# Patient Record
Sex: Male | Born: 1960 | Race: White | Hispanic: No | Marital: Married | Smoking: Never smoker
Health system: Southern US, Community
[De-identification: ages and names within clinical notes are randomized; demographics above are authoritative.]

## PROBLEM LIST (undated history)

## (undated) DIAGNOSIS — K219 Gastro-esophageal reflux disease without esophagitis: Secondary | ICD-10-CM

## (undated) DIAGNOSIS — I10 Essential (primary) hypertension: Secondary | ICD-10-CM

## (undated) DIAGNOSIS — E119 Type 2 diabetes mellitus without complications: Secondary | ICD-10-CM

## (undated) DIAGNOSIS — N189 Chronic kidney disease, unspecified: Secondary | ICD-10-CM

## (undated) HISTORY — PX: CHOLECYSTECTOMY: SHX55

## (undated) HISTORY — PX: BACK SURGERY: SHX140

## (undated) HISTORY — PX: EYE SURGERY: SHX253

## (undated) HISTORY — PX: HERNIA REPAIR: SHX51

---

## 2008-03-17 ENCOUNTER — Ambulatory Visit: Payer: Self-pay | Admitting: Gastroenterology

## 2011-03-03 DIAGNOSIS — F4320 Adjustment disorder, unspecified: Secondary | ICD-10-CM | POA: Insufficient documentation

## 2011-08-21 ENCOUNTER — Ambulatory Visit: Payer: Self-pay | Admitting: Family Medicine

## 2011-08-23 DIAGNOSIS — H269 Unspecified cataract: Secondary | ICD-10-CM | POA: Insufficient documentation

## 2012-04-26 DIAGNOSIS — I2510 Atherosclerotic heart disease of native coronary artery without angina pectoris: Secondary | ICD-10-CM | POA: Insufficient documentation

## 2012-05-07 DIAGNOSIS — R9439 Abnormal result of other cardiovascular function study: Secondary | ICD-10-CM | POA: Insufficient documentation

## 2012-05-30 DIAGNOSIS — N183 Chronic kidney disease, stage 3 unspecified: Secondary | ICD-10-CM | POA: Insufficient documentation

## 2015-10-03 DIAGNOSIS — K859 Acute pancreatitis without necrosis or infection, unspecified: Secondary | ICD-10-CM | POA: Insufficient documentation

## 2015-11-23 DIAGNOSIS — K2940 Chronic atrophic gastritis without bleeding: Secondary | ICD-10-CM | POA: Insufficient documentation

## 2017-07-18 DIAGNOSIS — H2513 Age-related nuclear cataract, bilateral: Secondary | ICD-10-CM | POA: Insufficient documentation

## 2018-01-06 ENCOUNTER — Emergency Department
Admission: EM | Admit: 2018-01-06 | Discharge: 2018-01-06 | Disposition: A | Discharge status: Home / Self Care | Payer: BLUE CROSS/BLUE SHIELD | Attending: Emergency Medicine | Admitting: Emergency Medicine

## 2018-01-06 ENCOUNTER — Other Ambulatory Visit: Payer: Self-pay

## 2018-01-06 ENCOUNTER — Encounter: Payer: Self-pay | Admitting: Emergency Medicine

## 2018-01-06 ENCOUNTER — Emergency Department: Payer: BLUE CROSS/BLUE SHIELD

## 2018-01-06 DIAGNOSIS — R55 Syncope and collapse: Secondary | ICD-10-CM

## 2018-01-06 DIAGNOSIS — E119 Type 2 diabetes mellitus without complications: Secondary | ICD-10-CM | POA: Diagnosis not present

## 2018-01-06 DIAGNOSIS — R0609 Other forms of dyspnea: Secondary | ICD-10-CM | POA: Diagnosis not present

## 2018-01-06 DIAGNOSIS — E869 Volume depletion, unspecified: Secondary | ICD-10-CM | POA: Diagnosis not present

## 2018-01-06 DIAGNOSIS — I10 Essential (primary) hypertension: Secondary | ICD-10-CM | POA: Insufficient documentation

## 2018-01-06 HISTORY — DX: Type 2 diabetes mellitus without complications: E11.9

## 2018-01-06 HISTORY — DX: Essential (primary) hypertension: I10

## 2018-01-06 LAB — CBC WITH DIFFERENTIAL/PLATELET
BASOS ABS: 0 10*3/uL (ref 0–0.1)
BASOS PCT: 0 %
EOS PCT: 1 %
Eosinophils Absolute: 0.1 10*3/uL (ref 0–0.7)
HCT: 38.6 % — ABNORMAL LOW (ref 40.0–52.0)
Hemoglobin: 13.8 g/dL (ref 13.0–18.0)
LYMPHS PCT: 21 %
Lymphs Abs: 1.3 10*3/uL (ref 1.0–3.6)
MCH: 32.9 pg (ref 26.0–34.0)
MCHC: 35.8 g/dL (ref 32.0–36.0)
MCV: 92 fL (ref 80.0–100.0)
MONO ABS: 0.5 10*3/uL (ref 0.2–1.0)
Monocytes Relative: 8 %
Neutro Abs: 4.4 10*3/uL (ref 1.4–6.5)
Neutrophils Relative %: 70 %
Platelets: 151 10*3/uL (ref 150–440)
RBC: 4.19 MIL/uL — ABNORMAL LOW (ref 4.40–5.90)
RDW: 12.4 % (ref 11.5–14.5)
WBC: 6.4 10*3/uL (ref 3.8–10.6)

## 2018-01-06 LAB — LACTIC ACID, PLASMA
Lactic Acid, Venous: 2.3 mmol/L (ref 0.5–1.9)
Lactic Acid, Venous: 1.8 mmol/L (ref 0.5–1.9)

## 2018-01-06 LAB — TROPONIN I
Troponin I: <0.03 ng/mL (ref <0.03)
Troponin I: <0.03 ng/mL (ref <0.03)

## 2018-01-06 LAB — BRAIN NATRIURETIC PEPTIDE: B NATRIURETIC PEPTIDE 5: 23 pg/mL (ref 0.0–100.0)

## 2018-01-06 LAB — CK: Total CK: 140 U/L (ref 49–397)

## 2018-01-06 MED ORDER — SODIUM CHLORIDE 0.9 % IV BOLUS
500.0000 mL | Freq: Once | INTRAVENOUS | Status: AC
  Administered 2018-01-06: 500 mL via INTRAVENOUS

## 2018-01-06 NOTE — Discharge Instructions (Addendum)
You have been seen today in the Emergency Department (ED)  for shortness of breath and near syncope (almost passing out).  Your workup including labs and EKG show reassuring results.  Your symptoms may be due to mild dehydration, so it is important that you drink plenty of non-alcoholic fluids.  Please call your regular doctor as soon as possible to schedule the next available clinic appointment to follow up with him/her regarding your visit to the ED and your symptoms.  Return to the Emergency Department (ED)  if you have any further syncopal episodes (pass out again) or develop ANY chest pain, pressure, tightness, trouble breathing, sudden sweating, or other symptoms that concern you.

## 2018-01-06 NOTE — ED Provider Notes (Signed)
[facility] Emergency Department Provider Note  ____________________________________________   First MD Initiated Contact with Patient 01/06/18 1808     (approximate)  I have reviewed the triage vital signs and the nursing notes.   HISTORY  Chief Complaint Near Syncope    HPI Troy Morse is a 57 y.o. male with medical history as listed below who presents by EMS for evaluation of shortness of breath and near syncope.  He reports that he climbed 2 flights of stairs to do some work on a tenants HVAC system and by the time he got to the top he was very short of breath and felt dizzy and lightheaded like he was going to pass out.  He had associated shortness of breath.  At no point has he had any chest pain.  He reports that the symptoms were severe but got better after EMS arrived and he cooled down and got some IV fluids.  He says that he has been eating and drinking normally but he has been doing some work outside today and yesterday.  He denies any recent illness and specifically denies fever/chills, chest pain, nausea, vomiting, abdominal pain, and dysuria.  Exertion seem to make the symptoms worse and resting and cooling off made it better.  He reports that a couple of years ago he was hospitalized for medication induced pancreatitis.  He reportedly failed a stress test and had a catheterization but did not receive a stent for a reported 35% blockage.  He has not followed up with his [facility] cardiologist since that time.  But he also reports that he does not have regular issues including chest pain  Past Medical History:  Diagnosis Date  . Diabetes mellitus without complication (HCC)   . Hypertension     There are no active problems to display for this patient.   Past Surgical History:  Procedure Laterality Date  . BACK SURGERY    . CHOLECYSTECTOMY    . HERNIA REPAIR      Prior to Admission medications   Not on File    Allergies Patient has no  known allergies.  No family history on file.  Social History Social History   Tobacco Use  . Smoking status: Never Smoker  . Smokeless tobacco: Never Used  Substance Use Topics  . Alcohol use: Not Currently  . Drug use: Never    Review of Systems Constitutional: No fever/chills Eyes: No visual changes. ENT: No sore throat. Cardiovascular: Denies chest pain.  Near syncope after climbing stairs. Respiratory: Shortness of breath with exertion Gastrointestinal: No abdominal pain.  No nausea, no vomiting.  No diarrhea.  No constipation. Genitourinary: Negative for dysuria. Musculoskeletal: Negative for neck pain.  Negative for back pain. Integumentary: Negative for rash. Neurological: Negative for headaches, focal weakness or numbness.   ____________________________________________   PHYSICAL EXAM:  VITAL SIGNS: ED Triage Vitals  Enc Vitals Group     BP 01/06/18 1758 (!) 151/83     Pulse Rate 01/06/18 1758 69     Resp 01/06/18 1758 16     Temp --      Temp src --      SpO2 01/06/18 1758 97 %     Weight 01/06/18 1759 93 kg (205 lb)     Height 01/06/18 1759 1.778 m (5\' 10" )     Head Circumference --      Peak Flow --      Pain Score 01/06/18 1759 0     Pain  Loc --      Pain Edu? --      Excl. in GC? --     Constitutional: Alert and oriented. Well appearing and in no acute distress. Eyes: Conjunctivae are normal.  Head: Atraumatic. Nose: No congestion/rhinnorhea. Mouth/Throat: Mucous membranes are moist. Neck: No stridor.  No meningeal signs.   Cardiovascular: Normal rate, regular rhythm. Good peripheral circulation. Grossly normal heart sounds. Respiratory: Normal respiratory effort.  No retractions. Lungs CTAB. Gastrointestinal: Soft and nontender. No distention.  Musculoskeletal: No lower extremity tenderness nor edema. No gross deformities of extremities. Neurologic:  Normal speech and language. No gross focal neurologic deficits are appreciated.  Skin:   Skin is warm, dry and intact. No rash noted. Psychiatric: Mood and affect are normal. Speech and behavior are normal.  ____________________________________________   LABS (all labs ordered are listed, but only abnormal results are displayed)  Labs Reviewed  CBC WITH DIFFERENTIAL/PLATELET - Abnormal; Notable for the following components:      Result Value   RBC 4.19 (*)    HCT 38.6 (*)    All other components within normal limits  LACTIC ACID, PLASMA - Abnormal; Notable for the following components:   Lactic Acid, Venous 2.3 (*)    All other components within normal limits  TROPONIN I  CK  BRAIN NATRIURETIC PEPTIDE  LACTIC ACID, PLASMA  TROPONIN I   ____________________________________________  EKG  ED ECG REPORT I, Loleta Rose, the attending physician, personally viewed and interpreted this ECG.  Date: 01/06/2018 EKG Time: 17: 52 Rate: 67 Rhythm: normal sinus rhythm QRS Axis: normal Intervals: normal ST/T Wave abnormalities: Patient has some ST segment elevation in leads I, 2, V4, but does not meet criteria for STEMI. Narrative Interpretation: no evidence of acute ischemia   ____________________________________________  RADIOLOGY I, Loleta Rose, personally viewed and evaluated these images (plain radiographs) as part of my medical decision making, as well as reviewing the written report by the radiologist.  ED MD interpretation: No indication of intrathoracic abnormality on chest x-ray  Official radiology report(s): Dg Chest Portable 1 View  Result Date: 01/06/2018 CLINICAL DATA:  Shortness of breath EXAM: PORTABLE CHEST 1 VIEW COMPARISON:  08/21/2011 FINDINGS: Heart is borderline in size. Low lung volumes with minimal bibasilar atelectasis. No effusions or edema. No acute bony abnormality. IMPRESSION: Borderline heart size.  Low lung volumes with bibasilar atelectasis. Electronically Signed   By: Charlett Nose M.D.   On: 01/06/2018 19:04     ____________________________________________   PROCEDURES  Critical Care performed: No   Procedure(s) performed:   Procedures   ____________________________________________   INITIAL IMPRESSION / ASSESSMENT AND PLAN / ED COURSE  As part of my medical decision making, I reviewed the following data within the electronic MEDICAL RECORD NUMBER Nursing notes reviewed and incorporated, Labs reviewed , EKG interpreted , Old chart reviewed and Radiograph reviewed     Differential diagnosis includes, but is not limited to, volume depletion/dehydration, ACS, pneumonia, PE, electrolyte or metabolic abnormality.  The patient appears volume depleted to me although ACS is also certainly possible.  I anticipate his lactic acid may be slightly elevated but he has no signs or symptoms of sepsis and no recent infectious symptoms.  He does not meet sepsis criteria.  He has some nonspecific changes in the EKG which I do not feel represents acute infection or inflammation; even though the computer is reading Global ST segment elevation consistent with pericarditis, he has no signs or symptoms of pericarditis, and I think that  he has some early repolarization but no evidence of acute ischemia.  The patient feels much better after getting some IV fluids.  I will continue the fluids and check extensive lab work but I believe that his symptoms are the result of volume depletion at this time.  He understands and agrees with the plan for at least 2 troponins and continued lab work-up and observation, likely discharge home.  He reports that [facility] with whom he would like to follow-up.  Clinical Course as of Jan 08 27  Wynelle LinkSun Jan 06, 2018  1852 Lactic Acid, Venous(!!): 2.3 [CF]  1852 CK Total: 140 [CF]  1852 Troponin I: <0.03 [CF]  2026 No acute abnormalities  DG Chest Portable 1 View [CF]  2135 Lactic Acid, Venous: 1.8 [CF]  2135 Troponin I: <0.03 [CF]  2142 B Natriuretic Peptide: 23.0  [CF]  2144 The patient has been asymptomatic for 4 hours in the emergency department.  His repeat lactic acid was normal and his second troponin was negative.  Every now and then on the monitor he has had some PVCs that appears almost as a trigeminy pattern.  I informed him and his wife of this but explained that this is generally not dangerous although it could have contributed to his shortness of breath and therapy.  He and his wife promised that they will call his cardiologist tomorrow and I am also providing the name and number of a local cardiologist with whom he can follow-up.  He is comfortable with the plan and I see no acute or emergent reason to keep him in the hospital tonight.  I gave strict return precautions and he and his wife understand and agree with the plan.   [CF]    Clinical Course User Index [CF] Loleta RoseForbach, Jolina Symonds, MD    ____________________________________________  FINAL CLINICAL IMPRESSION(S) / ED DIAGNOSES  Final diagnoses:  Near syncope  Dyspnea on exertion  Volume depletion     MEDICATIONS GIVEN DURING THIS VISIT:  Medications  sodium chloride 0.9 % bolus 500 mL (0 mLs Intravenous Stopped 01/06/18 1926)  sodium chloride 0.9 % bolus 500 mL (0 mLs Intravenous Stopped 01/06/18 2030)     ED Discharge Orders    None       Note:  This document was prepared using Dragon voice recognition software and may include unintentional dictation errors.    Loleta RoseForbach, Shalie Schremp, MD 01/07/18 Jacinta [id number]

## 2018-01-06 NOTE — ED Triage Notes (Signed)
Pt arrives via [facility] after going to check on a tenant's HVAC system. Pt reportedly was climbing two flights of stairs when he suddenly felt as though he could not catch his breath, got lightheaded and felt like he was going to pass out. No chest pain at that time. Denies chest pain at this time.

## 2018-05-06 ENCOUNTER — Other Ambulatory Visit: Payer: Self-pay

## 2018-05-06 ENCOUNTER — Encounter: Payer: Self-pay | Admitting: *Deleted

## 2018-05-06 ENCOUNTER — Emergency Department
Admission: EM | Admit: 2018-05-06 | Discharge: 2018-05-07 | Disposition: A | Discharge status: Other Acute Inpatient Hospital | Payer: BLUE CROSS/BLUE SHIELD | Attending: Emergency Medicine | Admitting: Emergency Medicine

## 2018-05-06 DIAGNOSIS — R456 Violent behavior: Secondary | ICD-10-CM | POA: Diagnosis not present

## 2018-05-06 DIAGNOSIS — R45851 Suicidal ideations: Secondary | ICD-10-CM | POA: Insufficient documentation

## 2018-05-06 DIAGNOSIS — R45850 Homicidal ideations: Secondary | ICD-10-CM | POA: Diagnosis not present

## 2018-05-06 DIAGNOSIS — F419 Anxiety disorder, unspecified: Secondary | ICD-10-CM | POA: Insufficient documentation

## 2018-05-06 DIAGNOSIS — Z046 Encounter for general psychiatric examination, requested by authority: Secondary | ICD-10-CM | POA: Insufficient documentation

## 2018-05-06 DIAGNOSIS — F329 Major depressive disorder, single episode, unspecified: Secondary | ICD-10-CM | POA: Insufficient documentation

## 2018-05-06 DIAGNOSIS — E119 Type 2 diabetes mellitus without complications: Secondary | ICD-10-CM | POA: Diagnosis not present

## 2018-05-06 DIAGNOSIS — I10 Essential (primary) hypertension: Secondary | ICD-10-CM | POA: Diagnosis not present

## 2018-05-06 LAB — COMPREHENSIVE METABOLIC PANEL
ALBUMIN: 4.5 g/dL (ref 3.5–5.0)
ALK PHOS: 62 U/L (ref 38–126)
ALT: 26 U/L (ref 0–44)
ANION GAP: 11 (ref 5–15)
AST: 23 U/L (ref 15–41)
BILIRUBIN TOTAL: 0.7 mg/dL (ref 0.3–1.2)
BUN: 19 mg/dL (ref 6–20)
CO2: 27 mmol/L (ref 22–32)
Calcium: 9.7 mg/dL (ref 8.9–10.3)
Chloride: 100 mmol/L (ref 98–111)
Creatinine, Ser: 1.35 mg/dL — ABNORMAL HIGH (ref 0.61–1.24)
GFR calc Af Amer: >60 mL/min (ref >60)
GFR calc non Af Amer: 56 mL/min — ABNORMAL LOW (ref >60)
GLUCOSE: 275 mg/dL — AB (ref 70–99)
POTASSIUM: 4.4 mmol/L (ref 3.5–5.1)
SODIUM: 138 mmol/L (ref 135–145)
TOTAL PROTEIN: 8 g/dL (ref 6.5–8.1)

## 2018-05-06 LAB — CBC
HEMATOCRIT: 48.4 % (ref 39.0–52.0)
HEMOGLOBIN: 16.7 g/dL (ref 13.0–17.0)
MCH: 31.2 pg (ref 26.0–34.0)
MCHC: 34.5 g/dL (ref 30.0–36.0)
MCV: 90.3 fL (ref 80.0–100.0)
NRBC: 0 % (ref 0.0–0.2)
Platelets: 162 10*3/uL (ref 150–400)
RBC: 5.36 MIL/uL (ref 4.22–5.81)
RDW: 11.6 % (ref 11.5–15.5)
WBC: 7 10*3/uL (ref 4.0–10.5)

## 2018-05-06 LAB — ACETAMINOPHEN LEVEL

## 2018-05-06 LAB — SALICYLATE LEVEL: Salicylate Lvl: <7 mg/dL (ref 2.8–30.0)

## 2018-05-06 LAB — ETHANOL: Alcohol, Ethyl (B): <10 mg/dL (ref <10)

## 2018-05-06 NOTE — ED Triage Notes (Signed)
[place].  Pt is IVC.   Pt denies SI or HI.  Pt denies etoh use or drug use.  Pt calm and cooperative

## 2018-05-07 LAB — URINE DRUG SCREEN, QUALITATIVE (ARMC ONLY)
Amphetamines, Ur Screen: NOT DETECTED
BENZODIAZEPINE, UR SCRN: POSITIVE — AB
Barbiturates, Ur Screen: NOT DETECTED
CANNABINOID 50 NG, UR ~~LOC~~: NOT DETECTED
Cocaine Metabolite,Ur ~~LOC~~: NOT DETECTED
MDMA (Ecstasy)Ur Screen: NOT DETECTED
Methadone Scn, Ur: NOT DETECTED
Opiate, Ur Screen: NOT DETECTED
PHENCYCLIDINE (PCP) UR S: NOT DETECTED
TRICYCLIC, UR SCREEN: NOT DETECTED

## 2018-05-07 LAB — GLUCOSE, CAPILLARY: GLUCOSE-CAPILLARY: 124 mg/dL — AB (ref 70–99)

## 2018-05-07 NOTE — ED Notes (Signed)
EMTALA reviewed by this RN.  

## 2018-05-07 NOTE — ED Notes (Signed)
Hourly rounding reveals patient in room. No complaints, stable, in no acute distress. Q15 minute rounds and monitoring via Rover and Officer to continue.   

## 2018-05-07 NOTE — ED Notes (Signed)
Hourly rounding reveals patient sleeping in room. No complaints, stable, in no acute distress. Q15 minute rounds and monitoring via Security Cameras to continue. 
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
## 2018-05-07 NOTE — ED Provider Notes (Signed)
   I reviewed Dr. Scharlene CornForbach's note which was initially noting reevaluation by Dr. Toni Amendlapacs (in person psychiatrist) - but there is no in person psychiatrist available.   I reviewed the specialist on-call psychiatry evaluation which recommends inpatient psychiatric treatment.  I have discussed with TTS who will present the patient for inpatient admission, [facility].     Governor RooksLord, Roopa Graver, MD 05/07/18 [phone]

## 2018-05-07 NOTE — BH Assessment (Signed)
Referral information for Psychiatric Hospitalization faxed to;   [facility] Kitchen. [facility] [phone]),   . [facility] [phone]),  . [facility] [phone], [phone] or [phone]),   . [place] [phone]  . [facility] [phone]),   . [facility] [phone]),   . [facility] [phone] or [phone])  . [facility] [phone]  . [facility] [phone])
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
## 2018-05-07 NOTE — ED Provider Notes (Signed)
TTS let me know patient was accepted to [facility] for psychiatric transfer.   Governor RooksLord, Roosevelt Eimers, MD 05/07/18 [phone]

## 2018-05-07 NOTE — ED Notes (Signed)
Pt accepted to [facility]/ Waiting on [facility] to transport

## 2018-05-07 NOTE — BH Assessment (Signed)
This Clinical research associatewriter spoke to SPX CorporationKerstin, Charity fundraiserN (intake RN) with [facility] who reports they are willing to accept pt for admission to their facility; however, they are requesting updated vitals be faxed to [phone]. Apparently, pt's b/p was elevated and they want to ensure he's medically cleared.

## 2018-05-07 NOTE — BH Assessment (Signed)
Patient has been accepted to [facility].  [facility]. Accepting physician is Dr. Ronalee RedLeah Fryml.  Call report to [phone].  Representative was SPX CorporationKerstin.   ER Staff is aware of it:  Glenda, ER Secretary  Dr. Shaune PollackLord, ER MD  Isaiah BlakesJerrie, Patient's Nurse     Patient's Family/Support System [place]: [phone]) have been updated as well.

## 2018-05-07 NOTE — ED Notes (Signed)
Patient reports feeling anxious about hospitalization. He is updated on plans for transfer, and at this time is cooperative and understands the necessity for treatment. He is provided a phone to call family.

## 2018-05-07 NOTE — ED Notes (Signed)
Pt making phone calls at this time

## 2018-05-07 NOTE — ED Notes (Signed)
TTS at bedside. 

## 2018-05-07 NOTE — ED Notes (Signed)
Hourly rounding reveals patient in room. No complaints, stable, in no acute distress. Q15 minute rounds and monitoring via Security Cameras to continue. 

## 2018-05-07 NOTE — ED Notes (Signed)
Pt. Transferred to [facility] from ED to room 3 after screening for contraband. Report to include Situation, Background, Assessment and Recommendations from Hewan RN. Pt. Oriented to unit including Q15 minute rounds as well as the security cameras for their protection. Patient is alert and oriented, warm and dry in no acute distress. Patient denies SI, HI, and AVH. Pt. Encouraged to let me know if needs arise. 

## 2018-07-12 ENCOUNTER — Ambulatory Visit (INDEPENDENT_AMBULATORY_CARE_PROVIDER_SITE_OTHER): Payer: BLUE CROSS/BLUE SHIELD | Admitting: Urology

## 2018-07-12 ENCOUNTER — Encounter: Payer: Self-pay | Admitting: Urology

## 2018-07-12 VITALS — BP 159/89 | HR 74 | Ht 70.0 in | Wt 215.0 lb

## 2018-07-12 DIAGNOSIS — N5201 Erectile dysfunction due to arterial insufficiency: Secondary | ICD-10-CM | POA: Diagnosis not present

## 2018-07-13 ENCOUNTER — Encounter: Payer: Self-pay | Admitting: Urology

## 2018-07-13 NOTE — Progress Notes (Signed)
07/12/2018 10:11 PM   Troy Morse 02-16-1961 [id number]  Referring provider: Verdis Prime, MD [address] [place], [place] [address]  Chief Complaint  Patient presents with  . New Patient (Initial Visit)    erectile dysfunction    HPI: 58 year-old male seen for evaluation of erectile dysfunction.  He presents with a 3-4-year history of progressively worsening erectile dysfunction.  He complains of difficulty achieving an erection.  He has partial erections and can typically achieve penetration with lubrication.  He denies pain with erections.  When he was having rigid erections he did note slight upward curvature.  He has tried both Viagra and Cialis and saw no improvement.  He has also used a vacuum erection device however states the compression band was uncomfortable.  Organic risk factors include diabetes, hypertension, antihypertensive medications including lisinopril, metoprolol.  There is no previous history of tobacco use.   PMH: Past Medical History:  Diagnosis Date  . Diabetes mellitus without complication ([facility])   . Hypertension     Surgical History: Past Surgical History:  Procedure Laterality Date  . BACK SURGERY    . CHOLECYSTECTOMY    . HERNIA REPAIR      Home Medications:  Allergies as of 07/12/2018      Reactions   Liraglutide Other (See Comments)   pancreatitis pancreatitis   Empagliflozin Other (See Comments)   Pancreatitis Pancreatitis      Medication List       Accurate as of July 12, 2018 11:59 PM. Always use your most recent med list.        aspirin EC 81 MG tablet Take 81 mg by mouth daily.   gabapentin 300 MG capsule Commonly known as:  NEURONTIN Take 300 mg by mouth every evening.   latanoprost 0.005 % ophthalmic solution Commonly known as:  XALATAN Place 1 drop into both eyes at bedtime.   lisinopril 20 MG tablet Commonly known as:  PRINIVIL,ZESTRIL Take 10 mg by mouth daily.   Melatonin 5 MG  Chew Chew 5 mg by mouth at bedtime as needed for sleep.   metFORMIN 500 MG tablet Commonly known as:  GLUCOPHAGE Take 1,000 mg by mouth daily with breakfast.   metoprolol succinate 25 MG 24 hr tablet Commonly known as:  TOPROL-XL Take 25 mg by mouth daily.   NOVOLOG FLEXPEN 100 UNIT/ML FlexPen Generic drug:  insulin aspart Inject 0-14 Units into the skin See admin instructions. Inject under the skin 3 times daily at mealtimes according to sliding scale:  100-150: 0u 151-200: 5u 201-250: 8u 251-300: 10u 301-350: 12u >351: 14u   pravastatin 80 MG tablet Commonly known as:  PRAVACHOL Take 80 mg by mouth daily.   sertraline 100 MG tablet Commonly known as:  ZOLOFT Take 100 mg by mouth daily.   TRESIBA FLEXTOUCH 200 UNIT/ML Sopn Generic drug:  Insulin Degludec Inject 80 Units into the skin every evening.       Allergies:  Allergies  Allergen Reactions  . Liraglutide Other (See Comments)    pancreatitis pancreatitis   . Empagliflozin Other (See Comments)    Pancreatitis Pancreatitis     Family History: No family history on file.  Social History:  reports that he has never smoked. He has never used smokeless tobacco. He reports previous alcohol use. He reports that he does not use drugs.  ROS: UROLOGY Frequent Urination?: No Hard to postpone urination?: No Burning/pain with urination?: No Get up at night to urinate?: No  Leakage of urine?: No Urine stream starts and stops?: No Trouble starting stream?: No Do you have to strain to urinate?: No Blood in urine?: No Urinary tract infection?: No Sexually transmitted disease?: No Injury to kidneys or bladder?: No Painful intercourse?: No Weak stream?: No Erection problems?: Yes Penile pain?: No  Gastrointestinal Nausea?: No Vomiting?: No Indigestion/heartburn?: No Diarrhea?: No Constipation?: No  Constitutional Fever: No Night sweats?: No Weight loss?: No Fatigue?: No  Skin Skin rash/lesions?:  No Itching?: No  Eyes Blurred vision?: No Double vision?: No  Ears/Nose/Throat Sore throat?: No Sinus problems?: No  Hematologic/Lymphatic Swollen glands?: No Easy bruising?: No  Cardiovascular Leg swelling?: No Chest pain?: No  Respiratory Cough?: No Shortness of breath?: No  Endocrine Excessive thirst?: No  Musculoskeletal Back pain?: No Joint pain?: No  Neurological Headaches?: No Dizziness?: No  Psychologic Depression?: No Anxiety?: No  Physical Exam: BP (!) 159/89 (BP Location: Left Arm, Patient Position: Sitting)   Pulse 74   Ht 5\' 10"  (1.778 m)   Wt 215 lb (97.5 kg)   BMI 30.85 kg/m   Constitutional:  Alert and oriented, No acute distress. HEENT: [place] AT, moist mucus membranes.  Trachea midline, no masses. Cardiovascular: No clubbing, cyanosis, or edema. Respiratory: Normal respiratory effort, no increased work of breathing. GI: Abdomen is soft, nontender, nondistended, no abdominal masses GU: No CVA tenderness.  Penis is without lesions.  Mid shaft dorsal penile plaque palpable.  Testes descended bilaterally without masses or tenderness.  Cords/epididymis palpably normal. Lymph: No cervical or inguinal lymphadenopathy. Skin: No rashes, bruises or suspicious lesions. Neurologic: Grossly intact, no focal deficits, moving all 4 extremities. Psychiatric: Normal mood and affect.   Assessment & Plan:   58 year old male with erectile dysfunction.  He has failed PDE 5 inhibitors and was unable to tolerate a vacuum erection device.  He was primarily interested in intracavernosal injections.  We discussed potential risks including priapism and corporal scarring.  It is not recommended to use more than 2-3 times weekly.  He will follow-up for injection training.  Rx Trimix was faxed to [facility].    Riki Altes, MD  [facility] [address], [address] [place], [place] [address] [phone]

## 2018-07-17 ENCOUNTER — Ambulatory Visit (INDEPENDENT_AMBULATORY_CARE_PROVIDER_SITE_OTHER): Payer: BLUE CROSS/BLUE SHIELD | Admitting: Urology

## 2018-07-17 ENCOUNTER — Ambulatory Visit: Payer: BLUE CROSS/BLUE SHIELD | Admitting: Urology

## 2018-07-17 ENCOUNTER — Encounter: Payer: Self-pay | Admitting: Urology

## 2018-07-17 VITALS — BP 147/89 | HR 84 | Ht 70.0 in | Wt 215.0 lb

## 2018-07-17 DIAGNOSIS — N5201 Erectile dysfunction due to arterial insufficiency: Secondary | ICD-10-CM

## 2018-07-17 NOTE — Progress Notes (Signed)
   07/17/2018 9:23 AM  Troy Morse 1960-09-30 [id number]  Referring provider: Verdis Prime, MD [address] [place], [place] [address]  Chief Complaint  Patient presents with  . Erectile Dysfunction    Trimix training   HPI: Troy Morse is a 58 yo M who returns today for the evaluation and management of erectile dysfunction.   Today, pt wanted details on Trimix procedure prior to obtaining injection. He reported that he has not had spontaneous erections for approximately 10 years. He denies a significant curve with firm erection. He denies sickle cell disease and denies use of seroquel or trazodone.   His last visit with [place] was on 07/12/2018 where he presented with a 3-4 year history of progressively worsening erectile dysfunction. He was able to obtain partial erections and can typically achieve penetration with lubrication. He denied pain with erections. He noted slight upward curvature with rigid erections. He has tried [place] and Cialis which were ineffective. He also used a vacuum erection device however states the compression band was uncomfortable.         Organic risk factors include diabetes, hypertension, antihypertensive medications including lisinopril,  metoprolol.             There is no previous history of tobacco use.   Physical Exam:  BP (!) 147/89 (BP Location: Left Arm, Patient Position: Sitting)   Pulse 84   Ht 5\' 10"  (1.778 m)   Wt 215 lb (97.5 kg)   BMI 30.85 kg/m   Constitutional:  Well nourished. Alert and oriented, No acute distress. GU: No CVA tenderness.  No bladder fullness or masses.  Patient with circumcised Urethral meatus is patent.  No penile discharge. No penile lesions or rashes  Procedure  Patient's left corpus cavernosum is identified.  An area near the base of the penis is cleansed with rubbing alcohol.  Careful to avoid the dorsal vein, 3 mcg of Trimix (papaverine 30 mg, phentolamine 1 mg and prostaglandin E1 10 mcg,  Lot # [id number]  exp # 07/19/2018 is injected at a 90 degree angle into the left corpus cavernosum near the base of the penis.  Patient experienced a very firm erection in 15 minutes.    Assessment & Plan:    1. Erectile dysfunction -PDE 5 inhibitors ineffective and was unable to tolerate a vacuum erection device; interested in intracavernosal injections -Discussed potential risks including priapism and corporal scaring; Not recommended to use more than 2-3x weekly -Trimix injection teaching today; Trimix injection gave a  satisfactory firm erection; pt pleased   Return for patient to call with results .  09/17/2018  [facility] [address] [place], [place] [phone]  I, [phone], am acting as a [place] for Neurosurgeon,  I have reviewed the above documentation for accuracy and completeness, and I agree with the above.    [employer], PA-C   I spent 25 minutes with this patient in a face to face visit  of which greater than 50% was spent in counseling and coordination of care with the patient regarding ED.

## 2018-10-01 ENCOUNTER — Other Ambulatory Visit: Payer: Self-pay

## 2018-10-01 DIAGNOSIS — N5201 Erectile dysfunction due to arterial insufficiency: Secondary | ICD-10-CM

## 2018-10-01 MED ORDER — NONFORMULARY OR COMPOUNDED ITEM: 3 refills | Status: AC

## 2018-10-01 NOTE — Telephone Encounter (Signed)
Incoming request for refill on Trimix.

## 2018-12-11 ENCOUNTER — Other Ambulatory Visit: Payer: Self-pay

## 2018-12-11 ENCOUNTER — Inpatient Hospital Stay
Admission: EM | Admit: 2018-12-11 | Discharge: 2018-12-15 | DRG: 988 | Disposition: A | Discharge status: Home / Self Care | Payer: BC Managed Care – PPO | Attending: Internal Medicine | Admitting: Internal Medicine

## 2018-12-11 ENCOUNTER — Encounter: Payer: Self-pay | Admitting: Emergency Medicine

## 2018-12-11 ENCOUNTER — Inpatient Hospital Stay: Payer: BC Managed Care – PPO

## 2018-12-11 ENCOUNTER — Emergency Department: Payer: BC Managed Care – PPO

## 2018-12-11 DIAGNOSIS — H409 Unspecified glaucoma: Secondary | ICD-10-CM | POA: Diagnosis present

## 2018-12-11 DIAGNOSIS — E11621 Type 2 diabetes mellitus with foot ulcer: Secondary | ICD-10-CM | POA: Diagnosis present

## 2018-12-11 DIAGNOSIS — L97519 Non-pressure chronic ulcer of other part of right foot with unspecified severity: Secondary | ICD-10-CM

## 2018-12-11 DIAGNOSIS — I2510 Atherosclerotic heart disease of native coronary artery without angina pectoris: Secondary | ICD-10-CM | POA: Diagnosis present

## 2018-12-11 DIAGNOSIS — E11319 Type 2 diabetes mellitus with unspecified diabetic retinopathy without macular edema: Secondary | ICD-10-CM | POA: Diagnosis present

## 2018-12-11 DIAGNOSIS — E11628 Type 2 diabetes mellitus with other skin complications: Secondary | ICD-10-CM | POA: Diagnosis present

## 2018-12-11 DIAGNOSIS — E13621 Other specified diabetes mellitus with foot ulcer: Secondary | ICD-10-CM

## 2018-12-11 DIAGNOSIS — E1140 Type 2 diabetes mellitus with diabetic neuropathy, unspecified: Secondary | ICD-10-CM | POA: Diagnosis present

## 2018-12-11 DIAGNOSIS — E785 Hyperlipidemia, unspecified: Secondary | ICD-10-CM | POA: Diagnosis present

## 2018-12-11 DIAGNOSIS — Z79899 Other long term (current) drug therapy: Secondary | ICD-10-CM

## 2018-12-11 DIAGNOSIS — Z8249 Family history of ischemic heart disease and other diseases of the circulatory system: Secondary | ICD-10-CM

## 2018-12-11 DIAGNOSIS — E1169 Type 2 diabetes mellitus with other specified complication: Secondary | ICD-10-CM | POA: Diagnosis present

## 2018-12-11 DIAGNOSIS — F329 Major depressive disorder, single episode, unspecified: Secondary | ICD-10-CM | POA: Diagnosis present

## 2018-12-11 DIAGNOSIS — I129 Hypertensive chronic kidney disease with stage 1 through stage 4 chronic kidney disease, or unspecified chronic kidney disease: Secondary | ICD-10-CM | POA: Diagnosis present

## 2018-12-11 DIAGNOSIS — K5900 Constipation, unspecified: Secondary | ICD-10-CM | POA: Diagnosis present

## 2018-12-11 DIAGNOSIS — Z794 Long term (current) use of insulin: Secondary | ICD-10-CM

## 2018-12-11 DIAGNOSIS — I10 Essential (primary) hypertension: Secondary | ICD-10-CM | POA: Diagnosis not present

## 2018-12-11 DIAGNOSIS — M109 Gout, unspecified: Secondary | ICD-10-CM | POA: Diagnosis present

## 2018-12-11 DIAGNOSIS — Z1159 Encounter for screening for other viral diseases: Secondary | ICD-10-CM

## 2018-12-11 DIAGNOSIS — Z7982 Long term (current) use of aspirin: Secondary | ICD-10-CM

## 2018-12-11 DIAGNOSIS — E11622 Type 2 diabetes mellitus with other skin ulcer: Secondary | ICD-10-CM | POA: Diagnosis present

## 2018-12-11 DIAGNOSIS — L089 Local infection of the skin and subcutaneous tissue, unspecified: Secondary | ICD-10-CM

## 2018-12-11 DIAGNOSIS — N183 Chronic kidney disease, stage 3 (moderate): Secondary | ICD-10-CM | POA: Diagnosis present

## 2018-12-11 DIAGNOSIS — M86171 Other acute osteomyelitis, right ankle and foot: Secondary | ICD-10-CM | POA: Diagnosis not present

## 2018-12-11 DIAGNOSIS — Z888 Allergy status to other drugs, medicaments and biological substances status: Secondary | ICD-10-CM | POA: Diagnosis not present

## 2018-12-11 DIAGNOSIS — E1122 Type 2 diabetes mellitus with diabetic chronic kidney disease: Secondary | ICD-10-CM | POA: Diagnosis present

## 2018-12-11 DIAGNOSIS — Z825 Family history of asthma and other chronic lower respiratory diseases: Secondary | ICD-10-CM

## 2018-12-11 LAB — URINALYSIS, COMPLETE (UACMP) WITH MICROSCOPIC
Bacteria, UA: NONE SEEN
Bilirubin Urine: NEGATIVE
Glucose, UA: NEGATIVE mg/dL
Hgb urine dipstick: NEGATIVE
Ketones, ur: NEGATIVE mg/dL
Leukocytes,Ua: NEGATIVE
Nitrite: NEGATIVE
Protein, ur: NEGATIVE mg/dL
Specific Gravity, Urine: 1.018 (ref 1.005–1.030)
Squamous Epithelial / LPF: NONE SEEN (ref 0–5)
pH: 5 (ref 5.0–8.0)

## 2018-12-11 LAB — CBC WITH DIFFERENTIAL/PLATELET
Abs Immature Granulocytes: 0.04 10*3/uL (ref 0.00–0.07)
Basophils Absolute: 0 10*3/uL (ref 0.0–0.1)
Basophils Relative: 0 %
Eosinophils Absolute: 0.1 10*3/uL (ref 0.0–0.5)
Eosinophils Relative: 1 %
HCT: 39 % (ref 39.0–52.0)
Hemoglobin: 13 g/dL (ref 13.0–17.0)
Immature Granulocytes: 0 %
Lymphocytes Relative: 12 %
Lymphs Abs: 1.2 10*3/uL (ref 0.7–4.0)
MCH: 31.1 pg (ref 26.0–34.0)
MCHC: 33.3 g/dL (ref 30.0–36.0)
MCV: 93.3 fL (ref 80.0–100.0)
Monocytes Absolute: 0.9 10*3/uL (ref 0.1–1.0)
Monocytes Relative: 10 %
Neutro Abs: 7.3 10*3/uL (ref 1.7–7.7)
Neutrophils Relative %: 77 %
Platelets: 193 10*3/uL (ref 150–400)
RBC: 4.18 MIL/uL — ABNORMAL LOW (ref 4.22–5.81)
RDW: 11.8 % (ref 11.5–15.5)
WBC: 9.6 10*3/uL (ref 4.0–10.5)
nRBC: 0 % (ref 0.0–0.2)

## 2018-12-11 LAB — COMPREHENSIVE METABOLIC PANEL
ALT: 18 U/L (ref 0–44)
AST: 20 U/L (ref 15–41)
Albumin: 3.7 g/dL (ref 3.5–5.0)
Alkaline Phosphatase: 69 U/L (ref 38–126)
Anion gap: 11 (ref 5–15)
BUN: 30 mg/dL — ABNORMAL HIGH (ref 6–20)
CO2: 22 mmol/L (ref 22–32)
Calcium: 9.1 mg/dL (ref 8.9–10.3)
Chloride: 104 mmol/L (ref 98–111)
Creatinine, Ser: 1.47 mg/dL — ABNORMAL HIGH (ref 0.61–1.24)
GFR calc Af Amer: >60 mL/min (ref >60)
GFR calc non Af Amer: 52 mL/min — ABNORMAL LOW (ref >60)
Glucose, Bld: 148 mg/dL — ABNORMAL HIGH (ref 70–99)
Potassium: 4.6 mmol/L (ref 3.5–5.1)
Sodium: 137 mmol/L (ref 135–145)
Total Bilirubin: 0.6 mg/dL (ref 0.3–1.2)
Total Protein: 7.8 g/dL (ref 6.5–8.1)

## 2018-12-11 LAB — GLUCOSE, CAPILLARY
Glucose-Capillary: 132 mg/dL — ABNORMAL HIGH (ref 70–99)
Glucose-Capillary: 344 mg/dL — ABNORMAL HIGH (ref 70–99)

## 2018-12-11 LAB — SARS CORONAVIRUS 2 BY RT PCR (HOSPITAL ORDER, PERFORMED IN ~~LOC~~ HOSPITAL LAB): SARS Coronavirus 2: NEGATIVE

## 2018-12-11 LAB — HEMOGLOBIN A1C
Hgb A1c MFr Bld: 11 % — ABNORMAL HIGH (ref 4.8–5.6)
Mean Plasma Glucose: 269 mg/dL

## 2018-12-11 LAB — LACTIC ACID, PLASMA: Lactic Acid, Venous: 1.4 mmol/L (ref 0.5–1.9)

## 2018-12-11 MED ORDER — KETOROLAC TROMETHAMINE 30 MG/ML IJ SOLN
30.0000 mg | Freq: Four times a day (QID) | INTRAMUSCULAR | Status: DC | PRN
  Administered 2018-12-11 – 2018-12-12 (×3): 30 mg via INTRAVENOUS
  Filled 2018-12-11 (×3): qty 1

## 2018-12-11 MED ORDER — ENOXAPARIN SODIUM 40 MG/0.4ML ~~LOC~~ SOLN
40.0000 mg | SUBCUTANEOUS | Status: DC
  Administered 2018-12-11 – 2018-12-14 (×4): 40 mg via SUBCUTANEOUS
  Filled 2018-12-11 (×4): qty 0.4

## 2018-12-11 MED ORDER — ONDANSETRON HCL 4 MG PO TABS: 4.0000 mg | ORAL_TABLET | Freq: Four times a day (QID) | ORAL | Status: DC | PRN

## 2018-12-11 MED ORDER — PRAVASTATIN SODIUM 20 MG PO TABS
80.0000 mg | ORAL_TABLET | Freq: Every day | ORAL | Status: DC
  Administered 2018-12-12 – 2018-12-14 (×2): 80 mg via ORAL
  Filled 2018-12-11 (×2): qty 4

## 2018-12-11 MED ORDER — GABAPENTIN 300 MG PO CAPS
300.0000 mg | ORAL_CAPSULE | Freq: Every evening | ORAL | Status: DC
  Administered 2018-12-11 – 2018-12-14 (×4): 300 mg via ORAL
  Filled 2018-12-11 (×4): qty 1

## 2018-12-11 MED ORDER — LATANOPROST 0.005 % OP SOLN
1.0000 [drp] | Freq: Every day | OPHTHALMIC | Status: DC
  Administered 2018-12-11 – 2018-12-14 (×4): 1 [drp] via OPHTHALMIC
  Filled 2018-12-11: qty 2.5

## 2018-12-11 MED ORDER — MELATONIN 5 MG PO TABS
10.0000 mg | ORAL_TABLET | Freq: Every evening | ORAL | Status: DC | PRN
  Administered 2018-12-11 – 2018-12-13 (×3): 10 mg via ORAL
  Filled 2018-12-11 (×5): qty 2

## 2018-12-11 MED ORDER — PIPERACILLIN-TAZOBACTAM 3.375 G IVPB 30 MIN
3.3750 g | Freq: Once | INTRAVENOUS | Status: AC
  Administered 2018-12-11: 3.375 g via INTRAVENOUS
  Filled 2018-12-11 (×2): qty 50

## 2018-12-11 MED ORDER — VANCOMYCIN HCL 10 G IV SOLR
2000.0000 mg | Freq: Once | INTRAVENOUS | Status: AC
  Administered 2018-12-11: 2000 mg via INTRAVENOUS
  Filled 2018-12-11: qty 2000

## 2018-12-11 MED ORDER — NON FORMULARY: 10.0000 mg | Freq: Every evening | Status: DC | PRN

## 2018-12-11 MED ORDER — ACETAMINOPHEN 325 MG PO TABS
650.0000 mg | ORAL_TABLET | Freq: Four times a day (QID) | ORAL | Status: DC | PRN
  Administered 2018-12-11 – 2018-12-12 (×4): 650 mg via ORAL
  Filled 2018-12-11 (×4): qty 2

## 2018-12-11 MED ORDER — LISINOPRIL 10 MG PO TABS
10.0000 mg | ORAL_TABLET | Freq: Every day | ORAL | Status: DC
  Administered 2018-12-11 – 2018-12-15 (×5): 10 mg via ORAL
  Filled 2018-12-11 (×5): qty 1

## 2018-12-11 MED ORDER — METFORMIN HCL 500 MG PO TABS
1000.0000 mg | ORAL_TABLET | Freq: Every day | ORAL | Status: DC
  Administered 2018-12-11 – 2018-12-14 (×4): 1000 mg via ORAL
  Filled 2018-12-11 (×4): qty 2

## 2018-12-11 MED ORDER — MORPHINE SULFATE (PF) 2 MG/ML IV SOLN
2.0000 mg | Freq: Once | INTRAVENOUS | Status: AC
  Administered 2018-12-11: 2 mg via INTRAVENOUS
  Filled 2018-12-11: qty 1

## 2018-12-11 MED ORDER — VANCOMYCIN HCL 10 G IV SOLR
1750.0000 mg | INTRAVENOUS | Status: DC
  Administered 2018-12-12: 1750 mg via INTRAVENOUS
  Filled 2018-12-11 (×2): qty 1750

## 2018-12-11 MED ORDER — SODIUM CHLORIDE 0.9% FLUSH
3.0000 mL | Freq: Once | INTRAVENOUS | Status: AC
  Administered 2018-12-11: 3 mL via INTRAVENOUS

## 2018-12-11 MED ORDER — INSULIN ASPART 100 UNIT/ML ~~LOC~~ SOLN
0.0000 [IU] | Freq: Every day | SUBCUTANEOUS | Status: DC
  Administered 2018-12-11: 4 [IU] via SUBCUTANEOUS
  Administered 2018-12-13: 5 [IU] via SUBCUTANEOUS
  Administered 2018-12-14: 2 [IU] via SUBCUTANEOUS
  Filled 2018-12-11 (×3): qty 1

## 2018-12-11 MED ORDER — VANCOMYCIN HCL IN DEXTROSE 1-5 GM/200ML-% IV SOLN
1000.0000 mg | Freq: Once | INTRAVENOUS | Status: DC
  Filled 2018-12-11: qty 200

## 2018-12-11 MED ORDER — ONDANSETRON HCL 4 MG/2ML IJ SOLN
4.0000 mg | Freq: Four times a day (QID) | INTRAMUSCULAR | Status: DC | PRN
  Administered 2018-12-12: 4 mg via INTRAVENOUS
  Filled 2018-12-11: qty 2

## 2018-12-11 MED ORDER — INSULIN ASPART 100 UNIT/ML ~~LOC~~ SOLN
0.0000 [IU] | Freq: Three times a day (TID) | SUBCUTANEOUS | Status: DC
  Administered 2018-12-11 – 2018-12-12 (×2): 1 [IU] via SUBCUTANEOUS
  Administered 2018-12-13: 9 [IU] via SUBCUTANEOUS
  Administered 2018-12-13 – 2018-12-14 (×3): 5 [IU] via SUBCUTANEOUS
  Administered 2018-12-14 – 2018-12-15 (×2): 3 [IU] via SUBCUTANEOUS
  Filled 2018-12-11 (×8): qty 1

## 2018-12-11 MED ORDER — SERTRALINE HCL 50 MG PO TABS
100.0000 mg | ORAL_TABLET | Freq: Every day | ORAL | Status: DC
  Administered 2018-12-11 – 2018-12-14 (×4): 100 mg via ORAL
  Filled 2018-12-11 (×4): qty 2

## 2018-12-11 MED ORDER — ASPIRIN EC 81 MG PO TBEC
81.0000 mg | DELAYED_RELEASE_TABLET | Freq: Every day | ORAL | Status: DC
  Administered 2018-12-11 – 2018-12-14 (×4): 81 mg via ORAL
  Filled 2018-12-11 (×4): qty 1

## 2018-12-11 MED ORDER — PIPERACILLIN-TAZOBACTAM 3.375 G IVPB
3.3750 g | Freq: Three times a day (TID) | INTRAVENOUS | Status: DC
  Administered 2018-12-11 – 2018-12-12 (×3): 3.375 g via INTRAVENOUS
  Filled 2018-12-11 (×3): qty 50

## 2018-12-11 MED ORDER — BRIMONIDINE TARTRATE 0.2 % OP SOLN
1.0000 [drp] | Freq: Two times a day (BID) | OPHTHALMIC | Status: DC
  Administered 2018-12-11 – 2018-12-15 (×8): 1 [drp] via OPHTHALMIC
  Filled 2018-12-11: qty 5

## 2018-12-11 MED ORDER — ACETAMINOPHEN 650 MG RE SUPP: 650.0000 mg | Freq: Four times a day (QID) | RECTAL | Status: DC | PRN

## 2018-12-11 NOTE — Progress Notes (Signed)
Patient c/o of 8/10 pain in right foot and great toe. Notified Dr Verdell Carmine. Ordered Toradol 30mg  IV q6PRN

## 2018-12-11 NOTE — ED Triage Notes (Signed)
First RN Note: pt presents to ED via POV with c/o R great toe infection states called and spoke to his PCP and per his PCP pt was "not supposed to sit in ED waiting room", registration unable to find record of direct admission, and pt unable to get in touch with PCP.

## 2018-12-11 NOTE — Progress Notes (Signed)
PHARMACY -  BRIEF ANTIBIOTIC NOTE   Pharmacy has received consult(s) for Vancomycin from an ED provider.  The patient's profile has been reviewed for ht/wt/allergies/indication/available labs.    One time order(s) placed for Vancomycin 2g IV   Further antibiotics/pharmacy consults should be ordered by admitting physician if indicated.                       Thank you, Pearla Dubonnet 12/11/2018  1:51 PM

## 2018-12-11 NOTE — ED Provider Notes (Signed)
Emergency Department Provider Note   ____________________________________________    I have reviewed the triage vital signs and the nursing notes.   HISTORY  Chief Complaint Wound Infection     HPI Troy Morse is a 58 y.o. male who presents with diabetic wound infection, right great toe.  Patient reports he has had right great toe ulceration on the plantar surface for nearly a month, has been managed by Dr. Vickki Muff podiatry, patient has been on antibiotics over the last 3 days but has developed streaking and worsening redness of his foot.  He was told by his podiatrist to come to the emergency department for IV antibiotics and admission.  He denies fevers or chills.  No abdominal pain.  Does report that several days ago he had myalgias and fatigue but no shortness of breath however the symptoms have resolved  Past Medical History:  Diagnosis Date  . Diabetes mellitus without complication ([place])   . Hypertension     Patient Active Problem List   Diagnosis Date Noted  . Age-related nuclear cataract of both eyes 07/18/2017  . Atrophic gastritis without hemorrhage 11/23/2015  . Acute pancreatitis 10/03/2015  . CKD (chronic kidney disease) stage 3, GFR 30-59 ml/min (HCC) 05/30/2012  . Abnormal stress echocardiogram 05/07/2012  . CAD (coronary artery disease) 04/26/2012  . Cataract 08/23/2011  . Adjustment reaction 03/03/2011    Past Surgical History:  Procedure Laterality Date  . BACK SURGERY    . CHOLECYSTECTOMY    . HERNIA REPAIR      Prior to Admission medications   Medication Sig Start Date End Date Taking? Authorizing Provider  aspirin EC 81 MG tablet Take 81 mg by mouth daily.    [provider]  gabapentin (NEURONTIN) 300 MG capsule Take 300 mg by mouth every evening. 04/26/18   [provider]  latanoprost (XALATAN) 0.005 % ophthalmic solution Place 1 drop into both eyes at bedtime. 04/26/18   [provider]  lisinopril (PRINIVIL,ZESTRIL) 20 MG tablet Take 10 mg by mouth daily. 08/27/17   [provider]  Melatonin 5 MG CHEW Chew 5 mg by mouth at bedtime as needed for sleep.    [provider]  metFORMIN (GLUCOPHAGE) 500 MG tablet Take 1,000 mg by mouth daily with breakfast. 06/06/17   [provider]  metoprolol succinate (TOPROL-XL) 25 MG 24 hr tablet Take 25 mg by mouth daily. 08/24/15   [provider]  NONFORMULARY OR COMPOUNDED ITEM Trimix (30/1/10)-(Pap/Phent/PGE)  Dosage: Inject 70ml per injection  Prefilled Syringe # Test Dose 67ml vial Vial 38ml   Qty 90ml Refills [place] [phone] Fax [phone] 10/01/18   Abbie Sons, MD  NOVOLOG FLEXPEN 100 UNIT/ML FlexPen Inject 0-14 Units into the skin See admin instructions. Inject under the skin 3 times daily at mealtimes according to sliding scale:  100-150: 0u 151-200: 5u 201-250: 8u 251-300: 10u 301-350: 12u >351: 14u 04/26/18   [provider]  pravastatin (PRAVACHOL) 80 MG tablet Take 80 mg by mouth daily. 12/04/17   [provider]  sertraline (ZOLOFT) 100 MG tablet Take 100 mg by mouth daily. 11/06/17   [provider]  TRESIBA FLEXTOUCH 200 UNIT/ML SOPN Inject 80 Units into the skin every evening. 05/06/18   [provider]     Allergies Empagliflozin and Liraglutide  No family history on file.  Social History Social History   Tobacco Use  . Smoking status: Never Smoker  .  Smokeless tobacco: Never Used  Substance Use Topics  . Alcohol use: Not Currently  . Drug use: Never    Review of Systems  Constitutional: No fever/chills Eyes: No visual changes.  ENT: No sore throat. Cardiovascular: Denies chest pain. Respiratory: Denies shortness of breath. Gastrointestinal: No abdominal pain.  No nausea, no vomiting.   Genitourinary: Negative for dysuria. Musculoskeletal: As above Skin: As above. Neurological:  Negative for headaches or weakness   ____________________________________________   PHYSICAL EXAM:  VITAL SIGNS: ED Triage Vitals  Enc Vitals Group     BP 12/11/18 1318 124/64     Pulse Rate 12/11/18 1318 90     Resp 12/11/18 1318 16     Temp 12/11/18 1318 98.2 F (36.8 C)     Temp Source 12/11/18 1318 Oral     SpO2 12/11/18 1318 98 %     Weight 12/11/18 1320 95.3 kg (210 lb)     Height 12/11/18 1320 1.778 m (5\' 10" )     Head Circumference --      Peak Flow --      Pain Score 12/11/18 1319 7     Pain Loc --      Pain Edu? --      Excl. in GC? --     Constitutional: Alert and oriented. No acute distress. Pleasant and interactive  Mouth/Throat: Mucous membranes are moist.   Neck:  Painless ROM Cardiovascular: Normal rate, regular rhythm. Peri [place].  Good peripheral circulation. Respiratory: Normal respiratory effort.  No retractions.   Genitourinary: deferred Musculoskeletal: No lower extremity tenderness nor edema.  Warm and well perfused.  1 x 1 cm ulcer plantar surface right great toe, toe is erythematous with streaking up the dorsal surface of the right foot.  Ulcer does not appear to extend to bone Neurologic:  Normal speech and language. No gross focal neurologic deficits are appreciated.  Skin:  Skin is warm, dry and intact. No rash noted. Psychiatric: Mood and affect are normal. Speech and behavior are normal.  ____________________________________________   LABS (all labs ordered are listed, but only abnormal results are displayed)  Labs Reviewed  SARS CORONAVIRUS 2 (HOSPITAL ORDER, PERFORMED IN [facility])  LACTIC ACID, PLASMA  LACTIC ACID, PLASMA  COMPREHENSIVE METABOLIC PANEL  CBC WITH DIFFERENTIAL/PLATELET  URINALYSIS, COMPLETE (UACMP) WITH MICROSCOPIC   ____________________________________________  EKG  None ____________________________________________  RADIOLOGY  Toe xray ____________________________________________   PROCEDURES   Procedure(s) performed: No  Procedures   Critical Care performed: No ____________________________________________   INITIAL IMPRESSION / ASSESSMENT AND PLAN / ED COURSE  Pertinent labs & imaging results that were available during my care of the patient were reviewed by me and considered in my medical decision making (see chart for details).  Patient with evidence of diabetic foot ulcer, now infected which is failed outpatient treatment with antibiotics.  We will start him on vancomycin and Zosyn and admit to the hospitalist service  Rapid covid swab sent despite asymptomatic at this time given his description of myalgias and fatigue several days ago.     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   Jene EveryKinner, Arjun Hard, MD 12/11/18 [phone]

## 2018-12-11 NOTE — ED Notes (Signed)
ED TO INPATIENT HANDOFF REPORT  ED Nurse Name and Phone #: Victorino DikeJennifer [phone]  S Name/Age/Gender Troy Morse 58 y.o. male Room/Bed: ED33A/ED33A  Code Status   Code Status: Not on file  Home/SNF/Other Home Patient oriented to: self, place, time and situation Is this baseline? Yes   Triage Complete: Triage complete  Chief Complaint sent by dr. right leg, big toe infection  Triage Note First RN Note: pt presents to ED via POV with c/o R great toe infection states called and spoke to his PCP and per his PCP pt was "not supposed to sit in ED waiting room", registration unable to find record of direct admission, and pt unable to get in touch with PCP.   Pt in via POV, advised to be admitted due [facility].  Pt reports wound to bottom of great toe, being followed by podiatry x one month.  Currently on PO antibiotic therapy with worsening symptoms, pt reports discoloration to great toe and red streaking up the foot.  Vitals WDL, NAD noted at this time.   Allergies Allergies  Allergen Reactions  . Empagliflozin Other (See Comments)    Pancreatitis   . Liraglutide Other (See Comments)    Pancreatitis     Level of Care/Admitting Diagnosis ED Disposition    ED Disposition Condition Comment   Admit  [facility] [[id number]]  Level of Care: Med-Surg [16]  Covid Evaluation: Confirmed COVID Negative  Diagnosis: Acute osteomyelitis of toe of right foot [facility](HCC) [[id number]][[id number]]  Admitting Physician: Houston SirenSAINANI, VIVEK J [[id number]]  Attending Physician: Houston SirenSAINANI, VIVEK J [[id number]]  Estimated length of stay: past midnight tomorrow  Certification:: I certify this patient will need inpatient services for at least 2 midnights  PT Class (Do Not Modify): Inpatient [101]  PT Acc Code (Do Not Modify): Private [1]       B Medical/Surgery History Past Medical History:  Diagnosis Date  . Diabetes mellitus without complication (HCC)   .  Hypertension    Past Surgical History:  Procedure Laterality Date  . BACK SURGERY    . CHOLECYSTECTOMY    . HERNIA REPAIR       A IV Location/Drains/Wounds Patient Lines/Drains/Airways Status   Active Line/Drains/Airways    Name:   Placement date:   Placement time:   Site:   Days:   Peripheral IV 12/11/18 Right Forearm   12/11/18    1416    Forearm   less than 1          Intake/Output Last 24 hours  Intake/Output Summary (Last 24 hours) at 12/11/2018 1618 Last data filed at 12/11/2018 1416 Gross per 24 hour  Intake 3 ml  Output -  Net 3 ml    Labs/Imaging Results for orders placed or performed during the hospital encounter of 12/11/18 (from the past 48 hour(s))  Lactic acid, plasma     Status: None   Collection Time: 12/11/18  2:02 PM  Result Value Ref Range   Lactic Acid, Venous 1.4 0.5 - 1.9 mmol/L    Comment: Performed at [facility], [address] [place], [place] [address]  Comprehensive metabolic panel     Status: Abnormal   Collection Time: 12/11/18  2:02 PM  Result Value Ref Range   Sodium 137 135 - 145 mmol/L   Potassium 4.6 3.5 - 5.1 mmol/L   Chloride 104 98 - 111 mmol/L   CO2 22 22 - 32 mmol/L   Glucose, Bld 148 (  H) 70 - 99 mg/dL   BUN 30 (H) 6 - 20 mg/dL   Creatinine, Ser 9.601.47 (H) 0.61 - 1.24 mg/dL   Calcium 9.1 8.9 - 45.410.3 mg/dL   Total Protein 7.8 6.5 - 8.1 g/dL   Albumin 3.7 3.5 - 5.0 g/dL   AST 20 15 - 41 U/L   ALT 18 0 - 44 U/L   Alkaline Phosphatase 69 38 - 126 U/L   Total Bilirubin 0.6 0.3 - 1.2 mg/dL   GFR calc non Af Amer 52 (L) >60 mL/min   GFR calc Af Amer >60 >60 mL/min   Anion gap 11 5 - 15    Comment: Performed at [facility], [address] [place], [place] [address]  CBC with Differential     Status: Abnormal   Collection Time: 12/11/18  2:02 PM  Result Value Ref Range   WBC 9.6 4.0 - 10.5 K/uL   RBC 4.18 (L) 4.22 - 5.81 MIL/uL   Hemoglobin 13.0 13.0 - 17.0 g/dL   HCT 91.439.0 78.239.0 - 95.652.0 %   MCV 93.3 80.0 -  100.0 fL   MCH 31.1 26.0 - 34.0 pg   MCHC 33.3 30.0 - 36.0 g/dL   RDW 21.311.8 08.611.5 - 57.815.5 %   Platelets 193 150 - 400 K/uL   nRBC 0.0 0.0 - 0.2 %   Neutrophils Relative % 77 %   Neutro Abs 7.3 1.7 - 7.7 K/uL   Lymphocytes Relative 12 %   Lymphs Abs 1.2 0.7 - 4.0 K/uL   Monocytes Relative 10 %   Monocytes Absolute 0.9 0.1 - 1.0 K/uL   Eosinophils Relative 1 %   Eosinophils Absolute 0.1 0.0 - 0.5 K/uL   Basophils Relative 0 %   Basophils Absolute 0.0 0.0 - 0.1 K/uL   Immature Granulocytes 0 %   Abs Immature Granulocytes 0.04 0.00 - 0.07 K/uL    Comment: Performed at [facility], [address] [place], [place] [address]  SARS Coronavirus 2 (CEPHEID- Performed in [facility] hospital lab), Hosp Order     Status: None   Collection Time: 12/11/18  2:02 PM   Specimen: Nasopharyngeal Swab  Result Value Ref Range   SARS Coronavirus 2 NEGATIVE NEGATIVE    Comment: (NOTE) If result is NEGATIVE SARS-CoV-2 target nucleic acids are NOT DETECTED. The SARS-CoV-2 RNA is generally detectable in upper and lower  respiratory specimens during the acute phase of infection. The lowest  concentration of SARS-CoV-2 viral copies this assay can detect is 250  copies / mL. A negative result does not preclude SARS-CoV-2 infection  and should not be used as the sole basis for treatment or other  patient management decisions.  A negative result may occur with  improper specimen collection / handling, submission of specimen other  than nasopharyngeal swab, presence of viral mutation(s) within the  areas targeted by this assay, and inadequate number of viral copies  (<250 copies / mL). A negative result must be combined with clinical  observations, patient history, and epidemiological information. If result is POSITIVE SARS-CoV-2 target nucleic acids are DETECTED. The SARS-CoV-2 RNA is generally detectable in upper and lower  respiratory specimens dur ing the acute phase of infection.  Positive   results are indicative of active infection with SARS-CoV-2.  Clinical  correlation with patient history and other diagnostic information is  necessary to determine patient infection status.  Positive results do  not rule out bacterial infection or co-infection with other viruses. If result  is PRESUMPTIVE POSTIVE SARS-CoV-2 nucleic acids MAY BE PRESENT.   A presumptive positive result was obtained on the submitted specimen  and confirmed on repeat testing.  While 2019 novel coronavirus  (SARS-CoV-2) nucleic acids may be present in the submitted sample  additional confirmatory testing may be necessary for epidemiological  and / or clinical management purposes  to differentiate between  SARS-CoV-2 and other Sarbecovirus currently known to infect humans.  If clinically indicated additional testing with an alternate test  methodology [phone] is advised. The SARS-CoV-2 RNA is generally  detectable in upper and lower respiratory sp ecimens during the acute  phase of infection. The expected result is Negative. Fact Sheet for Patients:  BoilerBrush.com.cyhttps://www.fda.gov/media/136312/download Fact Sheet for Healthcare Providers: https://pope.com/https://www.fda.gov/media/136313/download This test is not yet approved or cleared by the [place] FDA and has been authorized for detection and/or diagnosis of SARS-CoV-2 by FDA under an Emergency Use Authorization (EUA).  This EUA will remain in effect (meaning this test can be used) for the duration of the COVID-19 declaration under Section 564(b)(1) of the Act, 21 U.S.C. section 360bbb-3(b)(1), unless the authorization is terminated or revoked sooner. Performed at [facility], [address] [place], [place] [address]    Dg Toe Great Right  Result Date: 12/11/2018 CLINICAL DATA:  First toe infection EXAM: RIGHT GREAT TOE COMPARISON:  None. FINDINGS: Soft tissue defect is noted consistent with the patient's given clinical history. No erosive changes are  identified to suggest osteomyelitis. Some calcification adjacent to the first MTP joint is noted which may be related to underlying gout. Vascular calcifications are seen. IMPRESSION: Changes consistent with the given clinical history although no changes to suggest osteomyelitis are seen. Changes suggestive of gout at the first MTP joint. Electronically Signed   By: Alcide CleverMark  Lukens M.D.   On: 12/11/2018 14:36    Pending Labs Unresulted Labs (From admission, onward)    Start     Ordered   12/11/18 1559  Hemoglobin A1c  Once,   STAT    Comments: To assess prior glycemic control    12/11/18 1558   12/11/18 1323  Lactic acid, plasma  Now then every 2 hours,   STAT     12/11/18 1323   12/11/18 1323  Urinalysis, Complete w Microscopic  ONCE - STAT,   STAT     12/11/18 1323   Signed and Held  HIV antibody (Routine Testing)  Once,   R     Signed and Held   Signed and Held  Basic metabolic panel  Tomorrow morning,   R     Signed and Held   Signed and Held  CBC  Tomorrow morning,   R     Signed and Held   Signed and Held  CBC  (enoxaparin (LOVENOX)    CrCl >/= 30 ml/min)  Once,   R    Comments: Baseline for enoxaparin therapy IF NOT ALREADY DRAWN.  Notify MD if PLT < 100 K.    Signed and Held   Signed and Held  Creatinine, serum  (enoxaparin (LOVENOX)    CrCl >/= 30 ml/min)  Once,   R    Comments: Baseline for enoxaparin therapy IF NOT ALREADY DRAWN.    Signed and Held   Signed and Held  Creatinine, serum  (enoxaparin (LOVENOX)    CrCl >/= 30 ml/min)  Weekly,   R    Comments: while on enoxaparin therapy    Signed and Held  Vitals/Pain Today's Vitals   12/11/18 1318 12/11/18 1319 12/11/18 1320 12/11/18 1338  BP: 124/64   139/77  Pulse: 90   77  Resp: 16     Temp: 98.2 F (36.8 C)     TempSrc: Oral     SpO2: 98%   97%  Weight:   95.3 kg   Height:   5\' 10"  (1.778 m)   PainSc:  7   7     Isolation Precautions Airborne and Contact precautions  Medications Medications   vancomycin (VANCOCIN) 2,000 mg in sodium chloride 0.9 % 500 mL IVPB (has no administration in time range)  insulin aspart (novoLOG) injection 0-5 Units (has no administration in time range)  insulin aspart (novoLOG) injection 0-9 Units (has no administration in time range)  sodium chloride flush (NS) 0.9 % injection 3 mL (3 mLs Intravenous Given 12/11/18 1416)  piperacillin-tazobactam (ZOSYN) IVPB 3.375 g (0 g Intravenous Stopped 12/11/18 1603)    Mobility walks Low fall risk   Focused Assessments Pulmonary Assessment Handoff:  Lung sounds:   O2 Device: Room Air        R Recommendations: See Admitting Provider Note  Report given to:   Additional Notes:

## 2018-12-11 NOTE — ED Triage Notes (Signed)
Pt in via POV, advised to be admitted due to wound infection per [facility] office.  Pt reports wound to bottom of great toe, being followed by podiatry x one month.  Currently on PO antibiotic therapy with worsening symptoms, pt reports discoloration to great toe and red streaking up the foot.  Vitals WDL, NAD noted at this time.

## 2018-12-11 NOTE — H&P (Signed)
[employer] [facility] [place] at [facility]    PATIENT NAME: Troy EatonKenneth Morse    MR#:  [id number]  DATE OF BIRTH:  11/01/1960  DATE OF ADMISSION:  12/11/2018  PRIMARY CARE PHYSICIAN: Verdis PrimeWarcup, Thomas K, MD   REQUESTING/REFERRING PHYSICIAN: Dr. Jene Everyobert Kinner  CHIEF COMPLAINT:   Chief Complaint  Patient presents with  . Wound Infection    HISTORY OF PRESENT ILLNESS:  Troy Morse  is a 58 y.o. male with a known history of diabetes, hypertension who presents to the hospital due to a right great toe redness swelling and pain.  Patient says he noted a corn on the right great toe which is being followed by podiatry Dr. Ether GriffinsFowler and he had been shaving it off over the past week or so and last week he was placed on oral antibiotics as it was somewhat red and swollen, but despite being on oral antibiotics patient noted over the past 2 days that the redness that started streaking up his foot and he was having more pain and swelling in the right great toe.  He therefore called his podiatrist who then told him to come to the ER for further evaluation.  Patient underwent x-ray of his right foot/toe which showed possible osteomyelitis.  Patient is afebrile and hemodynamically stable otherwise and has no other associated symptoms.  Hospitalist services were contacted for admission.  Patient's COVID-19 test is negative.  PAST MEDICAL HISTORY:   Past Medical History:  Diagnosis Date  . Diabetes mellitus without complication (HCC)   . Hypertension     PAST SURGICAL HISTORY:   Past Surgical History:  Procedure Laterality Date  . BACK SURGERY    . CHOLECYSTECTOMY    . HERNIA REPAIR      SOCIAL HISTORY:   Social History   Tobacco Use  . Smoking status: Never Smoker  . Smokeless tobacco: Never Used  Substance Use Topics  . Alcohol use: Not Currently    FAMILY HISTORY:   Family History  Problem Relation Age of Onset  . COPD Mother   . Heart disease Father   . COPD Father      DRUG ALLERGIES:   Allergies  Allergen Reactions  . Empagliflozin Other (See Comments)    Pancreatitis   . Liraglutide Other (See Comments)    Pancreatitis     REVIEW OF SYSTEMS:   Review of Systems  Constitutional: Negative for fever and weight loss.  HENT: Negative for congestion, nosebleeds and tinnitus.   Eyes: Negative for blurred vision, double vision and redness.  Respiratory: Negative for cough, hemoptysis and shortness of breath.   Cardiovascular: Negative for chest pain, orthopnea, leg swelling and PND.  Gastrointestinal: Negative for abdominal pain, diarrhea, melena, nausea and vomiting.  Genitourinary: Negative for dysuria, hematuria and urgency.  Musculoskeletal: Positive for joint pain (Right foot/Great Toe. ). Negative for falls.  Neurological: Negative for dizziness, tingling, sensory change, focal weakness, seizures, weakness and headaches.  Endo/Heme/Allergies: Negative for polydipsia. Does not bruise/bleed easily.  Psychiatric/Behavioral: Negative for depression and memory loss. The patient is not nervous/anxious.     MEDICATIONS AT HOME:   Prior to Admission medications   Medication Sig Start Date End Date Taking? Authorizing Provider  aspirin EC 81 MG tablet Take 81 mg by mouth at bedtime.    Yes [provider]  brimonidine (ALPHAGAN) 0.2 % ophthalmic solution Place 1 drop into both eyes 2 (two) times a day.   Yes [provider]  gabapentin (NEURONTIN) 300 MG capsule  Take 300 mg by mouth every evening. 04/26/18  Yes [provider]  latanoprost (XALATAN) 0.005 % ophthalmic solution Place 1 drop into both eyes at bedtime. 04/26/18  Yes [provider]  lisinopril (PRINIVIL,ZESTRIL) 20 MG tablet Take 10 mg by mouth daily. 08/27/17  Yes [provider]  metFORMIN (GLUCOPHAGE) 500 MG tablet Take 1,000 mg by mouth at bedtime.    Yes [provider]  NONFORMULARY OR COMPOUNDED ITEM Trimix  (30/1/10)-(Pap/Phent/PGE)  Dosage: Inject 1ml per injection  Prefilled Syringe # Test Dose 3ml vial Vial 5ml   Qty 5ml Refills 3  [facility] [phone] Fax [phone] 10/01/18  Yes Stoioff, Verna CzechScott C, MD  NOVOLOG FLEXPEN 100 UNIT/ML FlexPen Inject 0-14 Units into the skin See admin instructions. Inject under the skin 3 times daily at mealtimes according to sliding scale:  100-150: 0u 151-200: 5u 201-250: 8u 251-300: 10u 301-350: 12u >351: 14u 04/26/18  Yes [provider]  pravastatin (PRAVACHOL) 80 MG tablet Take 80 mg by mouth at bedtime.  12/04/17  Yes [provider]  sertraline (ZOLOFT) 100 MG tablet Take 100 mg by mouth at bedtime.  11/06/17  Yes [provider]  TRESIBA FLEXTOUCH 200 UNIT/ML SOPN Inject 88-98 Units into the skin every evening.    Yes [provider]      VITAL SIGNS:  Blood pressure 139/77, pulse 77, temperature 98.2 F (36.8 C), temperature source Oral, resp. rate 16, height 5\' 10"  (1.778 m), weight 95.3 kg, SpO2 97 %.  PHYSICAL EXAMINATION:  Physical Exam  GENERAL:  58 y.o.-year-old patient lying in the bed inno acute distress.  EYES: Pupils equal, round, reactive to light and accommodation. No scleral icterus. Extraocular muscles intact.  HEENT: Head atraumatic, normocephalic. Oropharynx and nasopharynx clear. No oropharyngeal erythema, moist oral mucosa  NECK:  Supple, no jugular venous distention. No thyroid enlargement, no tenderness.  LUNGS: Normal breath sounds bilaterally, no wheezing, rales, rhonchi. No use of accessory muscles of respiration.  CARDIOVASCULAR: S1, S2 RRR. No murmurs, rubs, gallops, clicks.  ABDOMEN: Soft, nontender, nondistended. Bowel sounds present. No organomegaly or mass.  EXTREMITIES: No pedal edema, cyanosis, or clubbing. + 2 pedal & radial pulses b/l.   NEUROLOGIC: Cranial nerves II through XII are intact. No focal Motor or sensory deficits appreciated b/l PSYCHIATRIC: The  patient is alert and oriented x 3. Good affect.  SKIN: No obvious rash, lesion, or ulcer.  Right great toe diabetic foot ulcer as shown below      LABORATORY PANEL:   CBC Recent Labs  Lab 12/11/18 1402  WBC 9.6  HGB 13.0  HCT 39.0  PLT 193   ------------------------------------------------------------------------------------------------------------------  Chemistries  Recent Labs  Lab 12/11/18 1402  NA 137  K 4.6  CL 104  CO2 22  GLUCOSE 148*  BUN 30*  CREATININE 1.47*  CALCIUM 9.1  AST 20  ALT 18  ALKPHOS 69  BILITOT 0.6   ------------------------------------------------------------------------------------------------------------------  Cardiac Enzymes No results for input(s): TROPONINI in the last 168 hours. ------------------------------------------------------------------------------------------------------------------  RADIOLOGY:  Dg Toe Great Right  Result Date: 12/11/2018 CLINICAL DATA:  First toe infection EXAM: RIGHT GREAT TOE COMPARISON:  None. FINDINGS: Soft tissue defect is noted consistent with the patient's given clinical history. No erosive changes are identified to suggest osteomyelitis. Some calcification adjacent to the first MTP joint is noted which may be related to underlying gout. Vascular calcifications are seen. IMPRESSION: Changes consistent with the given clinical history although no changes to suggest osteomyelitis are seen. Changes  suggestive of gout at the first MTP joint. Electronically Signed   By: Inez Catalina M.D.   On: 12/11/2018 14:36     IMPRESSION AND PLAN:   58 year old male with past medical history of diabetes, hypertension who presented to the hospital due to right great toe ulcer which has not improved with outpatient oral antibiotics.  1.  Right great toe ulcer/osteomyelitis- patient presents to the hospital due to a right great toe ulcer which has not improved despite being on oral antibiotics for the past few days.   Patient's x-ray shows possible evidence of osteomyelitis. - Empirically I will place the patient on vancomycin, Zosyn.  I will get a MRI of the foot. -Follow cultures.  Will consult podiatry and sent message to Dr. Cleda Mccreedy.  Is followed by Dr. Vickki Muff as an outpatient  2.  Essential hypertension-continue lisinopril.  3.  Diabetic neuropathy-continue gabapentin.  4.  Diabetes type 2 with neuropathy-continue metformin, sliding scale insulin.  5.  Hyperlipidemia-continue Pravachol.  6.  Depression-continue Zoloft.  7.  Glaucoma-continue latanoprost eyedrops along with Alphagan eyedrops.    All the records are reviewed and case discussed with ED provider. Management plans discussed with the patient, family and they are in agreement.  CODE STATUS: Full code  TOTAL TIME TAKING CARE OF THIS PATIENT: 40 minutes.    Henreitta Leber M.D on 12/11/2018 at 3:59 PM  Between 7am to 6pm - Pager - [phone]  After 6pm go to [email or web address] - password [email or web address] [facility]  [place] [facility]  Office  [phone]  CC: Primary care physician; Germaine Pomfret, MD

## 2018-12-11 NOTE — Consult Note (Signed)
Pharmacy Antibiotic Note  Troy Morse is a 58 y.o. male admitted on 12/11/2018 with osteomyelitis.  Pharmacy has been consulted for Vancomycin and Zosyn dosing.  Plan: 1) Vancomycin 1750 mg IV Q 24 hrs. Goal AUC 400-550. Expected AUC: 496.7 Expected Css: 11.6 mcg/ml SCr used: 1.47 T1/2: 13.5 hours  2) Zosyn 3.375g Q8 hours(infused over 4 hours)   Height: 5\' 10"  (177.8 cm) Weight: 210 lb (95.3 kg) IBW/kg (Calculated) : 73  Temp (24hrs), Avg:98.2 F (36.8 C), Min:98.2 F (36.8 C), Max:98.2 F (36.8 C)  Recent Labs  Lab 12/11/18 1402  WBC 9.6  CREATININE 1.47*  LATICACIDVEN 1.4    Estimated Creatinine Clearance: 63.5 mL/min (A) (by C-G formula based on SCr of 1.47 mg/dL (H)).    Allergies  Allergen Reactions  . Empagliflozin Other (See Comments)    Pancreatitis   . Liraglutide Other (See Comments)    Pancreatitis     Antimicrobials this admission: VAncomycin 7/1 >>  Zosyn 7/1 >>    Thank you for allowing pharmacy to be a part of this patient's care.  Lance Coon A Ausencio Vaden 12/11/2018 4:26 PM

## 2018-12-12 DIAGNOSIS — Z888 Allergy status to other drugs, medicaments and biological substances status: Secondary | ICD-10-CM

## 2018-12-12 DIAGNOSIS — L97519 Non-pressure chronic ulcer of other part of right foot with unspecified severity: Secondary | ICD-10-CM

## 2018-12-12 DIAGNOSIS — Z79899 Other long term (current) drug therapy: Secondary | ICD-10-CM

## 2018-12-12 DIAGNOSIS — I10 Essential (primary) hypertension: Secondary | ICD-10-CM

## 2018-12-12 DIAGNOSIS — E11319 Type 2 diabetes mellitus with unspecified diabetic retinopathy without macular edema: Secondary | ICD-10-CM

## 2018-12-12 DIAGNOSIS — Z794 Long term (current) use of insulin: Secondary | ICD-10-CM

## 2018-12-12 DIAGNOSIS — L089 Local infection of the skin and subcutaneous tissue, unspecified: Secondary | ICD-10-CM

## 2018-12-12 DIAGNOSIS — E11621 Type 2 diabetes mellitus with foot ulcer: Secondary | ICD-10-CM

## 2018-12-12 DIAGNOSIS — E11628 Type 2 diabetes mellitus with other skin complications: Principal | ICD-10-CM

## 2018-12-12 DIAGNOSIS — E785 Hyperlipidemia, unspecified: Secondary | ICD-10-CM

## 2018-12-12 DIAGNOSIS — E1140 Type 2 diabetes mellitus with diabetic neuropathy, unspecified: Secondary | ICD-10-CM

## 2018-12-12 LAB — GLUCOSE, CAPILLARY
Glucose-Capillary: 106 mg/dL — ABNORMAL HIGH (ref 70–99)
Glucose-Capillary: 138 mg/dL — ABNORMAL HIGH (ref 70–99)
Glucose-Capillary: 184 mg/dL — ABNORMAL HIGH (ref 70–99)
Glucose-Capillary: 191 mg/dL — ABNORMAL HIGH (ref 70–99)

## 2018-12-12 LAB — CBC
HCT: 36.7 % — ABNORMAL LOW (ref 39.0–52.0)
Hemoglobin: 12.3 g/dL — ABNORMAL LOW (ref 13.0–17.0)
MCH: 31.6 pg (ref 26.0–34.0)
MCHC: 33.5 g/dL (ref 30.0–36.0)
MCV: 94.3 fL (ref 80.0–100.0)
Platelets: 175 10*3/uL (ref 150–400)
RBC: 3.89 MIL/uL — ABNORMAL LOW (ref 4.22–5.81)
RDW: 11.7 % (ref 11.5–15.5)
WBC: 7.7 10*3/uL (ref 4.0–10.5)
nRBC: 0 % (ref 0.0–0.2)

## 2018-12-12 LAB — BASIC METABOLIC PANEL
Anion gap: 8 (ref 5–15)
BUN: 35 mg/dL — ABNORMAL HIGH (ref 6–20)
CO2: 25 mmol/L (ref 22–32)
Calcium: 9.2 mg/dL (ref 8.9–10.3)
Chloride: 107 mmol/L (ref 98–111)
Creatinine, Ser: 1.62 mg/dL — ABNORMAL HIGH (ref 0.61–1.24)
GFR calc Af Amer: 53 mL/min — ABNORMAL LOW (ref >60)
GFR calc non Af Amer: 46 mL/min — ABNORMAL LOW (ref >60)
Glucose, Bld: 165 mg/dL — ABNORMAL HIGH (ref 70–99)
Potassium: 4.3 mmol/L (ref 3.5–5.1)
Sodium: 140 mmol/L (ref 135–145)

## 2018-12-12 MED ORDER — VANCOMYCIN HCL IN DEXTROSE 1-5 GM/200ML-% IV SOLN
1000.0000 mg | INTRAVENOUS | Status: DC
  Administered 2018-12-13: 1000 mg via INTRAVENOUS
  Filled 2018-12-12: qty 200

## 2018-12-12 MED ORDER — SODIUM CHLORIDE 0.9 % IV SOLN
1.0000 g | Freq: Two times a day (BID) | INTRAVENOUS | Status: DC
  Administered 2018-12-12 – 2018-12-13 (×2): 1 g via INTRAVENOUS
  Filled 2018-12-12 (×3): qty 1

## 2018-12-12 MED ORDER — SODIUM CHLORIDE 0.9 % IV SOLN: 2.0000 g | Freq: Two times a day (BID) | INTRAVENOUS | Status: DC

## 2018-12-12 MED ORDER — MORPHINE SULFATE (PF) 2 MG/ML IV SOLN
2.0000 mg | INTRAVENOUS | Status: DC | PRN
  Administered 2018-12-12 – 2018-12-14 (×6): 2 mg via INTRAVENOUS
  Filled 2018-12-12 (×6): qty 1

## 2018-12-12 MED ORDER — LIVING WELL WITH DIABETES BOOK
Freq: Once | Status: AC
  Administered 2018-12-12: 14:00:00
  Filled 2018-12-12: qty 1

## 2018-12-12 MED ORDER — OXYCODONE HCL 5 MG PO TABS
5.0000 mg | ORAL_TABLET | ORAL | Status: DC | PRN
  Administered 2018-12-12 – 2018-12-13 (×5): 10 mg via ORAL
  Administered 2018-12-14: 5 mg via ORAL
  Administered 2018-12-14 (×3): 10 mg via ORAL
  Filled 2018-12-12 (×9): qty 2

## 2018-12-12 MED ORDER — ENSURE PRE-SURGERY PO LIQD
296.0000 mL | Freq: Once | ORAL | Status: DC
  Filled 2018-12-12: qty 296

## 2018-12-12 MED ORDER — CHLORHEXIDINE GLUCONATE 4 % EX LIQD
60.0000 mL | Freq: Once | CUTANEOUS | Status: AC
  Administered 2018-12-13: 4 via TOPICAL

## 2018-12-12 MED ORDER — POVIDONE-IODINE 10 % EX SWAB: 2.0000 "application " | Freq: Once | CUTANEOUS | Status: DC

## 2018-12-12 NOTE — TOC Initial Note (Signed)
Transition of Care [facility]) - Initial/Assessment Note    Patient Details  Name: Troy Morse MRN: [id number] Date of Birth: 03-12-1961  [facility] CM/SW Contact:    Beverly Sessions, RN Phone Number: 12/12/2018, 2:47 PM  Clinical Narrative:                 Patient admitted with right great toe osteomyelitis.   RNCM met with patient at bedside, and spoke with wife on speaker phone.    Patient lives at home with wife. Denies any transportation needs  Pharmacy [facility] in [place].  No issues obtaining medications  Patient is established at [facility].  He PCP Warcup is no longer at the practice, however his wife has scheduled him an appointment at the office  7/8 at 3pm with "tabitha"  Patient is independent at baseline. No DME  Patient states the plan is to go to the OR tomorrow to "open up my toe".  Per patient the plan is to discharge home with IV antibiotics.  Wife and patient state they do not have any preference of home health agency.  Wife states that she is comfortable administering IV and any dressing changes that are indicated.   Carolynn Sayers with [facility] notified of potential referral.  She has confirmed they will be able to assist arranging nursing services at discharge.     Expected Discharge Plan: [place] Barriers to Discharge: Continued Medical Work up   Patient Goals and CMS Choice Patient states their goals for this hospitalization and ongoing recovery are:: "I want to keep my toe"   Choice offered to / list presented to : Patient, Spouse  Expected Discharge Plan and Services Expected Discharge Plan: [place]       Living arrangements for the past 2 months: Single Family Home                                      Prior Living Arrangements/Services Living arrangements for the past 2 months: Single Family Home Lives with:: Spouse   Do you feel safe going back to the  place where you live?: Yes      Need for Family Participation in Patient Care: Yes (Comment) Care giver support system in place?: Yes (comment)   Criminal Activity/Legal Involvement Pertinent to Current Situation/Hospitalization: No - Comment as needed  Activities of Daily Living Home Assistive Devices/Equipment: Other (Comment)(boot) ADL Screening (condition at time of admission) Patient's cognitive ability adequate to safely complete daily activities?: Yes Is the patient deaf or have difficulty hearing?: No Does the patient have difficulty seeing, even when wearing glasses/contacts?: No Does the patient have difficulty concentrating, remembering, or making decisions?: No Patient able to express need for assistance with ADLs?: Yes Does the patient have difficulty dressing or bathing?: No Independently performs ADLs?: Yes (appropriate for developmental age) Does the patient have difficulty walking or climbing stairs?: No Weakness of Legs: Right Weakness of Arms/Hands: None  Permission Sought/Granted                  Emotional Assessment Appearance:: Appears stated age Attitude/Demeanor/Rapport: Gracious Affect (typically observed): Accepting Orientation: : Oriented to Self, Oriented to Place, Oriented to  Time, Oriented to Situation   Psych Involvement: No (comment)  Admission diagnosis:  Wound infection [T14.8XXA, L08.9] Diabetic ulcer of toe of right foot associated with diabetes mellitus  of other type, unspecified ulcer stage ([place]) [id number], L97.519] Patient Active Problem List   Diagnosis Date Noted  . Acute osteomyelitis of toe of right foot ([place]) 12/11/2018  . Age-related nuclear cataract of both eyes 07/18/2017  . Atrophic gastritis without hemorrhage 11/23/2015  . Acute pancreatitis 10/03/2015  . CKD (chronic kidney disease) stage 3, GFR 30-59 ml/min (HCC) 05/30/2012  . Abnormal stress echocardiogram 05/07/2012  . CAD (coronary artery disease) 04/26/2012  .  Cataract 08/23/2011  . Adjustment reaction 03/03/2011   PCP:  Patient, No Pcp Per Pharmacy:   [facility] [place], [place] - [address] AT [address] [facility] [place] [address] Phone: [phone] Fax: [place], [place] - [address] [place] [address] Phone: [phone] Fax: [phone]     Social Determinants of Health (SDOH) Interventions    Readmission Risk Interventions No flowsheet data found.

## 2018-12-12 NOTE — Progress Notes (Signed)
[employer] - [place] at [facility]   PATIENT NAME: Rebecca EatonKenneth Worthley    MR#:  [id number]  DATE OF BIRTH:  12/02/1960  SUBJECTIVE:  CHIEF COMPLAINT:   Chief Complaint  Patient presents with   Wound Infection   -Right foot toe - pain.  Planning to go to the OR tomorrow for possible debridement  REVIEW OF SYSTEMS:  Review of Systems  Constitutional: Negative for chills, fever and malaise/fatigue.  HENT: Negative for congestion, ear discharge, hearing loss and nosebleeds.   Eyes: Negative for blurred vision and double vision.  Respiratory: Negative for cough, shortness of breath and wheezing.   Cardiovascular: Negative for chest pain, palpitations and leg swelling.  Gastrointestinal: Negative for abdominal pain, constipation, diarrhea, nausea and vomiting.  Genitourinary: Negative for dysuria.  Musculoskeletal: Positive for joint pain and myalgias.  Neurological: Negative for dizziness, focal weakness, seizures, weakness and headaches.  Psychiatric/Behavioral: Negative for depression.    DRUG ALLERGIES:   Allergies  Allergen Reactions   Empagliflozin Other (See Comments)    Pancreatitis    Liraglutide Other (See Comments)    Pancreatitis     VITALS:  Blood pressure 132/66, pulse 61, temperature 98.5 F (36.9 C), temperature source Oral, resp. rate 18, height 5\' 10"  (1.778 m), weight 95.3 kg, SpO2 97 %.  PHYSICAL EXAMINATION:  Physical Exam  GENERAL:  58 y.o.-year-old patient lying in the bed with no acute distress.  EYES: Pupils equal, round, reactive to light and accommodation. No scleral icterus. Extraocular muscles intact.  HEENT: Head atraumatic, normocephalic. Oropharynx and nasopharynx clear.  NECK:  Supple, no jugular venous distention. No thyroid enlargement, no tenderness.  LUNGS: Normal breath sounds bilaterally, no wheezing, rales,rhonchi or crepitation. No use of accessory muscles of respiration.  CARDIOVASCULAR: S1, S2 normal. No  murmurs, rubs, or gallops.  ABDOMEN: Soft, nontender, nondistended. Bowel sounds present. No organomegaly or mass.  EXTREMITIES: right foot first toe plantar wound. No pedal edema, cyanosis, or clubbing.  NEUROLOGIC: Cranial nerves II through XII are intact. Muscle strength 5/5 in all extremities. Sensation intact. Gait not checked.  PSYCHIATRIC: The patient is alert and oriented x 3.  SKIN: No obvious rash, lesion, or ulcer.       LABORATORY PANEL:   CBC Recent Labs  Lab 12/12/18 0325  WBC 7.7  HGB 12.3*  HCT 36.7*  PLT 175   ------------------------------------------------------------------------------------------------------------------  Chemistries  Recent Labs  Lab 12/11/18 1402 12/12/18 0325  NA 137 140  K 4.6 4.3  CL 104 107  CO2 22 25  GLUCOSE 148* 165*  BUN 30* 35*  CREATININE 1.47* 1.62*  CALCIUM 9.1 9.2  AST 20  --   ALT 18  --   ALKPHOS 69  --   BILITOT 0.6  --    ------------------------------------------------------------------------------------------------------------------  Cardiac Enzymes No results for input(s): TROPONINI in the last 168 hours. ------------------------------------------------------------------------------------------------------------------  RADIOLOGY:  Mr Foot Right Wo Contrast  Result Date: 12/11/2018 CLINICAL DATA:  Swelling of the foot, diabetes EXAM: MRI OF THE RIGHT FOREFOOT WITHOUT CONTRAST TECHNIQUE: Multiplanar, multisequence MR imaging of the right forefoot was performed. No intravenous contrast was administered. COMPARISON:  Radiographs from 12/11/2018 FINDINGS: Bones/Joint/Cartilage Abnormal edema signal in the proximal and distal phalanges of the great toe, especially the distal phalanx, suspicious for osteomyelitis. Erosion along the medial head of the first metatarsal favoring gout. Erosions along the lateral intermetatarsal articulations of the bases of the second and third metatarsals, with adjacent marrow edema  tracking in the base of the  third metatarsal. Subtle marrow edema in the proximal metaphysis of the fourth metatarsal noted. Osteochondral lesion or erosion along the distal margin of the middle cuneiform medially. Ligaments The Lisfranc ligament appears intact on image 9/9. Muscles and Tendons Mild flexor hallucis longus tenosynovitis. Patchy edema signal in the abductor hallucis, flexor hallucis brevis, and in the interosseous muscles especially between the first and second metatarsals. Soft tissues Mild subcutaneous edema tracks along the dorsum of the foot and into the great toe. IMPRESSION: 1. Abnormal marrow edema in the distal phalanx and to a lesser extent in the proximal phalanx of the great toe, suspicious for osteomyelitis. There is surrounding soft tissue edema in the great toe likely from cellulitis, possibly a small plantar ulcer along the great toe. 2. Intermetatarsal erosions along the bases of the second and third metatarsals. Medial erosion along the first metatarsal head. Appearance favors gout. 3. Non-fragmented osteochondral lesion or erosion along the distal margin of the middle cuneiform. 4. Mild flexor hallucis longus tenosynovitis distally. 5. Patchy edema in some muscles in the forefoot which could be from myositis or neurogenic edema. Electronically Signed   By: Van Clines M.D.   On: 12/11/2018 19:49   Dg Toe Great Right  Result Date: 12/11/2018 CLINICAL DATA:  First toe infection EXAM: RIGHT GREAT TOE COMPARISON:  None. FINDINGS: Soft tissue defect is noted consistent with the patient's given clinical history. No erosive changes are identified to suggest osteomyelitis. Some calcification adjacent to the first MTP joint is noted which may be related to underlying gout. Vascular calcifications are seen. IMPRESSION: Changes consistent with the given clinical history although no changes to suggest osteomyelitis are seen. Changes suggestive of gout at the first MTP joint.  Electronically Signed   By: Inez Catalina M.D.   On: 12/11/2018 14:36    EKG:  No orders found for this or any previous visit.  ASSESSMENT AND PLAN:   58 year old male with past medical history significant for hypertension, diabetes, neuropathy presents to hospital secondary to right foot great toe ulceration without improvement on oral antibiotics.  1.  Right foot great toe osteomyelitis-x-rays have been reviewed, no osteomyelitis noted initially -MRI of the foot showing possible osteomyelitis with soft tissue edema.  Appreciate podiatry consult. -Possibility of going to OR tomorrow with debridement.  No plans for amputation at this time.  Recommended ID consult as patient might need IV antibiotics at discharge. -Currently on vancomycin and Zosyn.  Follow-up Intra-Op cultures  2.  Hypertension-on lisinopril  3.  Neuropathy-on gabapentin  4.  Depression-Zoloft  5.  DVT prophylaxis-on Lovenox     All the records are reviewed and case discussed with Care Management/Social Workerr. Management plans discussed with the patient, family and they are in agreement.  CODE STATUS: Full code  TOTAL TIME TAKING CARE OF THIS PATIENT: 38 minutes.   POSSIBLE D/C IN 2-3 DAYS, DEPENDING ON CLINICAL CONDITION.   Gladstone Lighter M.D on 12/12/2018 at 1:25 PM  Between 7am to 6pm - Pager - [phone]  After 6pm go to [email or web address] - password [email or web address] [place] [facility]  Office  [phone]  CC: Primary care physician; Patient, No Pcp Per

## 2018-12-12 NOTE — Progress Notes (Signed)
Inpatient Diabetes Program Recommendations  AACE/ADA: New Consensus Statement on Inpatient Glycemic Control (2015)  Target Ranges:  Prepandial:   less than 140 mg/dL      Peak postprandial:   less than 180 mg/dL (1-2 hours)      Critically ill patients:  140 - 180 mg/dL   Lab Results  Component Value Date   GLUCAP 138 (H) 12/12/2018   HGBA1C 11.0 (H) 12/11/2018    Review of Glycemic Control Results for Troy Morse, Troy Morse (MRN [id number]) as of 12/12/2018 14:57  Ref. Range 12/11/2018 16:47 12/11/2018 21:24 12/12/2018 07:57 12/12/2018 12:12  Glucose-Capillary Latest Ref Range: 70 - 99 mg/dL 132 (H) 344 (H) 106 (H) 138 (H)   Diabetes history: DM2 Outpatient Diabetes medications: Tresiba 88-98 units daily + Metformin 1 gm bid + Novolog 0-14 units tid ac meals Current orders for Inpatient glycemic control: Metformin 1 gm q hs + Novolog sensitive tid + hs 0-5  Inpatient Diabetes Program Recommendations:   Spoke with patient @ bedside regarding diabetes management. Patient states his A1c in January 2020 was 15 and now here it is 11.0. Patient has changed a lot of his diet and has lost approximately 40 pounds during this time. May need basal added. Tyler Aas may still be providing some coverage. Will follow during hospitalization.  Thank you, Nani Gasser. Aairah Negrette, RN, MSN, CDE  Diabetes Coordinator Inpatient Glycemic Control Team Team Pager [phone] (8am-5pm) 12/12/2018 3:00 PM

## 2018-12-12 NOTE — Consult Note (Signed)
ORTHOPAEDIC CONSULTATION  REQUESTING PHYSICIAN: Gladstone Lighter, MD  Chief Complaint: Right great toe infection  HPI: Troy Morse is a 58 y.o. male who complains of worsening pain from an infection in his right great toe.  Well-known to me in the outpatient clinic.  He developed an ulceration.  Was seen earlier in the week with noted redness and drainage to his right great toe at the interphalangeal joint plantarly.  Over the next 2 days he complained of worsening pain redness and a red streak up his foot with fever.  Called our office and we recommended ER follow-up for likely admission.  IV antibiotics currently.  X-rays and MRI have been performed.  He states his pain is in much better control at this time.  He has noticed purulent drainage from the wound on his right great toe.  Past Medical History:  Diagnosis Date  . Diabetes mellitus without complication ([place])   . Hypertension    Past Surgical History:  Procedure Laterality Date  . BACK SURGERY    . CHOLECYSTECTOMY    . HERNIA REPAIR     Social History   Socioeconomic History  . Marital status: Married    Spouse name: Not on file  . Number of children: Not on file  . Years of education: Not on file  . Highest education level: Not on file  Occupational History  . Not on file  Social Needs  . Financial resource strain: Not on file  . Food insecurity    Worry: Not on file    Inability: Not on file  . Transportation needs    Medical: Not on file    Non-medical: Not on file  Tobacco Use  . Smoking status: Never Smoker  . Smokeless tobacco: Never Used  Substance and Sexual Activity  . Alcohol use: Not Currently  . Drug use: Never  . Sexual activity: Not on file  Lifestyle  . Physical activity    Days per week: Not on file    Minutes per session: Not on file  . Stress: Not on file  Relationships  . Social Herbalist on phone: Not on file    Gets together: Not on file    Attends religious  service: Not on file    Active member of club or organization: Not on file    Attends meetings of clubs or organizations: Not on file    Relationship status: Not on file  Other Topics Concern  . Not on file  Social History Narrative  . Not on file   Family History  Problem Relation Age of Onset  . COPD Mother   . Heart disease Father   . COPD Father    Allergies  Allergen Reactions  . Empagliflozin Other (See Comments)    Pancreatitis   . Liraglutide Other (See Comments)    Pancreatitis    Prior to Admission medications   Medication Sig Start Date End Date Taking? Authorizing Provider  aspirin EC 81 MG tablet Take 81 mg by mouth at bedtime.    Yes [provider]  brimonidine (ALPHAGAN) 0.2 % ophthalmic solution Place 1 drop into both eyes 2 (two) times a day.   Yes [provider]  gabapentin (NEURONTIN) 300 MG capsule Take 300 mg by mouth every evening. 04/26/18  Yes [provider]  latanoprost (XALATAN) 0.005 % ophthalmic solution Place 1 drop into both eyes at bedtime. 04/26/18  Yes [provider]  lisinopril (PRINIVIL,ZESTRIL) 20 MG  tablet Take 10 mg by mouth daily. 08/27/17  Yes [provider]  metFORMIN (GLUCOPHAGE) 500 MG tablet Take 1,000 mg by mouth at bedtime.    Yes [provider]  NONFORMULARY OR COMPOUNDED ITEM Trimix (30/1/10)-(Pap/Phent/PGE)  Dosage: Inject 1ml per injection  Prefilled Syringe # Test Dose 3ml vial Vial 5ml   Qty 5ml Refills 3  [facility] [phone] Fax [phone] 10/01/18  Yes Stoioff, Verna CzechScott C, MD  NOVOLOG FLEXPEN 100 UNIT/ML FlexPen Inject 0-14 Units into the skin See admin instructions. Inject under the skin 3 times daily at mealtimes according to sliding scale:  100-150: 0u 151-200: 5u 201-250: 8u 251-300: 10u 301-350: 12u >351: 14u 04/26/18  Yes [provider]  pravastatin (PRAVACHOL) 80 MG tablet Take 80 mg by mouth at bedtime.  12/04/17  Yes  [provider]  sertraline (ZOLOFT) 100 MG tablet Take 100 mg by mouth at bedtime.  11/06/17  Yes [provider]  TRESIBA FLEXTOUCH 200 UNIT/ML SOPN Inject 88-98 Units into the skin every evening.    Yes [provider]   Mr Foot Right Wo Contrast  Result Date: 12/11/2018 CLINICAL DATA:  Swelling of the foot, diabetes EXAM: MRI OF THE RIGHT FOREFOOT WITHOUT CONTRAST TECHNIQUE: Multiplanar, multisequence MR imaging of the right forefoot was performed. No intravenous contrast was administered. COMPARISON:  Radiographs from 12/11/2018 FINDINGS: Bones/Joint/Cartilage Abnormal edema signal in the proximal and distal phalanges of the great toe, especially the distal phalanx, suspicious for osteomyelitis. Erosion along the medial head of the first metatarsal favoring gout. Erosions along the lateral intermetatarsal articulations of the bases of the second and third metatarsals, with adjacent marrow edema tracking in the base of the third metatarsal. Subtle marrow edema in the proximal metaphysis of the fourth metatarsal noted. Osteochondral lesion or erosion along the distal margin of the middle cuneiform medially. Ligaments The Lisfranc ligament appears intact on image 9/9. Muscles and Tendons Mild flexor hallucis longus tenosynovitis. Patchy edema signal in the abductor hallucis, flexor hallucis brevis, and in the interosseous muscles especially between the first and second metatarsals. Soft tissues Mild subcutaneous edema tracks along the dorsum of the foot and into the great toe. IMPRESSION: 1. Abnormal marrow edema in the distal phalanx and to a lesser extent in the proximal phalanx of the great toe, suspicious for osteomyelitis. There is surrounding soft tissue edema in the great toe likely from cellulitis, possibly a small plantar ulcer along the great toe. 2. Intermetatarsal erosions along the bases of the second and third metatarsals. Medial erosion along the first metatarsal head.  Appearance favors gout. 3. Non-fragmented osteochondral lesion or erosion along the distal margin of the middle cuneiform. 4. Mild flexor hallucis longus tenosynovitis distally. 5. Patchy edema in some muscles in the forefoot which could be from myositis or neurogenic edema. Electronically Signed   By: Gaylyn RongWalter  Liebkemann M.D.   On: 12/11/2018 19:49   Dg Toe Great Right  Result Date: 12/11/2018 CLINICAL DATA:  First toe infection EXAM: RIGHT GREAT TOE COMPARISON:  None. FINDINGS: Soft tissue defect is noted consistent with the patient's given clinical history. No erosive changes are identified to suggest osteomyelitis. Some calcification adjacent to the first MTP joint is noted which may be related to underlying gout. Vascular calcifications are seen. IMPRESSION: Changes consistent with the given clinical history although no changes to suggest osteomyelitis are seen. Changes suggestive of gout at the first MTP joint. Electronically Signed   By: Alcide CleverMark  Lukens M.D.   On: 12/11/2018 14:36  Positive ROS: All other systems have been reviewed and were otherwise negative with the exception of those mentioned in the HPI and as above.  12 point ROS was performed.  Physical Exam: General: Alert and oriented.  No apparent distress.  Vascular:  Left foot:Dorsalis Pedis:  present Posterior Tibial:  present  Right foot: Dorsalis Pedis:  present Posterior Tibial:  present  Neuro:absent protective sensation  Derm: Left foot without ulceration.  Plantar right great toe at the interphalangeal joint has an open ulceration with surrounding erythema to the great toe.  There is noted purulent drainage from the wound at this time.  The erythema into the medial arch has improved from his appointment with me on Monday.  The purulence is new at this time.  He describes a red streak on the top of his right foot that seems to have subsided since yesterday.  Ortho/MS: He has noted focal edema to the right great toe.   Right foot is mildly to moderately edematous as well.  No edema to the left foot.  Assessment: Diabetic foot ulceration with infection Possible osteomyelitis on MRI  Plan: X-rays are negative and there is no obvious erosive changes to the great toe proximal distal phalanx on MRI.  MRI is concerning with edema into the distal phalanx and proximal phalanx of the right great toe with noted cellulitis around the area.  He does have purulent drainage and the wound does probe fairly deep to the bone.  I suspect he has early osteomyelitis at this time.  I offered him 2 options in regards to surgical intervention in order to try to eradicate the infection.  We discussed performing an operative I&D with drainage of the infection and placing the patient on long-term IV antibiotics with the assistance of infectious disease.  I discussed the possible long-term sequela of continued open wound worsening infection and need to undergo amputation of the great toe.  We also discussed that amputation of the great toe could be a curative result with a quicker recovery.  He weighed his options and has elected undergo an I&D of the great toe with IV antibiotics and local wound care.  We will plan surgery tomorrow morning.  Orders will be placed.  Gust this with his wife as well.  He understands her risk benefits alternatives and complications associated with surgery.    Irean HongFowler, Nitesh Pitstick A, DPM Cell [phone]   12/12/2018 1:06 PM

## 2018-12-12 NOTE — Consult Note (Addendum)
Pharmacy Antibiotic Note  Troy Morse is a 58 y.o. male admitted on 12/11/2018 with osteomyelitis.  Pharmacy has been consulted for Vancomycin and Zosyn dosing.  Slight increase in Scr.   Plan: 1) Change Vancomycin 1000 mg IV Q 24 hrs. Will check VR with AM labs.  Goal AUC 400-550. Expected AUC: 446.6 Expected Css: 10.5 mcg/ml SCr used: 1.62   2) Continue Zosyn 3.375g Q8 hours(infused over 4 hours)   Height: 5\' 10"  (177.8 cm) Weight: 210 lb (95.3 kg) IBW/kg (Calculated) : 73  Temp (24hrs), Avg:98.4 F (36.9 C), Min:98.2 F (36.8 C), Max:98.6 F (37 C)  Recent Labs  Lab 12/11/18 1402 12/12/18 0325  WBC 9.6 7.7  CREATININE 1.47* 1.62*  LATICACIDVEN 1.4  --     Estimated Creatinine Clearance: 57.6 mL/min (A) (by C-G formula based on SCr of 1.62 mg/dL (H)).    Allergies  Allergen Reactions  . Empagliflozin Other (See Comments)    Pancreatitis   . Liraglutide Other (See Comments)    Pancreatitis     Antimicrobials this admission: VAncomycin 7/1 >>  Zosyn 7/1 >>    Thank you for allowing pharmacy to be a part of this patient's care.  Rowland Lathe 12/12/2018 8:43 AM

## 2018-12-12 NOTE — Anesthesia Preprocedure Evaluation (Addendum)
Anesthesia Evaluation  Patient identified by MRN, date of birth, ID band Patient awake    Reviewed: Allergy & Precautions, H&P , NPO status , Patient's Chart, lab work & pertinent test results  Airway Mallampati: II  TM Distance: >3 FB Neck ROM: full    Dental  (+) Teeth Intact   Pulmonary neg pulmonary ROS, neg shortness of breath, neg COPD, neg recent URI,           Cardiovascular hypertension, (-) angina+ CAD  (-) Past MI and (-) Cardiac Stents (-) dysrhythmias      Neuro/Psych PSYCHIATRIC DISORDERS negative neurological ROS     GI/Hepatic negative GI ROS, Neg liver ROS,   Endo/Other  diabetes  Renal/GU CRFRenal disease     Musculoskeletal   Abdominal   Peds  Hematology negative hematology ROS (+)   Anesthesia Other Findings Past Medical History: No date: Diabetes mellitus without complication ([facility]) No date: Hypertension  Past Surgical History: No date: BACK SURGERY No date: CHOLECYSTECTOMY No date: HERNIA REPAIR  BMI    Body Mass Index: 30.13 kg/m      Reproductive/Obstetrics negative OB ROS                            Anesthesia Physical Anesthesia Plan  ASA: III  Anesthesia Plan: General LMA   Post-op Pain Management:    Induction:   PONV Risk Score and Plan: Dexamethasone, Ondansetron, Midazolam and Treatment may vary due to age or medical condition  Airway Management Planned:   Additional Equipment:   Intra-op Plan:   Post-operative Plan:   Informed Consent: I have reviewed the patients History and Physical, chart, labs and discussed the procedure including the risks, benefits and alternatives for the proposed anesthesia with the patient or authorized representative who has indicated his/her understanding and acceptance.     Dental Advisory Given  Plan Discussed with: Anesthesiologist and CRNA  Anesthesia Plan Comments:         Anesthesia Quick  Evaluation

## 2018-12-12 NOTE — Consult Note (Signed)
NAME: Troy Morse  DOB: [birth year]  MRN: [id number]  Date/Time: 12/12/2018 3:58 PM  REQUESTING PROVIDER:kalisetti Subjective:  REASON FOR CONSULT: Right great toe infection ? BRANSYN ADAMI is a 58 y.o. male with a history of diabetes mellitus, hyperlipidemia, hypertension, presents with worsening right great toe wound.  He has been followed by Dr. Vickki Muff podiatrist as outpatient. Pt says he has had a callus on the right great toe plantar aspect for many months now.Marland KitchenHe first went to see Dr. Vickki Muff in April 2020 because he had noticed some discoloration and open wound.  Dr. Vickki Muff diagnosed a diabetic ulcer and debrided the surrounding nonviable hyperkeratotic fibrotic tissue.  And applied antibiotic ointment to it.  He had been following him since then.  Patient says last weekend he got into the pool without any dressing and got the wound  infected. On 12/09/2018 he presented to his office with worsening wound.  He had also noted redness and swelling.  To follow noted a small 5 to 6 mm ulceration on the plantar aspect of the interphalangeal joint of the right great toe with overlying hyperkeratosis.  Upon excisional debridement there was noted to be a fair amount of necrotic tissue and a necrotic plug which he debrided all the way down to the tendon.  Foul odor was also noted from that area.  No purulent drainage was noted.  There was minimal erythema.      No lymphangitic streaking was present.  He took cultures and he prescribed him ciprofloxacin and doxycycline.  X-ray of the foot did not show any erosive change or gas.   Yesterday as the foot was worse and he had a temperature of 101 he called Dr. Alvera Singh office and was asked to go to the ED.  Patient had an x-ray done in the ED and that showed possible osteomyelitis.  He did not have any fever or chills.  His vitals in the ED where temperature of 98.2, blood pressure of 139/77 and pulse of 77. WBC was 9.6, hemoglobin of 13 and platelet of  193. Creatinine was 1.47.  Patient was placed on vancomycin and Zosyn and MRI of the foot  showed marrow edema in the distal phalanx and to a lesser extent in the proximal phalanx of the great toe suspicious for osteomyelitis.  There was also intermetatarsal erosions along the base of the second and third metatarsal with medial erosion along the first metatarsal head all of these favored gout.  I am asked to see the patient for antibiotic management of the foot infection. He is going for surgery tomorrow. PMH Cataract CAD Depression DM DM associated Retinopathy Hyperlipidemia HTN   Past Surgical History:  Procedure Laterality Date   BACK SURGERY     CHOLECYSTECTOMY-laparoscopic     HERNIA REPAIR-Abdominal     Retinal surgery left   SH Never smoker Lives with his wife    Family History  Problem Relation Age of Onset   COPD Mother    Heart disease Father    COPD Father    Allergies  Allergen Reactions   Empagliflozin Other (See Comments)    Pancreatitis    Liraglutide Other (See Comments)    Pancreatitis     ? Current Facility-Administered Medications  Medication Dose Route Frequency Provider Last Rate Last Dose   acetaminophen (TYLENOL) tablet 650 mg  650 mg Oral Q6H PRN Henreitta Leber, MD   650 mg at 12/12/18 1047   Or   acetaminophen (TYLENOL) suppository 650 mg  650 mg Rectal Q6H PRN [place] SirenSainani, Vivek J, MD       aspirin EC tablet 81 mg  81 mg Oral QHS [place] SirenSainani, Vivek J, MD   81 mg at 12/11/18 2219   brimonidine (ALPHAGAN) 0.2 % ophthalmic solution 1 drop  1 drop Both Eyes BID [place] SirenSainani, Vivek J, MD   1 drop at 12/12/18 0823   enoxaparin (LOVENOX) injection 40 mg  40 mg Subcutaneous Q24H [place] SirenSainani, Vivek J, MD   40 mg at 12/11/18 2219   gabapentin (NEURONTIN) capsule 300 mg  300 mg Oral QPM [place] SirenSainani, Vivek J, MD   300 mg at 12/11/18 1732   insulin aspart (novoLOG) injection 0-5 Units  0-5 Units Subcutaneous QHS [place] SirenSainani, Vivek J, MD   4 Units at 12/11/18  2219   insulin aspart (novoLOG) injection 0-9 Units  0-9 Units Subcutaneous TID WC [place] SirenSainani, Vivek J, MD   1 Units at 12/11/18 1732   latanoprost (XALATAN) 0.005 % ophthalmic solution 1 drop  1 drop Both Eyes QHS [place] SirenSainani, Vivek J, MD   1 drop at 12/11/18 2220   lisinopril (ZESTRIL) tablet 10 mg  10 mg Oral Daily [place] SirenSainani, Vivek J, MD   10 mg at 12/12/18 1047   Melatonin TABS 10 mg  10 mg Oral QHS PRN [place] SirenSainani, Vivek J, MD   10 mg at 12/11/18 2218   metFORMIN (GLUCOPHAGE) tablet 1,000 mg  1,000 mg Oral QHS [place] SirenSainani, Vivek J, MD   1,000 mg at 12/11/18 2219   morphine 2 MG/ML injection 2 mg  2 mg Intravenous Q4H PRN Enid BaasKalisetti, Radhika, MD       ondansetron (ZOFRAN) tablet 4 mg  4 mg Oral Q6H PRN [place] SirenSainani, Vivek J, MD       Or   ondansetron (ZOFRAN) injection 4 mg  4 mg Intravenous Q6H PRN [place] SirenSainani, Vivek J, MD       oxyCODONE (Oxy IR/ROXICODONE) immediate release tablet 5-10 mg  5-10 mg Oral Q4H PRN Enid BaasKalisetti, Radhika, MD   10 mg at 12/12/18 1415   piperacillin-tazobactam (ZOSYN) IVPB 3.375 g  3.375 g Intravenous Q8H Nazari, Walid A, RPH 12.5 mL/hr at 12/12/18 1418 3.375 g at 12/12/18 1418   pravastatin (PRAVACHOL) tablet 80 mg  80 mg Oral QHS [place] SirenSainani, Vivek J, MD       sertraline (ZOLOFT) tablet 100 mg  100 mg Oral QHS [place] SirenSainani, Vivek J, MD   100 mg at 12/11/18 2218   [START ON 12/13/2018] vancomycin (VANCOCIN) IVPB 1000 mg/200 mL premix  1,000 mg Intravenous Q24H Duncan, Asajah R, RPH         Abtx:  Anti-infectives (From admission, onward)   Start     Dose/Rate Route Frequency Ordered Stop   12/13/18 0800  vancomycin (VANCOCIN) IVPB 1000 mg/200 mL premix     1,000 mg 200 mL/hr over 60 Minutes Intravenous Every 24 hours 12/12/18 0846     12/12/18 0600  vancomycin (VANCOCIN) 1,750 mg in sodium chloride 0.9 % 500 mL IVPB  Status:  Discontinued     1,750 mg 250 mL/hr over 120 Minutes Intravenous Every 24 hours 12/11/18 1815 12/12/18 0846   12/11/18 2200  piperacillin-tazobactam (ZOSYN) IVPB  3.375 g     3.375 g 12.5 mL/hr over 240 Minutes Intravenous Every 8 hours 12/11/18 1815     12/11/18 1400  vancomycin (VANCOCIN) 2,000 mg in sodium chloride 0.9 % 500 mL IVPB     2,000 mg 250 mL/hr over 120 Minutes Intravenous  Once 12/11/18 1351 12/11/18 1650  12/11/18 1345  piperacillin-tazobactam (ZOSYN) IVPB 3.375 g     3.375 g 100 mL/hr over 30 Minutes Intravenous  Once 12/11/18 1337 12/11/18 1603   12/11/18 1345  vancomycin (VANCOCIN) IVPB 1000 mg/200 mL premix  Status:  Discontinued     1,000 mg 200 mL/hr over 60 Minutes Intravenous  Once 12/11/18 1337 12/11/18 1351      REVIEW OF SYSTEMS:  Const: fever,  chills, negative weight loss Eyes: negative diplopia or visual changes, negative eye pain ENT: negative coryza, negative sore throat Resp: negative cough, hemoptysis, dyspnea Cards: negative for chest pain, palpitations, lower extremity edema GU: negative for frequency, dysuria and hematuria GI: Negative for abdominal pain, diarrhea, bleeding, constipation Skin: negative for rash and pruritus Heme: negative for easy bruising and gum/nose bleeding MS: negative for myalgias, arthralgias, back pain and muscle weakness Neurolo:negative for headaches, dizziness, vertigo, memory problems  Psych: negative for feelings of anxiety, depression  Endocrine: No polyuria or polydipsia allergy/Immunology-diabetic medications cause pancreatitis: Objective:  VITALS:  BP (!) 159/78 (BP Location: Left Arm)    Pulse 61    Temp 98.7 F (37.1 C) (Oral)    Resp 20    Ht  (1.778 m)    Wt 95.3 kg    SpO2 99%    BMI 30.13 kg/m  PHYSICAL EXAM:  General: Alert, cooperative, no distress, appears stated age.  Head: Normocephalic, without obvious abnormality, atraumatic. Eyes: Conjunctivae clear, anicteric sclerae. Pupils are equal ENT Nares normal. No drainage or sinus tenderness. Lips, mucosa, and tongue normal. No Thrush Neck: Supple, symmetrical, no adenopathy, thyroid: non tender no  carotid bruit and no JVD. Back: No CVA tenderness. Lungs: Clear to auscultation bilaterally. No Wheezing or Rhonchi. No rales. Heart: Regular rate and rhythm, no murmur, rub or gallop. Abdomen: Soft, non-tender,not distended. Bowel sounds normal. No masses Extremities: Right great toe plantar aspect there is a small ulcer of 1 cm The whole toe is mildly swollen and erythematous compared to the left side      Skin: No rashes or lesions. Or bruising Lymph: Cervical, supraclavicular normal. Neurologic: Grossly non-focal Pertinent Labs Lab Results CBC    Component Value Date/Time   WBC 7.7 12/12/2018 0325   RBC 3.89 (L) 12/12/2018 0325   HGB 12.3 (L) 12/12/2018 0325   HCT 36.7 (L) 12/12/2018 0325   PLT 175 12/12/2018 0325   MCV 94.3 12/12/2018 0325   MCH 31.6 12/12/2018 0325   MCHC 33.5 12/12/2018 0325   RDW 11.7 12/12/2018 0325   LYMPHSABS 1.2 12/11/2018 1402   MONOABS 0.9 12/11/2018 1402   EOSABS 0.1 12/11/2018 1402   BASOSABS 0.0 12/11/2018 1402    CMP Latest Ref Rng & Units 12/12/2018 12/11/2018 05/06/2018  Glucose 70 - 99 mg/dL 161(W) 960(A) 540(J)  BUN 6 - 20 mg/dL 81(X) 91(Y) 19  Creatinine 0.61 - 1.24 mg/dL 7.82(N) 5.62(Z) 3.08(M)  Sodium 135 - 145 mmol/L 140 137 138  Potassium 3.5 - 5.1 mmol/L 4.3 4.6 4.4  Chloride 98 - 111 mmol/L 107 104 100  CO2 22 - 32 mmol/L Calcium 8.9 - 10.3 mg/dL 9.2 9.1 9.7  Total Protein 6.5 - 8.1 g/dL - 7.8 8.0  Total Bilirubin 0.3 - 1.2 mg/dL - 0.6 0.7  Alkaline Phos 38 - 126 U/L - 69 62  AST 15 - 41 U/L - 20 23  ALT 0 - 44 U/L - 18 26      Microbiology: Recent Results (from the past 240 hour(s))  SARS  Coronavirus 2 (CEPHEID- Performed in [facility] lab), Hosp Order     Status: None   Collection Time: 12/11/18  2:02 PM   Specimen: Nasopharyngeal Swab  Result Value Ref Range Status   SARS Coronavirus 2 NEGATIVE NEGATIVE Final    Comment: (NOTE) If result is NEGATIVE SARS-CoV-2 target nucleic acids are NOT  DETECTED. The SARS-CoV-2 RNA is generally detectable in upper and lower  respiratory specimens during the acute phase of infection. The lowest  concentration of SARS-CoV-2 viral copies this assay can detect is 250  copies / mL. A negative result does not preclude SARS-CoV-2 infection  and should not be used as the sole basis for treatment or other  patient management decisions.  A negative result may occur with  improper specimen collection / handling, submission of specimen other  than nasopharyngeal swab, presence of viral mutation(s) within the  areas targeted by this assay, and inadequate number of viral copies  (<250 copies / mL). A negative result must be combined with clinical  observations, patient history, and epidemiological information. If result is POSITIVE SARS-CoV-2 target nucleic acids are DETECTED. The SARS-CoV-2 RNA is generally detectable in upper and lower  respiratory specimens dur ing the acute phase of infection.  Positive  results are indicative of active infection with SARS-CoV-2.  Clinical  correlation with patient history and other diagnostic information is  necessary to determine patient infection status.  Positive results do  not rule out bacterial infection or co-infection with other viruses. If result is PRESUMPTIVE POSTIVE SARS-CoV-2 nucleic acids MAY BE PRESENT.   A presumptive positive result was obtained on the submitted specimen  and confirmed on repeat testing.  While 2019 novel coronavirus  (SARS-CoV-2) nucleic acids may be present in the submitted sample  additional confirmatory testing may be necessary for epidemiological  and / or clinical management purposes  to differentiate between  SARS-CoV-2 and other Sarbecovirus currently known to infect humans.  If clinically indicated additional testing with an alternate test  methodology [id number]) is advised. The SARS-CoV-2 RNA is generally  detectable in upper and lower respiratory sp ecimens during  the acute  phase of infection. The expected result is Negative. Fact Sheet for Patients:  BoilerBrush.com.cyhttps://www.fda.gov/media/136312/download Fact Sheet for Healthcare Providers: https://pope.com/https://www.fda.gov/media/136313/download This test is not yet approved or cleared by the [place] FDA and has been authorized for detection and/or diagnosis of SARS-CoV-2 by FDA under an Emergency Use Authorization (EUA).  This EUA will remain in effect (meaning this test can be used) for the duration of the COVID-19 declaration under Section 564(b)(1) of the Act, 21 U.S.C. section 360bbb-3(b)(1), unless the authorization is terminated or revoked sooner. Performed at [facility], [address] [place], [place] [address]     IMAGING RESULTS: I have personally reviewed the films ? Impression/Recommendation Diabetic foot infection with right great toe ulcer on the plantar aspect.  MRI is suggestive for osteomyelitis with marrow edema.  Patient was given option by Dr. Ether GriffinsFowler to either go for amputation or debridement and if patient chose debridement because he did not want amputation now. Depending on the culture which was taken in Dr. Irene LimboFowler's office we will decide on antibiotics for discharge.  Also biopsy of the wound will be sent during surgery so as to confirm osteomyelitis.  Currently he is on vancomycin and Zosyn.  Because of minimal increase in creatinine will avoid this combination.  We will DC Zosyn and give cefepime.  Final antibiotic recommendations can only be made after  the cultures have been resulted ? ? Diabetes mellitus with neuropathy and retinopathy ?He is on insulin.  Hyperlipidemia on pravastatin.  Hypertension on lisinopril  ___________________________________________________ Discussed with patient, ID will follow along. Note:  This document was prepared using Dragon voice recognition software and may include unintentional dictation errors.

## 2018-12-13 ENCOUNTER — Inpatient Hospital Stay: Payer: BC Managed Care – PPO | Admitting: Certified Registered Nurse Anesthetist

## 2018-12-13 ENCOUNTER — Encounter: Payer: Self-pay | Admitting: *Deleted

## 2018-12-13 ENCOUNTER — Encounter
Admission: EM | Disposition: A | Discharge status: Home / Self Care | Payer: Self-pay | Source: Home / Self Care | Attending: Internal Medicine

## 2018-12-13 HISTORY — PX: INCISION AND DRAINAGE: SHX5863

## 2018-12-13 LAB — GLUCOSE, CAPILLARY
Glucose-Capillary: 147 mg/dL — ABNORMAL HIGH (ref 70–99)
Glucose-Capillary: 163 mg/dL — ABNORMAL HIGH (ref 70–99)
Glucose-Capillary: 272 mg/dL — ABNORMAL HIGH (ref 70–99)
Glucose-Capillary: 372 mg/dL — ABNORMAL HIGH (ref 70–99)
Glucose-Capillary: 365 mg/dL — ABNORMAL HIGH (ref 70–99)

## 2018-12-13 LAB — BASIC METABOLIC PANEL
Anion gap: 6 (ref 5–15)
BUN: 30 mg/dL — ABNORMAL HIGH (ref 6–20)
CO2: 25 mmol/L (ref 22–32)
Calcium: 8.9 mg/dL (ref 8.9–10.3)
Chloride: 106 mmol/L (ref 98–111)
Creatinine, Ser: 1.65 mg/dL — ABNORMAL HIGH (ref 0.61–1.24)
GFR calc Af Amer: 52 mL/min — ABNORMAL LOW (ref >60)
GFR calc non Af Amer: 45 mL/min — ABNORMAL LOW (ref >60)
Glucose, Bld: 159 mg/dL — ABNORMAL HIGH (ref 70–99)
Potassium: 4.1 mmol/L (ref 3.5–5.1)
Sodium: 137 mmol/L (ref 135–145)

## 2018-12-13 SURGERY — INCISION AND DRAINAGE
Anesthesia: General | Laterality: Right

## 2018-12-13 MED ORDER — ACETAMINOPHEN 10 MG/ML IV SOLN
INTRAVENOUS | Status: DC | PRN
  Administered 2018-12-13: 1000 mg via INTRAVENOUS

## 2018-12-13 MED ORDER — LIDOCAINE HCL (CARDIAC) PF 100 MG/5ML IV SOSY
PREFILLED_SYRINGE | INTRAVENOUS | Status: DC | PRN
  Administered 2018-12-13: 100 mg via INTRAVENOUS

## 2018-12-13 MED ORDER — FENTANYL CITRATE (PF) 100 MCG/2ML IJ SOLN: 25.0000 ug | INTRAMUSCULAR | Status: DC | PRN

## 2018-12-13 MED ORDER — OXYCODONE HCL 5 MG PO TABS: 5.0000 mg | ORAL_TABLET | Freq: Once | ORAL | Status: DC | PRN

## 2018-12-13 MED ORDER — METHYLENE BLUE 0.5 % INJ SOLN
INTRAVENOUS | Status: AC
  Filled 2018-12-13: qty 10

## 2018-12-13 MED ORDER — SODIUM CHLORIDE 0.9 % IV SOLN
INTRAVENOUS | Status: DC | PRN
  Administered 2018-12-13 (×2): 15 mL via INTRAVENOUS
  Administered 2018-12-14: 250 mL via INTRAVENOUS

## 2018-12-13 MED ORDER — OXYCODONE HCL 5 MG/5ML PO SOLN: 5.0000 mg | Freq: Once | ORAL | Status: DC | PRN

## 2018-12-13 MED ORDER — VANCOMYCIN HCL 10 G IV SOLR
1500.0000 mg | INTRAVENOUS | Status: DC
  Administered 2018-12-13 – 2018-12-14 (×2): 1500 mg via INTRAVENOUS
  Filled 2018-12-13 (×5): qty 1500

## 2018-12-13 MED ORDER — SODIUM CHLORIDE 0.9 % IV SOLN
2.0000 g | INTRAVENOUS | Status: DC
  Administered 2018-12-13 – 2018-12-14 (×2): 2 g via INTRAVENOUS
  Filled 2018-12-13: qty 20
  Filled 2018-12-13 (×2): qty 2

## 2018-12-13 MED ORDER — INSULIN GLARGINE 100 UNIT/ML ~~LOC~~ SOLN
10.0000 [IU] | Freq: Every day | SUBCUTANEOUS | Status: DC
  Administered 2018-12-13: 10 [IU] via SUBCUTANEOUS
  Filled 2018-12-13 (×2): qty 0.1

## 2018-12-13 MED ORDER — SODIUM CHLORIDE 0.9 % IV SOLN
INTRAVENOUS | Status: DC | PRN
  Administered 2018-12-13: 08:00:00 via INTRAVENOUS

## 2018-12-13 MED ORDER — SODIUM CHLORIDE 0.9 % IV SOLN
2.0000 g | Freq: Two times a day (BID) | INTRAVENOUS | Status: DC
  Filled 2018-12-13 (×2): qty 2

## 2018-12-13 MED ORDER — ONDANSETRON HCL 4 MG/2ML IJ SOLN
INTRAMUSCULAR | Status: DC | PRN
  Administered 2018-12-13: 4 mg via INTRAVENOUS

## 2018-12-13 MED ORDER — LIDOCAINE-EPINEPHRINE 1 %-1:100000 IJ SOLN
INTRAMUSCULAR | Status: AC
  Filled 2018-12-13: qty 1

## 2018-12-13 MED ORDER — FENTANYL CITRATE (PF) 100 MCG/2ML IJ SOLN
INTRAMUSCULAR | Status: DC | PRN
  Administered 2018-12-13: 25 ug via INTRAVENOUS
  Administered 2018-12-13: 50 ug via INTRAVENOUS
  Administered 2018-12-13: 25 ug via INTRAVENOUS

## 2018-12-13 MED ORDER — BUPIVACAINE HCL (PF) 0.5 % IJ SOLN
INTRAMUSCULAR | Status: AC
  Filled 2018-12-13: qty 30

## 2018-12-13 MED ORDER — ACETAMINOPHEN 10 MG/ML IV SOLN
INTRAVENOUS | Status: AC
  Filled 2018-12-13: qty 100

## 2018-12-13 MED ORDER — PROPOFOL 10 MG/ML IV BOLUS
INTRAVENOUS | Status: AC
  Filled 2018-12-13: qty 80

## 2018-12-13 MED ORDER — BUPIVACAINE HCL (PF) 0.25 % IJ SOLN
INTRAMUSCULAR | Status: AC
  Filled 2018-12-13: qty 30

## 2018-12-13 MED ORDER — LIDOCAINE-EPINEPHRINE 1 %-1:100000 IJ SOLN
INTRAMUSCULAR | Status: DC | PRN
  Administered 2018-12-13: 3 mL

## 2018-12-13 MED ORDER — BUPIVACAINE HCL 0.5 % IJ SOLN
INTRAMUSCULAR | Status: DC | PRN
  Administered 2018-12-13: 7 mL
  Administered 2018-12-13: 10 mL

## 2018-12-13 MED ORDER — PROPOFOL 10 MG/ML IV BOLUS
INTRAVENOUS | Status: DC | PRN
  Administered 2018-12-13: 150 mg via INTRAVENOUS
  Administered 2018-12-13: 50 mg via INTRAVENOUS

## 2018-12-13 MED ORDER — MIDAZOLAM HCL 2 MG/2ML IJ SOLN
INTRAMUSCULAR | Status: AC
  Filled 2018-12-13: qty 2

## 2018-12-13 MED ORDER — DEXAMETHASONE SODIUM PHOSPHATE 10 MG/ML IJ SOLN
INTRAMUSCULAR | Status: DC | PRN
  Administered 2018-12-13: 5 mg via INTRAVENOUS

## 2018-12-13 MED ORDER — VANCOMYCIN HCL 1000 MG IV SOLR
INTRAVENOUS | Status: AC
  Filled 2018-12-13: qty 1000

## 2018-12-13 MED ORDER — MIDAZOLAM HCL 2 MG/2ML IJ SOLN
INTRAMUSCULAR | Status: DC | PRN
  Administered 2018-12-13: 2 mg via INTRAVENOUS

## 2018-12-13 MED ORDER — EPHEDRINE SULFATE 50 MG/ML IJ SOLN
INTRAMUSCULAR | Status: DC | PRN
  Administered 2018-12-13: 7.5 mg via INTRAVENOUS
  Administered 2018-12-13: 5 mg via INTRAVENOUS

## 2018-12-13 MED ORDER — FENTANYL CITRATE (PF) 100 MCG/2ML IJ SOLN
INTRAMUSCULAR | Status: AC
  Filled 2018-12-13: qty 2

## 2018-12-13 SURGICAL SUPPLY — 64 items
BANDAGE ACE 4X5 VEL STRL LF (GAUZE/BANDAGES/DRESSINGS) ×2 IMPLANT
BLADE OSC/SAGITTAL MD 5.5X18 (BLADE) IMPLANT
BLADE OSCILLATING/SAGITTAL (BLADE)
BLADE SURG 15 STRL LF DISP TIS (BLADE) ×1 IMPLANT
BLADE SURG 15 STRL SS (BLADE) ×1
BLADE SW THK.38XMED LNG THN (BLADE) IMPLANT
BNDG COHESIVE 4X5 TAN STRL (GAUZE/BANDAGES/DRESSINGS) ×2 IMPLANT
BNDG COHESIVE 6X5 TAN STRL LF (GAUZE/BANDAGES/DRESSINGS) ×2 IMPLANT
BNDG CONFORM 2 STRL LF (GAUZE/BANDAGES/DRESSINGS) ×2 IMPLANT
BNDG CONFORM 3 STRL LF (GAUZE/BANDAGES/DRESSINGS) IMPLANT
BNDG ESMARK 4X12 TAN STRL LF (GAUZE/BANDAGES/DRESSINGS) ×2 IMPLANT
BNDG GAUZE 4.5X4.1 6PLY STRL (MISCELLANEOUS) ×2 IMPLANT
CANISTER SUCT 1200ML W/VALVE (MISCELLANEOUS) ×2 IMPLANT
CANISTER SUCT 3000ML PPV (MISCELLANEOUS) ×2 IMPLANT
CANISTER WOUND CARE 500ML ATS (WOUND CARE) ×2 IMPLANT
COVER WAND RF STERILE (DRAPES) ×2 IMPLANT
CUFF TOURN SGL QUICK 12 (TOURNIQUET CUFF) IMPLANT
CUFF TOURN SGL QUICK 18X4 (TOURNIQUET CUFF) ×2 IMPLANT
DRAPE FLUOR MINI C-ARM 54X84 (DRAPES) IMPLANT
DRAPE XRAY CASSETTE 23X24 (DRAPES) IMPLANT
DRESSING ALLEVYN 4X4 (MISCELLANEOUS) IMPLANT
DURAPREP 26ML APPLICATOR (WOUND CARE) ×2 IMPLANT
ELECT REM PT RETURN 9FT ADLT (ELECTROSURGICAL) ×2
ELECTRODE REM PT RTRN 9FT ADLT (ELECTROSURGICAL) ×1 IMPLANT
GAUZE PACKING 1/4 X5 YD (GAUZE/BANDAGES/DRESSINGS) ×2 IMPLANT
GAUZE PACKING IODOFORM 1X5 (MISCELLANEOUS) ×2 IMPLANT
GAUZE SPONGE 4X4 12PLY STRL (GAUZE/BANDAGES/DRESSINGS) ×2 IMPLANT
GAUZE XEROFORM 1X8 LF (GAUZE/BANDAGES/DRESSINGS) ×2 IMPLANT
GLOVE BIO SURGEON STRL SZ7.5 (GLOVE) ×2 IMPLANT
GLOVE INDICATOR 8.0 STRL GRN (GLOVE) ×2 IMPLANT
GOWN STRL REUS W/ TWL LRG LVL3 (GOWN DISPOSABLE) ×2 IMPLANT
GOWN STRL REUS W/TWL LRG LVL3 (GOWN DISPOSABLE) ×2
GOWN STRL REUS W/TWL MED LVL3 (GOWN DISPOSABLE) ×2 IMPLANT
HANDPIECE VERSAJET DEBRIDEMENT (MISCELLANEOUS) IMPLANT
IV NS 1000ML (IV SOLUTION) ×1
IV NS 1000ML BAXH (IV SOLUTION) ×1 IMPLANT
KIT DRSG VAC SLVR GRANUFM (MISCELLANEOUS) ×2 IMPLANT
KIT TURNOVER KIT A (KITS) ×2 IMPLANT
LABEL OR SOLS (LABEL) ×2 IMPLANT
NEEDLE FILTER BLUNT 18X 1/2SAF (NEEDLE) ×1
NEEDLE FILTER BLUNT 18X1 1/2 (NEEDLE) ×1 IMPLANT
NEEDLE HYPO 25X1 1.5 SAFETY (NEEDLE) ×2 IMPLANT
NS IRRIG 500ML POUR BTL (IV SOLUTION) ×2 IMPLANT
PACK EXTREMITY ARMC (MISCELLANEOUS) ×2 IMPLANT
PAD ABD DERMACEA PRESS 5X9 (GAUZE/BANDAGES/DRESSINGS) ×2 IMPLANT
PENCIL ELECTRO HAND CTR (MISCELLANEOUS) ×2 IMPLANT
PULSAVAC PLUS IRRIG FAN TIP (DISPOSABLE)
RASP SM TEAR CROSS CUT (RASP) IMPLANT
SHIELD FULL FACE ANTIFOG 7M (MISCELLANEOUS) ×2 IMPLANT
SOL .9 NS 3000ML IRR  AL (IV SOLUTION) ×1
SOL .9 NS 3000ML IRR UROMATIC (IV SOLUTION) ×1 IMPLANT
STOCKINETTE IMPERVIOUS 9X36 MD (GAUZE/BANDAGES/DRESSINGS) ×2 IMPLANT
SUT ETHILON 2 0 FS 18 (SUTURE) ×4 IMPLANT
SUT ETHILON 4-0 (SUTURE) ×1
SUT ETHILON 4-0 FS2 18XMFL BLK (SUTURE) ×1
SUT VIC AB 3-0 SH 27 (SUTURE) ×1
SUT VIC AB 3-0 SH 27X BRD (SUTURE) ×1 IMPLANT
SUT VIC AB 4-0 FS2 27 (SUTURE) ×2 IMPLANT
SUTURE ETHLN 4-0 FS2 18XMF BLK (SUTURE) ×1 IMPLANT
SWAB CULTURE AMIES ANAERIB BLU (MISCELLANEOUS) IMPLANT
SYR 10ML LL (SYRINGE) ×2 IMPLANT
SYR 3ML LL SCALE MARK (SYRINGE) ×2 IMPLANT
TIP FAN IRRIG PULSAVAC PLUS (DISPOSABLE) IMPLANT
TRAY PREP VAG/GEN (MISCELLANEOUS) ×2 IMPLANT

## 2018-12-13 NOTE — Progress Notes (Signed)
Inpatient Diabetes Program Recommendations  AACE/ADA: New Consensus Statement on Inpatient Glycemic Control (2015)  Target Ranges:  Prepandial:   less than 140 mg/dL      Peak postprandial:   less than 180 mg/dL (1-2 hours)      Critically ill patients:  140 - 180 mg/dL   Lab Results  Component Value Date   GLUCAP 272 (H) 12/13/2018   HGBA1C 11.0 (H) 12/11/2018    Review of Glycemic Control Results for LARELL, BANEY (MRN [id number]) as of 12/13/2018 12:42  Ref. Range 12/12/2018 16:48 12/12/2018 21:15 12/13/2018 07:28 12/13/2018 08:58 12/13/2018 11:49  Glucose-Capillary Latest Ref Range: 70 - 99 mg/dL 184 (H) 191 (H) 147 (H) 163 (H) 272 (H)   Diabetes history: DM2 Outpatient Diabetes medications: Tresiba 88-98 units daily + Metformin 1 gm bid + Novolog 0-14 units tid ac meals Current orders for Inpatient glycemic control: Metformin 1 gm q hs + Novolog sensitive tid + hs 0-5  Inpatient Diabetes Program Recommendations:   Noted patient in OR for procedure this am. -Lantus 10 units daily and adjust as needed  Thank you, Bethena Roys E. Hopelynn Gartland, RN, MSN, CDE  Diabetes Coordinator Inpatient Glycemic Control Team Team Pager [phone] (8am-5pm) 12/13/2018 12:43 PM

## 2018-12-13 NOTE — Progress Notes (Signed)
Requested missing dose of vancomycin (2000 dose) to be sent and  time due adjusted.

## 2018-12-13 NOTE — Progress Notes (Signed)
ID Discussed with Dr.Fowler Bone appeared intact during surgical exploration He may be able to get oral antibiotic like augmentin on discharged as the wound culture had GBS ( before antibiotics were started)  Dr.Fowler will decide tomorrow Discussed with patient as well Change cefepime to ceftriaxone- will DC vanco tomorrow if culture has no MRSA

## 2018-12-13 NOTE — Progress Notes (Signed)
She underwent I&D of the right great toe today.  The bone and capsule were evaluated and not found to be erosive or open.  I am still awaiting the culture that is in process taken in the office on Monday.  Continue to monitor for the culture results.  A deep wound culture was performed intraoperatively.

## 2018-12-13 NOTE — Op Note (Signed)
Operative note   Surgeon:Amirah Goerke Lawyer: None    Preop diagnosis: Abscess right great toe with questionable osteomyelitis    Postop diagnosis: Same    Procedure: Incision and drainage abscess right great toe    EBL: Minimal    Anesthesia:local and general.  Local consisted of a total of 17 mL's of 0.5% bupivacaine around the first ray and great toe incision was infiltrated with 3 cc of 1% lidocaine epinephrine.    Hemostasis: Lidocaine with epinephrine along the incision site    Specimen: Deep wound culture abscess right time    Complications: None    Operative indications:Troy Morse is an 58 y.o. that presents today for surgical intervention.  The risks/benefits/alternatives/complications have been discussed and consent has been given.    Procedure:  Patient was brought into the OR and placed on the operating table in thesupine position. After anesthesia was obtained theright lower extremity was prepped and draped in usual sterile fashion.  Attention was directed to the plantar aspect of the right great toe where the noted ulceration was found with draining abscess to the area.  The ulceration was at the interphalangeal joint.  Proximal and distal incisions were created.  Sharp and blunt dissection carried down to the long flexor tendon.  The ulcerative site was excisionally excised down to the tendon.  The tendon was evaluated and not found to be torn or erosive.  Incision was carried back proximal exposing the tendon and tendon sheath to the level of the MTPJ approximately.  Purulent drainage was noted throughout the area.  This was cultured initially and then flushed with copious amounts of irrigation.  The tendon was retracted medial and lateral and the interphalangeal joint proximal phalanx and distal phalanx were evaluated.  There were no areas of erosive bone or breakthrough the capsule.  I did not elect to perform a bone biopsy at this time as the capsule was  still intact without obvious osteomyelitis or soft areas of bone.  Final flush was performed.  Closure was performed of the skin with 3-0 nylon.  The central aspect of the wound was packed with iodoform packing.     Patient tolerated the procedure and anesthesia well.  Was transported from the OR to the PACU with all vital signs stable and vascular status intact. To be discharged per routine protocol.  Will follow up in approximately 1 week in the outpatient clinic.

## 2018-12-13 NOTE — Transfer of Care (Signed)
Immediate Anesthesia Transfer of Care Note  Patient: Troy Morse  Procedure(s) Performed: INCISION AND DRAINAGE GREAT RIGHT TOE (Right )  Patient Location: PACU  Anesthesia Type:General  Level of Consciousness: awake, drowsy and patient cooperative  Airway & Oxygen Therapy: Patient Spontanous Breathing and Patient connected to face mask oxygen  Post-op Assessment: Report given to RN, Post -op Vital signs reviewed and stable and Patient moving all extremities  Post vital signs: Reviewed and stable  Last Vitals:  Vitals Value Taken Time  BP    Temp    Pulse    Resp    SpO2      Last Pain:  Vitals:   12/13/18 0758  TempSrc: Tympanic  PainSc:          Complications: No apparent anesthesia complications

## 2018-12-13 NOTE — Progress Notes (Signed)
[employer] [facility] [place] [facility]   PATIENT NAME: Troy EatonKenneth Morse    MR#:  [id number]  DATE OF BIRTH:  01/30/1961  SUBJECTIVE:  CHIEF COMPLAINT:   Chief Complaint  Patient presents with  . Wound Infection   -Status post wound debridement of the right great toe in the OR today.  No bone infection identified -Continue IV antibiotics  REVIEW OF SYSTEMS:  Review of Systems  Constitutional: Negative for chills, fever and malaise/fatigue.  HENT: Negative for congestion, ear discharge, hearing loss and nosebleeds.   Eyes: Negative for blurred vision and double vision.  Respiratory: Negative for cough, shortness of breath and wheezing.   Cardiovascular: Negative for chest pain, palpitations and leg swelling.  Gastrointestinal: Negative for abdominal pain, constipation, diarrhea, nausea and vomiting.  Genitourinary: Negative for dysuria.  Musculoskeletal: Positive for joint pain and myalgias.  Neurological: Negative for dizziness, focal weakness, seizures, weakness and headaches.  Psychiatric/Behavioral: Negative for depression.    DRUG ALLERGIES:   Allergies  Allergen Reactions  . Empagliflozin Other (See Comments)    Pancreatitis   . Liraglutide Other (See Comments)    Pancreatitis     VITALS:  Blood pressure (!) 152/65, pulse 70, temperature 98 F (36.7 C), temperature source Oral, resp. rate 18, height 5\' 10"  (1.778 m), weight 95.3 kg, SpO2 96 %.  PHYSICAL EXAMINATION:  Physical Exam  GENERAL:  58 y.o.-year-old patient lying in the bed with no acute distress.  EYES: Pupils equal, round, reactive to light and accommodation. No scleral icterus. Extraocular muscles intact.  HEENT: Head atraumatic, normocephalic. Oropharynx and nasopharynx clear.  NECK:  Supple, no jugular venous distention. No thyroid enlargement, no tenderness.  LUNGS: Normal breath sounds bilaterally, no wheezing, rales,rhonchi or crepitation. No use of accessory muscles of  respiration.  CARDIOVASCULAR: S1, S2 normal. No murmurs, rubs, or gallops.  ABDOMEN: Soft, nontender, nondistended. Bowel sounds present. No organomegaly or mass.  EXTREMITIES: right foot first toe plantar wound. No pedal edema, cyanosis, or clubbing.  NEUROLOGIC: Cranial nerves II through XII are intact. Muscle strength 5/5 in all extremities. Sensation intact. Gait not checked.  PSYCHIATRIC: The patient is alert and oriented x 3.  SKIN: No obvious rash, lesion, or ulcer.       LABORATORY PANEL:   CBC Recent Labs  Lab 12/12/18 0325  WBC 7.7  HGB 12.3*  HCT 36.7*  PLT 175   ------------------------------------------------------------------------------------------------------------------  Chemistries  Recent Labs  Lab 12/11/18 1402  12/13/18 0520  NA 137   < > 137  K 4.6   < > 4.1  CL 104   < > 106  CO2 22   < > 25  GLUCOSE 148*   < > 159*  BUN 30*   < > 30*  CREATININE 1.47*   < > 1.65*  CALCIUM 9.1   < > 8.9  AST 20  --   --   ALT 18  --   --   ALKPHOS 69  --   --   BILITOT 0.6  --   --    < > = values in this interval not displayed.   ------------------------------------------------------------------------------------------------------------------  Cardiac Enzymes No results for input(s): TROPONINI in the last 168 hours. ------------------------------------------------------------------------------------------------------------------  RADIOLOGY:  Mr Foot Right Wo Contrast  Result Date: 12/11/2018 CLINICAL DATA:  Swelling of the foot, diabetes EXAM: MRI OF THE RIGHT FOREFOOT WITHOUT CONTRAST TECHNIQUE: Multiplanar, multisequence MR imaging of the right forefoot was performed. No intravenous contrast was administered. COMPARISON:  Radiographs from 12/11/2018 FINDINGS: Bones/Joint/Cartilage Abnormal edema signal in the proximal and distal phalanges of the great toe, especially the distal phalanx, suspicious for osteomyelitis. Erosion along the medial head of the  first metatarsal favoring gout. Erosions along the lateral intermetatarsal articulations of the bases of the second and third metatarsals, with adjacent marrow edema tracking in the base of the third metatarsal. Subtle marrow edema in the proximal metaphysis of the fourth metatarsal noted. Osteochondral lesion or erosion along the distal margin of the middle cuneiform medially. Ligaments The Lisfranc ligament appears intact on image 9/9. Muscles and Tendons Mild flexor hallucis longus tenosynovitis. Patchy edema signal in the abductor hallucis, flexor hallucis brevis, and in the interosseous muscles especially between the first and second metatarsals. Soft tissues Mild subcutaneous edema tracks along the dorsum of the foot and into the great toe. IMPRESSION: 1. Abnormal marrow edema in the distal phalanx and to a lesser extent in the proximal phalanx of the great toe, suspicious for osteomyelitis. There is surrounding soft tissue edema in the great toe likely from cellulitis, possibly a small plantar ulcer along the great toe. 2. Intermetatarsal erosions along the bases of the second and third metatarsals. Medial erosion along the first metatarsal head. Appearance favors gout. 3. Non-fragmented osteochondral lesion or erosion along the distal margin of the middle cuneiform. 4. Mild flexor hallucis longus tenosynovitis distally. 5. Patchy edema in some muscles in the forefoot which could be from myositis or neurogenic edema. Electronically Signed   By: Van Clines M.D.   On: 12/11/2018 19:49    EKG:  No orders found for this or any previous visit.  ASSESSMENT AND PLAN:   58 year old male with past medical history significant for hypertension, diabetes, neuropathy presents to hospital secondary to right foot great toe ulceration without improvement on oral antibiotics.  1.  Right foot great toe osteomyelitis-x-rays have been reviewed, no osteomyelitis noted initially -MRI of the foot showing possible  osteomyelitis with soft tissue edema.  Appreciate podiatry consult. -Status post wound debridement in the OR and deep wound cultures have been sent for. -No erosion noted on the bone.  Appreciate ID input. -Follow-up cultures and patient might need IV antibiotics at discharge. -Currently on vancomycin and cefepime   2.  Hypertension-on lisinopril  3.  Neuropathy-on gabapentin  4.  Diabetes mellitus-started Lantus and continue sliding scale insulin.  Also on metformin at home  5.  DVT prophylaxis-on Lovenox  Independent at baseline     All the records are reviewed and case discussed with Care Management/Social Workerr. Management plans discussed with the patient, family and they are in agreement.  CODE STATUS: Full code  TOTAL TIME TAKING CARE OF THIS PATIENT: 38 minutes.   POSSIBLE D/C IN 2-3 DAYS, DEPENDING ON CLINICAL CONDITION.   Gladstone Lighter M.D on 12/13/2018 at 2:52 PM  Between 7am to 6pm - Pager - [phone]  After 6pm go to [email or web address] - password [email or web address] [place] [facility]  Office  [phone]  CC: Primary care physician; Patient, No Pcp Per

## 2018-12-13 NOTE — Anesthesia Post-op Follow-up Note (Signed)
Anesthesia QCDR form completed.        

## 2018-12-13 NOTE — TOC Progression Note (Signed)
Transition of Care [facility]) - Progression Note    Patient Details  Name: Troy Morse MRN: [id number] Date of Birth: 11-20-1960  [facility] of [facility] CM/SW Contact  Katrina Stack, RN Phone Number: 12/13/2018, 2:18 PM  Clinical Narrative:   Patient  had I/D of wound today.  There is a possibility of need for home IV antibiotics.  Carolynn Sayers from [facility] is following. Disposition pending cultures.  Patient verbalizes that he has willing and able caregivers that can assume administration responsibilities.    Expected Discharge Plan: [place] Barriers to Discharge: Continued Medical Work up  Expected Discharge Plan and Services Expected Discharge Plan: [place] arrangements for the past 2 months: Single Family Home                                       Social Determinants of Health (SDOH) Interventions    Readmission Risk Interventions No flowsheet data found.

## 2018-12-13 NOTE — Anesthesia Procedure Notes (Signed)
Procedure Name: LMA Insertion Date/Time: 12/13/2018 8:11 AM Performed by: Lowry Bowl, CRNA Pre-anesthesia Checklist: Patient identified, Emergency Drugs available, Suction available and Patient being monitored Patient Re-evaluated:Patient Re-evaluated prior to induction Oxygen Delivery Method: Circle system utilized Preoxygenation: Pre-oxygenation with 100% oxygen Induction Type: IV induction Ventilation: Mask ventilation without difficulty LMA: LMA inserted LMA Size: 4.5 Number of attempts: 1 Placement Confirmation: positive ETCO2 and breath sounds checked- equal and bilateral Tube secured with: Tape Dental Injury: Teeth and Oropharynx as per pre-operative assessment

## 2018-12-13 NOTE — Anesthesia Postprocedure Evaluation (Signed)
Anesthesia Post Note  Patient: Troy Morse  Procedure(s) Performed: INCISION AND DRAINAGE GREAT RIGHT TOE (Right )  Patient location during evaluation: PACU Anesthesia Type: General Level of consciousness: awake and alert Pain management: pain level controlled Vital Signs Assessment: post-procedure vital signs reviewed and stable Respiratory status: spontaneous breathing, nonlabored ventilation and respiratory function stable Cardiovascular status: blood pressure returned to baseline and stable Postop Assessment: no apparent nausea or vomiting Anesthetic complications: no     Last Vitals:  Vitals:   12/13/18 0947 12/13/18 1151  BP: (!) 155/71 (!) 152/65  Pulse: 72 70  Resp: 16 18  Temp: 36.5 C 36.7 C  SpO2: 90% 96%    Last Pain:  Vitals:   12/13/18 1550  TempSrc:   PainSc: [employer]

## 2018-12-13 NOTE — Consult Note (Signed)
Pharmacy Antibiotic Note  Troy Morse is a 58 y.o. male admitted on 12/11/2018 with osteomyelitis.  Pharmacy has been consulted for Vancomycin dosing. Patient was given option by Dr. Vickki Muff to either go for amputation or debridement and if patient chose debridement because he did not want amputation now. A culture which was taken in Dr. Alvera Singh office will direct antibiotics for discharge. His renal function is slightly higher than when admitted but appears to have stabilized  Plan:  1) Change Vancomycin to 1500 mg IV Q 24 hrs Expected AUC: 475 T1/2: 15.0 h SCr in am Expected Css: 10.8 mcg/ml SCr used: 1.65 (baseline 1.4-1.5 mg/dL)   2) Change cefepime dose to 2 grams IV every 12 hours   Height: 5\' 10"  (177.8 cm) Weight: 210 lb (95.3 kg) IBW/kg (Calculated) : 73  Temp (24hrs), Avg:98.9 F (37.2 C), Min:98.5 F (36.9 C), Max:99.4 F (37.4 C)  Recent Labs  Lab 12/11/18 1402 12/12/18 0325 12/13/18 0520  WBC 9.6 7.7  --   CREATININE 1.47* 1.62* 1.65*  LATICACIDVEN 1.4  --   --     Estimated Creatinine Clearance: 56.5 mL/min (A) (by C-G formula based on SCr of 1.65 mg/dL (H)).     Antimicrobials this admission: vancomycin 7/1 >>  cefepime 7/2 >> Zosyn 7/1 >> 7/2   Thank you for allowing pharmacy to be a part of this patient's care.  Dallie Piles, PharmD 12/13/2018 7:56 AM

## 2018-12-14 ENCOUNTER — Encounter: Payer: Self-pay | Admitting: Podiatry

## 2018-12-14 LAB — CBC
HCT: 35.4 % — ABNORMAL LOW (ref 39.0–52.0)
Hemoglobin: 12.1 g/dL — ABNORMAL LOW (ref 13.0–17.0)
MCH: 31.3 pg (ref 26.0–34.0)
MCHC: 34.2 g/dL (ref 30.0–36.0)
MCV: 91.5 fL (ref 80.0–100.0)
Platelets: 197 10*3/uL (ref 150–400)
RBC: 3.87 MIL/uL — ABNORMAL LOW (ref 4.22–5.81)
RDW: 11.3 % — ABNORMAL LOW (ref 11.5–15.5)
WBC: 9 10*3/uL (ref 4.0–10.5)
nRBC: 0 % (ref 0.0–0.2)

## 2018-12-14 LAB — BASIC METABOLIC PANEL
Anion gap: 10 (ref 5–15)
BUN: 25 mg/dL — ABNORMAL HIGH (ref 6–20)
CO2: 23 mmol/L (ref 22–32)
Calcium: 8.7 mg/dL — ABNORMAL LOW (ref 8.9–10.3)
Chloride: 100 mmol/L (ref 98–111)
Creatinine, Ser: 1.39 mg/dL — ABNORMAL HIGH (ref 0.61–1.24)
GFR calc Af Amer: >60 mL/min (ref >60)
GFR calc non Af Amer: 55 mL/min — ABNORMAL LOW (ref >60)
Glucose, Bld: 322 mg/dL — ABNORMAL HIGH (ref 70–99)
Potassium: 4.5 mmol/L (ref 3.5–5.1)
Sodium: 133 mmol/L — ABNORMAL LOW (ref 135–145)

## 2018-12-14 LAB — GLUCOSE, CAPILLARY
Glucose-Capillary: 284 mg/dL — ABNORMAL HIGH (ref 70–99)
Glucose-Capillary: 255 mg/dL — ABNORMAL HIGH (ref 70–99)
Glucose-Capillary: 210 mg/dL — ABNORMAL HIGH (ref 70–99)
Glucose-Capillary: 248 mg/dL — ABNORMAL HIGH (ref 70–99)

## 2018-12-14 LAB — HIV ANTIBODY (ROUTINE TESTING W REFLEX): HIV Screen 4th Generation wRfx: NONREACTIVE

## 2018-12-14 MED ORDER — POLYETHYLENE GLYCOL 3350 17 G PO PACK
17.0000 g | PACK | Freq: Every day | ORAL | Status: DC
  Administered 2018-12-14: 17 g via ORAL
  Filled 2018-12-14: qty 1

## 2018-12-14 MED ORDER — INSULIN GLARGINE 100 UNIT/ML ~~LOC~~ SOLN
20.0000 [IU] | Freq: Every day | SUBCUTANEOUS | Status: DC
  Administered 2018-12-14: 20 [IU] via SUBCUTANEOUS
  Filled 2018-12-14 (×2): qty 0.2

## 2018-12-14 MED ORDER — BISACODYL 10 MG RE SUPP
10.0000 mg | Freq: Every day | RECTAL | Status: DC | PRN
  Administered 2018-12-14: 10 mg via RECTAL
  Filled 2018-12-14: qty 1

## 2018-12-14 NOTE — Progress Notes (Signed)
[place] at [place] NAME: Troy Morse    MR#:  [id number]  DATE OF BIRTH:  [birth year]  SUBJECTIVE:  CHIEF COMPLAINT:   Chief Complaint  Patient presents with  . Wound Infection   -Status post wound debridement of the right great toe, pain is well controlled. -  No bone infection identified - complains of constipation  REVIEW OF SYSTEMS:  Review of Systems  Constitutional: Negative for chills, fever and malaise/fatigue.  HENT: Negative for congestion, ear discharge, hearing loss and nosebleeds.   Eyes: Negative for blurred vision and double vision.  Respiratory: Negative for cough, shortness of breath and wheezing.   Cardiovascular: Negative for chest pain, palpitations and leg swelling.  Gastrointestinal: Positive for constipation. Negative for abdominal pain, diarrhea, nausea and vomiting.  Genitourinary: Negative for dysuria.  Musculoskeletal: Positive for joint pain and myalgias.  Neurological: Negative for dizziness, focal weakness, seizures, weakness and headaches.  Psychiatric/Behavioral: Negative for depression.    DRUG ALLERGIES:   Allergies  Allergen Reactions  . Empagliflozin Other (See Comments)    Pancreatitis   . Liraglutide Other (See Comments)    Pancreatitis     VITALS:  Blood pressure 129/71, pulse 68, temperature 98 F (36.7 C), temperature source Oral, resp. rate 20, height 5\' 10"  (1.778 m), weight 95.3 kg, SpO2 95 %.  PHYSICAL EXAMINATION:  Physical Exam  GENERAL:  58 y.o.-year-old patient lying in the bed with no acute distress.  EYES: Pupils equal, round, reactive to light and accommodation. No scleral icterus. Extraocular muscles intact.  HEENT: Head atraumatic, normocephalic. Oropharynx and nasopharynx clear.  NECK:  Supple, no jugular venous distention. No thyroid enlargement, no tenderness.  LUNGS: Normal breath sounds bilaterally, no wheezing, rales,rhonchi or crepitation. No use of  accessory muscles of respiration.  CARDIOVASCULAR: S1, S2 normal. No murmurs, rubs, or gallops.  ABDOMEN: Soft, nontender, nondistended. Bowel sounds present. No organomegaly or mass.  EXTREMITIES: right foot first toe plantar wound. Dressing in place now No pedal edema, cyanosis, or clubbing.  NEUROLOGIC: Cranial nerves II through XII are intact. Muscle strength 5/5 in all extremities. Sensation intact. Gait not checked.  PSYCHIATRIC: The patient is alert and oriented x 3.  SKIN: No obvious rash, lesion, or ulcer.       LABORATORY PANEL:   CBC Recent Labs  Lab 12/14/18 0256  WBC 9.0  HGB 12.1*  HCT 35.4*  PLT 197   ------------------------------------------------------------------------------------------------------------------  Chemistries  Recent Labs  Lab 12/11/18 1402  12/14/18 0256  NA 137   < > 133*  K 4.6   < > 4.5  CL 104   < > 100  CO2 22   < > 23  GLUCOSE 148*   < > 322*  BUN 30*   < > 25*  CREATININE 1.47*   < > 1.39*  CALCIUM 9.1   < > 8.7*  AST 20  --   --   ALT 18  --   --   ALKPHOS 69  --   --   BILITOT 0.6  --   --    < > = values in this interval not displayed.   ------------------------------------------------------------------------------------------------------------------  Cardiac Enzymes No results for input(s): TROPONINI in the last 168 hours. ------------------------------------------------------------------------------------------------------------------  RADIOLOGY:  No results found.  EKG:  No orders found for this or any previous visit.  ASSESSMENT AND PLAN:   58 year old male with past medical history significant for hypertension, diabetes, neuropathy presents to hospital secondary  to right foot great toe ulceration without improvement on oral antibiotics.  1.  Right foot great toe osteomyelitis-x-rays have been reviewed, no osteomyelitis noted initially -MRI of the foot showing possible osteomyelitis with soft tissue edema.   Appreciate podiatry consult. -Status post wound debridement in the OR and deep wound cultures have been sent for. -No erosion noted on the bone.  Appreciate ID input. -Outpatient cultures growing group B strep, Intra-Op cultures pending and growing gram-positive cocci for now. -Currently on Rocephin and vancomycin.  If MRSA is negative on the cultures, will discontinue vancomycin -Possible need for oral antibiotics only at discharge.  Follow-up with podiatry regarding weightbearing status and physical therapy consult.  2.  Hypertension-on lisinopril  3.  Neuropathy-on gabapentin  4.  Diabetes mellitus-started Lantus-increase the dose as sugars are elevated and continue sliding scale insulin.  Also on metformin at home  5.  DVT prophylaxis-on Lovenox  6.  CKD-stage III, baseline creatinine around 1.4.  Continue to monitor  Independent at baseline Anticipate discharge in the next 1 to 2 days    All the records are reviewed and case discussed with Care Management/Social Workerr. Management plans discussed with the patient, family and they are in agreement.  CODE STATUS: Full code  TOTAL TIME TAKING CARE OF THIS PATIENT: 37 minutes.   POSSIBLE D/C IN 1-2 DAYS, DEPENDING ON CLINICAL CONDITION.   Enid Baasadhika Arayla Kruschke M.D on 12/14/2018 at 9:23 AM  Between 7am to 6pm - Pager - [phone]  After 6pm go to [email or web address] - password [email or web address]  [facility] [place] [facility]  Office  [phone]  CC: Primary care physician; Patient, No Pcp Per

## 2018-12-14 NOTE — Progress Notes (Signed)
Daily Progress Note   Subjective  - 1 Day Post-Op  Postop day #1 I&D right hallux.  No complaints  Objective Vitals:   12/13/18 0947 12/13/18 1151 12/13/18 2035 12/14/18 0432  BP: (!) 155/71 (!) 152/65 (!) 167/81 129/71  Pulse: 72 70 65 68  Resp: 16 18 16 20   Temp: 97.7 F (36.5 C) 98 F (36.7 C) 97.6 F (36.4 C) 98 F (36.7 C)  TempSrc: Oral Oral Oral Oral  SpO2: 90% 96% 94% 95%  Weight:      Height:        Physical Exam: Proximal distal incisions coapted.  Central wound with bloody drainage.  No purulence today.  Still erythema to the plantar great toe.  Dorsal right foot erythema has subsided.      Laboratory CBC    Component Value Date/Time   WBC 9.0 12/14/2018 0256   HGB 12.1 (L) 12/14/2018 0256   HCT 35.4 (L) 12/14/2018 0256   PLT 197 12/14/2018 0256    BMET    Component Value Date/Time   NA 133 (L) 12/14/2018 0256   K 4.5 12/14/2018 0256   CL 100 12/14/2018 0256   CO2 23 12/14/2018 0256   GLUCOSE 322 (H) 12/14/2018 0256   BUN 25 (H) 12/14/2018 0256   CREATININE 1.39 (H) 12/14/2018 0256   CALCIUM 8.7 (L) 12/14/2018 0256   GFRNONAA 55 (L) 12/14/2018 0256   GFRAA >60 12/14/2018 0256   Intraoperative wound culture: ABUNDANT WBC PRESENT,BOTH PMN AND MONONUCLEAR  FEW GRAM POSITIVE COCCI IN PAIRS  RARE GRAM VARIABLE ROD   Assessment/Planning: Abscess plantar right great toe questionable for osteomyelitis on MRI   Intraoperatively no obvious erosive changes of breakthrough the capsule of the interphalangeal joint of the great toe.  Makes osteomyelitis less likely but cannot completely rule out.  Will have to continue to monitor.  Purulence has diminished.  Awaiting intraoperative wound culture.  Outpatient wound culture was group B strep with mixed skin flora.  Currently on IV antibiotics.  Can plan to DC on p.o. Augmentin.  We will continue to closely monitor.  PT ordered for heel weightbearing.  Dressings: Flush wound daily with saline.  Cover with  dry bandage.  We will follow-up tomorrow for likely DC    Troy Morse  12/14/2018, 11:13 AM

## 2018-12-14 NOTE — Progress Notes (Signed)
PT Screen Note  Patient Details Name: Troy Morse MRN: [id number] DOB: Jun 24, 1960   Cancelled Treatment:    Reason Eval/Treat Not Completed: PT screened, no needs identified, will sign off Pt with good strength, mobility, safety and confidence with bed mobility and transfers.  He was able to keep weight fully off forefoot with confident sustained ankle DF and was able to do in-room ambulation w/o AD and maintain heel WBing only.  Pt had CAM walker in room and sandal at home, he called wife to send picture of the post-op shoe he current has at home.  Picture message reveals standard post-op shoe, not orthowedge/toe-off shoe.  Though pt was able to actively DF enough to keep weight off forefoot he agreed that having to actively do that too much may be difficult and this PT did recommend getting actual appropriate foot wear for home.  Nursing notified and will get ortho-wedge brought to room.  Pt in agreement about this plan and not needing HHPT to be able to return home safely.  Further formal PT intervention or equipment not needed at this time, will sign off.  Kreg Shropshire, DPT 12/14/2018, 1:21 PM

## 2018-12-15 LAB — GLUCOSE, CAPILLARY
Glucose-Capillary: 216 mg/dL — ABNORMAL HIGH (ref 70–99)
Glucose-Capillary: 221 mg/dL — ABNORMAL HIGH (ref 70–99)

## 2018-12-15 LAB — BASIC METABOLIC PANEL
Anion gap: 7 (ref 5–15)
BUN: 23 mg/dL — ABNORMAL HIGH (ref 6–20)
CO2: 28 mmol/L (ref 22–32)
Calcium: 8.9 mg/dL (ref 8.9–10.3)
Chloride: 103 mmol/L (ref 98–111)
Creatinine, Ser: 1.3 mg/dL — ABNORMAL HIGH (ref 0.61–1.24)
GFR calc Af Amer: >60 mL/min (ref >60)
GFR calc non Af Amer: >60 mL/min (ref >60)
Glucose, Bld: 192 mg/dL — ABNORMAL HIGH (ref 70–99)
Potassium: 4.7 mmol/L (ref 3.5–5.1)
Sodium: 138 mmol/L (ref 135–145)

## 2018-12-15 MED ORDER — AMOXICILLIN-POT CLAVULANATE 875-125 MG PO TABS: 1.0000 | ORAL_TABLET | Freq: Two times a day (BID) | ORAL | 0 refills | Status: AC

## 2018-12-15 MED ORDER — OXYCODONE HCL 5 MG PO TABS: 5.0000 mg | ORAL_TABLET | Freq: Four times a day (QID) | ORAL | 0 refills | Status: DC | PRN

## 2018-12-15 NOTE — Discharge Summary (Signed)
[employer] [facility] [place] [facility]   PATIENT NAME: Troy EatonKenneth Morse    MR#:  [id number]  DATE OF BIRTH:  11/15/1960  DATE OF ADMISSION:  12/11/2018   ADMITTING PHYSICIAN: Houston SirenVivek J Sainani, MD  DATE OF DISCHARGE: 12/15/18  PRIMARY CARE PHYSICIAN: Patient, No Pcp Per   ADMISSION DIAGNOSIS:   Wound infection [T14.8XXA, L08.9] Diabetic ulcer of toe of right foot associated with diabetes mellitus of other type, unspecified ulcer stage (HCC) [E95.284[E13.621, L97.519]  DISCHARGE DIAGNOSIS:   Active Problems:   Acute osteomyelitis of toe of right foot (HCC)   SECONDARY DIAGNOSIS:   Past Medical History:  Diagnosis Date  . Diabetes mellitus without complication (HCC)   . Hypertension     HOSPITAL COURSE:   58 year old male with past medical history significant for hypertension, diabetes, neuropathy presents to hospital secondary to right foot great toe ulceration without improvement on oral antibiotics.  1.  Right foot great toe osteomyelitis-x-rays have been reviewed, no osteomyelitis noted initially -MRI of the foot showing possible osteomyelitis with soft tissue edema.  Appreciate podiatry consult. -Status post wound debridement in the OR and deep wound cultures have been sent for. -No erosion noted on the bone during surgery.Marland Kitchen.  Appreciate ID input. -Outpatient cultures growing group B strep, Intra-Op cultures pending and growing gram-positive cocci for now. -Received Rocephin and vancomycin.    Discharged on Augmentin.  Continue to follow up the cultures for now. -Follow-up with podiatry within 1 week.  Daily dressing changes needed, will need washing with sterile saline and dry gauze and padding to the wound. -Minimal weightbearing on the heel of the right leg and nonweightbearing on the forefoot.  Has a postop wedge boot.  No further PT needs at home  2.  Hypertension-on lisinopril  3.  Neuropathy-on gabapentin  4.  Diabetes mellitus-continue home insulin and  metformin.  5.  CKD-stage III, baseline creatinine around 1.4.  Continue to monitor  Independent at baseline Stable for discharge today  DISCHARGE CONDITIONS:   Guarded  CONSULTS OBTAINED:   Treatment Team:  Linus Galasline, Todd, DPM Lynn Itoavishankar, Jayashree, MD  DRUG ALLERGIES:   Allergies  Allergen Reactions  . Empagliflozin Other (See Comments)    Pancreatitis   . Liraglutide Other (See Comments)    Pancreatitis    DISCHARGE MEDICATIONS:   Allergies as of 12/15/2018      Reactions   Empagliflozin Other (See Comments)   Pancreatitis   Liraglutide Other (See Comments)   Pancreatitis      Medication List    TAKE these medications   amoxicillin-clavulanate 875-125 MG tablet Commonly known as: Augmentin Take 1 tablet by mouth 2 (two) times daily for 14 days.   aspirin EC 81 MG tablet Take 81 mg by mouth at bedtime.   brimonidine 0.2 % ophthalmic solution Commonly known as: ALPHAGAN Place 1 drop into both eyes 2 (two) times a day.   gabapentin 300 MG capsule Commonly known as: NEURONTIN Take 300 mg by mouth every evening.   latanoprost 0.005 % ophthalmic solution Commonly known as: XALATAN Place 1 drop into both eyes at bedtime.   lisinopril 20 MG tablet Commonly known as: ZESTRIL Take 10 mg by mouth daily.   metFORMIN 500 MG tablet Commonly known as: GLUCOPHAGE Take 1,000 mg by mouth at bedtime.   NONFORMULARY OR COMPOUNDED ITEM Trimix (30/1/10)-(Pap/Phent/PGE)  Dosage: Inject 1ml per injection  Prefilled Syringe # Test Dose 3ml vial Vial 5ml   Qty 5ml Refills 3  [facility] [phone] Fax [phone]   NovoLOG FlexPen 100 UNIT/ML FlexPen Generic drug: insulin aspart Inject 0-14 Units into the skin See admin instructions. Inject under the skin 3 times daily at mealtimes according to sliding scale:  100-150: 0u 151-200: 5u 201-250: 8u 251-300: 10u 301-350: 12u >351: 14u   oxyCODONE 5 MG immediate release tablet Commonly  known as: Oxy IR/ROXICODONE Take 1-2 tablets (5-10 mg total) by mouth every 6 (six) hours as needed for moderate pain or severe pain.   pravastatin 80 MG tablet Commonly known as: PRAVACHOL Take 80 mg by mouth at bedtime.   sertraline 100 MG tablet Commonly known as: ZOLOFT Take 100 mg by mouth at bedtime.   Evaristo Buryresiba FlexTouch 200 UNIT/ML Sopn Generic drug: Insulin Degludec Inject 88-98 Units into the skin every evening.        DISCHARGE INSTRUCTIONS:   1.  Podiatry follow-up in 1 week 2.  PCP follow-up in 1 to 2 weeks  DIET:   Diabetic diet  ACTIVITY:   Activity as tolerated  OXYGEN:   Home Oxygen: No.  Oxygen Delivery: room air  DISCHARGE LOCATION:   home   If you experience worsening of your admission symptoms, develop shortness of breath, life threatening emergency, suicidal or homicidal thoughts you must seek medical attention immediately by calling 911 or calling your MD immediately  if symptoms less severe.  You Must read complete instructions/literature along with all the possible adverse reactions/side effects for all the Medicines you take and that have been prescribed to you. Take any new Medicines after you have completely understood and accpet all the possible adverse reactions/side effects.   Please note  You were cared for by a hospitalist during your hospital stay. If you have any questions about your discharge medications or the care you received while you were in the hospital after you are discharged, you can call the unit and asked to speak with the hospitalist on call if the hospitalist that took care of you is not available. Once you are discharged, your primary care physician will handle any further medical issues. Please note that NO REFILLS for any discharge medications will be authorized once you are discharged, as it is imperative that you return to your primary care physician (or establish a relationship with a primary care physician if you do  not have one) for your aftercare needs so that they can reassess your need for medications and monitor your lab values.    On the day of Discharge:  VITAL SIGNS:   Blood pressure (!) 179/73, pulse (!) 57, temperature 98.6 F (37 C), temperature source Oral, resp. rate 18, height 5\' 10"  (1.778 m), weight 95.3 kg, SpO2 97 %.  PHYSICAL EXAMINATION:    GENERAL:  58 y.o.-year-old patient lying in the bed with no acute distress.  EYES: Pupils equal, round, reactive to light and accommodation. No scleral icterus. Extraocular muscles intact.  HEENT: Head atraumatic, normocephalic. Oropharynx and nasopharynx clear.  NECK:  Supple, no jugular venous distention. No thyroid enlargement, no tenderness.  LUNGS: Normal breath sounds bilaterally, no wheezing, rales,rhonchi or crepitation. No use of accessory muscles of respiration.  CARDIOVASCULAR: S1, S2 normal. No murmurs, rubs, or gallops.  ABDOMEN: Soft, nontender, nondistended. Bowel sounds present. No organomegaly or mass.  EXTREMITIES: right foot first toe plantar wound. Dressing in place now No pedal edema, cyanosis, or clubbing.  NEUROLOGIC: Cranial nerves II through XII are intact. Muscle strength 5/5 in all extremities. Sensation intact. Gait not checked.  PSYCHIATRIC: The  patient is alert and oriented x 3.  SKIN: No obvious rash, lesion, or ulcer.          DATA REVIEW:   CBC Recent Labs  Lab 12/14/18 0256  WBC 9.0  HGB 12.1*  HCT 35.4*  PLT 197    Chemistries  Recent Labs  Lab 12/11/18 1402  12/15/18 0439  NA 137   < > 138  K 4.6   < > 4.7  CL 104   < > 103  CO2 22   < > 28  GLUCOSE 148*   < > 192*  BUN 30*   < > 23*  CREATININE 1.47*   < > 1.30*  CALCIUM 9.1   < > 8.9  AST 20  --   --   ALT 18  --   --   ALKPHOS 69  --   --   BILITOT 0.6  --   --    < > = values in this interval not displayed.     Microbiology Results  Results for orders placed or performed during the hospital encounter of 12/11/18   SARS Coronavirus 2 (CEPHEID- Performed in [place] hospital lab), Hosp Order     Status: None   Collection Time: 12/11/18  2:02 PM   Specimen: Nasopharyngeal Swab  Result Value Ref Range Status   SARS Coronavirus 2 NEGATIVE NEGATIVE Final    Comment: (NOTE) If result is NEGATIVE SARS-CoV-2 target nucleic acids are NOT DETECTED. The SARS-CoV-2 RNA is generally detectable in upper and lower  respiratory specimens during the acute phase of infection. The lowest  concentration of SARS-CoV-2 viral copies this assay can detect is 250  copies / mL. A negative result does not preclude SARS-CoV-2 infection  and should not be used as the sole basis for treatment or other  patient management decisions.  A negative result may occur with  improper specimen collection / handling, submission of specimen other  than nasopharyngeal swab, presence of viral mutation(s) within the  areas targeted by this assay, and inadequate number of viral copies  (<250 copies / mL). A negative result must be combined with clinical  observations, patient history, and epidemiological information. If result is POSITIVE SARS-CoV-2 target nucleic acids are DETECTED. The SARS-CoV-2 RNA is generally detectable in upper and lower  respiratory specimens dur ing the acute phase of infection.  Positive  results are indicative of active infection with SARS-CoV-2.  Clinical  correlation with patient history and other diagnostic information is  necessary to determine patient infection status.  Positive results do  not rule out bacterial infection or co-infection with other viruses. If result is PRESUMPTIVE POSTIVE SARS-CoV-2 nucleic acids MAY BE PRESENT.   A presumptive positive result was obtained on the submitted specimen  and confirmed on repeat testing.  While 2019 novel coronavirus  (SARS-CoV-2) nucleic acids may be present in the submitted sample  additional confirmatory testing may be necessary for epidemiological  and  / or clinical management purposes  to differentiate between  SARS-CoV-2 and other Sarbecovirus currently known to infect humans.  If clinically indicated additional testing with an alternate test  methodology [id number]) is advised. The SARS-CoV-2 RNA is generally  detectable in upper and lower respiratory sp ecimens during the acute  phase of infection. The expected result is Negative. Fact Sheet for Patients:  StrictlyIdeas.no Fact Sheet for Healthcare Providers: BankingDealers.co.za This test is not yet approved or cleared by the [place] FDA and has been authorized for detection and/or  diagnosis of SARS-CoV-2 by FDA under an Emergency Use Authorization (EUA).  This EUA will remain in effect (meaning this test can be used) for the duration of the COVID-19 declaration under Section 564(b)(1) of the Act, 21 U.S.C. section 360bbb-3(b)(1), unless the authorization is terminated or revoked sooner. Performed at [facility], [address] [place], [place] [address]   Aerobic/Anaerobic Culture (surgical/deep wound)     Status: None (Preliminary result)   Collection Time: 12/13/18  8:26 AM   Specimen: [facility] Other; Wound  Result Value Ref Range Status   Specimen Description   Final    ABSCESS Performed at [facility], [address] [place], [place] [address]    Special Requests   Final    NONE Performed at [facility], [address] [place], [place] [address]    Gram Stain   Final    ABUNDANT WBC PRESENT,BOTH PMN AND MONONUCLEAR FEW GRAM POSITIVE COCCI IN PAIRS RARE GRAM VARIABLE ROD    Culture   Final    CULTURE REINCUBATED FOR BETTER GROWTH Performed at [facility], [address] [place], [place] [address]    Report Status PENDING  Incomplete    RADIOLOGY:  No results found.   Management plans discussed with the patient, family and they are in agreement.  CODE STATUS:      Code Status Orders  (From admission, onward)         Start     Ordered   12/11/18 1709  Full code  Continuous     12/11/18 1709        Code Status History    This patient has a current code status but no historical code status.   Advance Care Planning Activity      TOTAL TIME TAKING CARE OF THIS PATIENT: 38  minutes.    Enid Baasadhika Soma Lizak M.D on 12/15/2018 at 10:05 AM  Between 7am to 6pm - Pager - [phone]  After 6pm go to [email or web address] - Social research officer, governmentpassword EPAS [facility]  [employer] [place] [facility]  Office  [phone]  CC: Primary care physician; Patient, No Pcp Per   Note: This dictation was prepared with Dragon dictation along with smaller phrase technology. Any transcriptional errors that result from this process are unintentional.

## 2018-12-15 NOTE — Progress Notes (Signed)
Patient cleared for discharge.      Education complete. AVS printed. Discharge instructions given. All questions answered for patient clarification.  Prescriptions given, pharmacy verified.  IV removed. Ortho wedge (foot) given.  Discharged to home via POV

## 2018-12-15 NOTE — Progress Notes (Signed)
Daily Progress Note   Subjective  - 2 Days Post-Op  Status post I&D right great toe.  Objective Vitals:   12/13/18 2035 12/14/18 0432 12/14/18 1207 12/14/18 2112  BP: (!) 167/81 129/71 136/73 (!) 179/73  Pulse: 65 68 60 (!) 57  Resp: 16 20 16 18   Temp: 97.6 F (36.4 C) 98 F (36.7 C) 97.8 F (36.6 C) 98.6 F (37 C)  TempSrc: Oral Oral Oral Oral  SpO2: 94% 95% 100% 97%  Weight:      Height:        Physical Exam: Erythema is stable.  No worsening erythema.  Diminished to the plantar first MTPJ.  Mild residual erythema surrounding the great toe.  Scant purulent drainage that upon flushing of the wound diminished extensively.  No lymphangitic streaking.  Wound culture is still pending.  Laboratory CBC    Component Value Date/Time   WBC 9.0 12/14/2018 0256   HGB 12.1 (L) 12/14/2018 0256   HCT 35.4 (L) 12/14/2018 0256   PLT 197 12/14/2018 0256    BMET    Component Value Date/Time   NA 138 12/15/2018 0439   K 4.7 12/15/2018 0439   CL 103 12/15/2018 0439   CO2 28 12/15/2018 0439   GLUCOSE 192 (H) 12/15/2018 0439   BUN 23 (H) 12/15/2018 0439   CREATININE 1.30 (H) 12/15/2018 0439   CALCIUM 8.9 12/15/2018 0439   GFRNONAA >60 12/15/2018 0439   GFRAA >60 12/15/2018 0439    Assessment/Planning: Abscess great toe questionable osteomyelitis right foot   At this point I believe he stable for discharge.  He can go home on oral antibiotics and we will closely monitor.  For wound care I discussed with the patient in detail.  This can be performed daily and even twice daily if needed.  Flush wound with saline wound wash.  Apply dry gauze and padded bandage.  Discussed with patient and he feels comfortable that he and his wife can perform this.  Saline wound wash can be purchased over-the-counter.  Dry gauze and bandaging can be purchased over-the-counter as well.  Patient should remain nonweightbearing with only minimal weightbearing to his heel.  OrthoWedge shoe has been  ordered.  Patient has flat postop shoe at home as well as equalizer walker boot.  Will see patient back in 1 week in outpatient clinic.  Patient can follow-up with me a week from Monday.  Patient is typically seen in [place].    Samara Deist A  12/15/2018, 9:47 AM

## 2018-12-18 LAB — AEROBIC/ANAEROBIC CULTURE W GRAM STAIN (SURGICAL/DEEP WOUND): Culture: NORMAL

## 2018-12-31 ENCOUNTER — Ambulatory Visit: Payer: BC Managed Care – PPO | Attending: Infectious Diseases | Admitting: Infectious Diseases

## 2018-12-31 ENCOUNTER — Other Ambulatory Visit
Admission: RE | Admit: 2018-12-31 | Discharge: 2018-12-31 | Disposition: A | Discharge status: Home / Self Care | Payer: BC Managed Care – PPO | Source: Ambulatory Visit | Attending: Infectious Diseases | Admitting: Infectious Diseases

## 2018-12-31 ENCOUNTER — Other Ambulatory Visit: Payer: Self-pay

## 2018-12-31 ENCOUNTER — Ambulatory Visit: Payer: BC Managed Care – PPO | Admitting: Infectious Diseases

## 2018-12-31 ENCOUNTER — Encounter: Payer: Self-pay | Admitting: Infectious Diseases

## 2018-12-31 VITALS — BP 130/85 | HR 85 | Temp 97.6°F | Ht 70.0 in | Wt 214.0 lb

## 2018-12-31 DIAGNOSIS — M869 Osteomyelitis, unspecified: Secondary | ICD-10-CM | POA: Diagnosis present

## 2018-12-31 DIAGNOSIS — L089 Local infection of the skin and subcutaneous tissue, unspecified: Secondary | ICD-10-CM

## 2018-12-31 DIAGNOSIS — E11621 Type 2 diabetes mellitus with foot ulcer: Secondary | ICD-10-CM

## 2018-12-31 DIAGNOSIS — E11628 Type 2 diabetes mellitus with other skin complications: Secondary | ICD-10-CM

## 2018-12-31 DIAGNOSIS — B951 Streptococcus, group B, as the cause of diseases classified elsewhere: Secondary | ICD-10-CM

## 2018-12-31 DIAGNOSIS — E1142 Type 2 diabetes mellitus with diabetic polyneuropathy: Secondary | ICD-10-CM

## 2018-12-31 DIAGNOSIS — Z888 Allergy status to other drugs, medicaments and biological substances status: Secondary | ICD-10-CM

## 2018-12-31 DIAGNOSIS — I10 Essential (primary) hypertension: Secondary | ICD-10-CM

## 2018-12-31 DIAGNOSIS — Z7984 Long term (current) use of oral hypoglycemic drugs: Secondary | ICD-10-CM

## 2018-12-31 DIAGNOSIS — Z79899 Other long term (current) drug therapy: Secondary | ICD-10-CM

## 2018-12-31 DIAGNOSIS — L97518 Non-pressure chronic ulcer of other part of right foot with other specified severity: Secondary | ICD-10-CM

## 2018-12-31 MED ORDER — DEXTROSE 5 % IV SOLN: 2.0000 g | Freq: Once | INTRAVENOUS | Status: DC

## 2018-12-31 NOTE — Progress Notes (Signed)
NAME: Troy Morse  DOB: 12/03/1960  MRN: [id number]  Date/Time: 12/31/2018 10:43 AM   Subjective:   ?Follow-up after hospital discharge. Troy Morse is a 58 y.o. male with a history of diabetes mellitus and hypertension, peripheral neuropathy was recently in [facility] between7/06/2018- 12/15/2018 for right great toe infection when he had I/Dof the abscess of the great toe and as the bone was intact he was sent home on oral Augmentin.  He followed up with Dr. Ether GriffinsFowler a week on 12/30/2018 and was noted to have l exposed tendon with some rupture and also capsule exposure.  X-ray showed a mild erosion of the interphalangeal joint.  He was recommended amputation of the great toe by Dr. Ether GriffinsFowler but patient wanted to try IV antibiotics and is been referred to me.  Patient otherwise is doing okay.  He does not have any fever or chills.  There is some swelling of the great toe.  Patient has had an ulcer on his right great toe for the past 3 months.  On 12/09/2018 he had gone to Dr. Irene LimboFowler's office with worsening wound and the culture was done at that time and that was only group B streptococcus.  He was given Cipro and doxy but he got admitted to the hospital within 48 hours. Past Medical History:  Diagnosis Date  . Diabetes mellitus without complication (HCC)   . Hypertension     Past Surgical History:  Procedure Laterality Date  . BACK SURGERY    . CHOLECYSTECTOMY    . HERNIA REPAIR    . INCISION AND DRAINAGE Right 12/13/2018   Procedure: INCISION AND DRAINAGE GREAT RIGHT TOE;  Surgeon: Gwyneth RevelsFowler, Justin, DPM;  Location: [facility] ORS;  Service: Podiatry;  Laterality: Right;    Social history Patient lives with his wife Has 3 dogs No alcohol no smoking Family History  Problem Relation Age of Onset  . COPD Mother   . Heart disease Father   . COPD Father    Allergies  Allergen Reactions  . Empagliflozin Other (See Comments)    Pancreatitis   . Liraglutide Other (See Comments)     Pancreatitis     ? Current Outpatient Medications  Medication Sig Dispense Refill  . aspirin EC 81 MG tablet Take 81 mg by mouth at bedtime.     . brimonidine (ALPHAGAN) 0.2 % ophthalmic solution Place 1 drop into both eyes 2 (two) times a day.    . gabapentin (NEURONTIN) 300 MG capsule Take 300 mg by mouth every evening.  2  . latanoprost (XALATAN) 0.005 % ophthalmic solution Place 1 drop into both eyes at bedtime.  2  . lisinopril (PRINIVIL,ZESTRIL) 20 MG tablet Take 10 mg by mouth daily.    . metFORMIN (GLUCOPHAGE) 500 MG tablet Take 1,000 mg by mouth at bedtime.     . NONFORMULARY OR COMPOUNDED ITEM Trimix (30/1/10)-(Pap/Phent/PGE)  Dosage: Inject 1ml per injection  Prefilled Syringe # Test Dose 3ml vial Vial 5ml   Qty 5ml Refills 3  [facility] [phone] Fax [phone] 5 each 3  . NOVOLOG FLEXPEN 100 UNIT/ML FlexPen Inject 0-14 Units into the skin See admin instructions. Inject under the skin 3 times daily at mealtimes according to sliding scale:  100-150: 0u 151-200: 5u 201-250: 8u 251-300: 10u 301-350: 12u >351: 14u  2  . oxyCODONE (OXY IR/ROXICODONE) 5 MG immediate release tablet Take 1-2 tablets (5-10 mg total) by mouth every 6 (six) hours as needed for moderate pain or severe  pain. 20 tablet 0  . pravastatin (PRAVACHOL) 80 MG tablet Take 80 mg by mouth at bedtime.     Tyler Aas FLEXTOUCH 200 UNIT/ML SOPN Inject 88-98 Units into the skin every evening.   11  . sertraline (ZOLOFT) 100 MG tablet Take 100 mg by mouth at bedtime.      No current facility-administered medications for this visit.      Abtx:  Anti-infectives (From admission, onward)   None      REVIEW OF SYSTEMS:  Const: negative fever, negative chills, negative weight loss Eyes: negative diplopia or visual changes, negative eye pain ENT: negative coryza, negative sore throat Resp: negative cough, hemoptysis, dyspnea Cards: negative for chest pain, palpitations, lower extremity  edema GU: negative for frequency, dysuria and hematuria GI: Negative for abdominal pain, diarrhea, bleeding, constipation Skin: negative for rash and pruritus Heme: negative for easy bruising and gum/nose bleeding MS: negative for myalgias, arthralgias, back pain and muscle weakness Neurolo:negative for headaches, dizziness, vertigo, memory problems  Psych: negative for feelings of anxiety, depression  Endocrine: negative for thyroid, diabetes Allergy/Immunology-as listed above ?  Objective:  VITALS:  BP 130/85 (BP Location: Left Arm, Patient Position: Sitting, Cuff Size: Normal)   Pulse 85   Temp 97.6 F (36.4 C) (Oral)   Ht 5\' 10"  (1.778 m)   Wt 214 lb (97.1 kg)   BMI 30.71 kg/m  PHYSICAL EXAM:  General: Alert, cooperative, no distress, appears stated age.  Head: Normocephalic, without obvious abnormality, atraumatic. Eyes: Conjunctivae clear, anicteric sclerae. Pupils are equal ENT Nares normal. No drainage or sinus tenderness. Lips, mucosa, and tongue normal. No Thrush Neck: Supple, symmetrical, no adenopathy, thyroid: non tender no carotid bruit and no JVD. Back: No CVA tenderness. Lungs: Clear to auscultation bilaterally. No Wheezing or Rhonchi. No rales. Heart: Regular rate and rhythm, no murmur, rub or gallop. Abdomen: Soft, non-tender,not distended. Bowel sounds normal. No masses Extremities: Right great toe swollen some erythema  Has an ulcer on the base of the toe it does not probe to bone.   Skin: No rashes or lesions. Or bruising Lymph: Cervical, supraclavicular normal. Neurologic: Grossly non-focal Pertinent Labs Lab Results CBC    Component Value Date/Time   WBC 9.0 12/14/2018 0256   RBC 3.87 (L) 12/14/2018 0256   HGB 12.1 (L) 12/14/2018 0256   HCT 35.4 (L) 12/14/2018 0256   PLT 197 12/14/2018 0256   MCV 91.5 12/14/2018 0256   MCH 31.3 12/14/2018 0256   MCHC 34.2 12/14/2018 0256   RDW 11.3 (L) 12/14/2018 0256   LYMPHSABS 1.2 12/11/2018 1402    MONOABS 0.9 12/11/2018 1402   EOSABS 0.1 12/11/2018 1402   BASOSABS 0.0 12/11/2018 1402    CMP Latest Ref Rng & Units 12/15/2018 12/14/2018 12/13/2018  Glucose 70 - 99 mg/dL 192(H) 322(H) 159(H)  BUN 6 - 20 mg/dL 23(H) 25(H) 30(H)  Creatinine 0.61 - 1.24 mg/dL 1.30(H) 1.39(H) 1.65(H)  Sodium 135 - 145 mmol/L 138 133(L) 137  Potassium 3.5 - 5.1 mmol/L 4.7 4.5 4.1  Chloride 98 - 111 mmol/L 103 100 106  CO2 22 - 32 mmol/L 28 23 25   Calcium 8.9 - 10.3 mg/dL 8.9 8.7(L) 8.9  Total Protein 6.5 - 8.1 g/dL - - -  Total Bilirubin 0.3 - 1.2 mg/dL - - -  Alkaline Phos 38 - 126 U/L - - -  AST 15 - 41 U/L - - -  ALT 0 - 44 U/L - - -      Microbiology: No  results found for this or any previous visit (from the past 240 hour(s)).   ? Impression/Recommendation ? ?58 year old male with history of diabetes mellitus has had a wound on his right great toe for the last 3 months. Group B streptococcus culture in the wound before.  Recent incision and drainage and has been on oral Augmentin since 12/15/2018.Marland Kitchen.  But now the wound has progressed and there is concern for early osteomyelitis as well as tendon rupture.  Dr. Ether GriffinsFowler has evaluated the patient and he had recommended amputation but the patient wants to try IV antibiotics first. Sent wound culture again today after cleaning the wound well.  We will get a PICC line.  We will start him on IV ceftriaxone and p.o. metronidazole 500 mg 3 times a day.  Diabetes mellitus management on metformin and Tresiba  On pravastatin  Peripheral neuropathy on gabapentin ? ___________________________________________________ Discussed with patient and his wife and Dr. Ether GriffinsFowler,  Note:  This document was prepared using Dragon voice recognition software and may include unintentional dictation errors.

## 2018-12-31 NOTE — Patient Instructions (Addendum)
You are here for the rt great toe infection- there is bone and tendon involvement. Your first culture from 6/29 had GBS. You have been on augmentin with no improvement- we will get a PICC line and give you IV antibiotic.Marland Kitchenwe will start  IV ceftriaxone once every day and oral metronidazole 500mg  every 8 hours I have repeated the culture today.need to keep the sugar under good control for the wound to heal. Also you have peripheral neuropathy and that impairs wound healing- please do not walk bare foot or soak your foot in water .

## 2019-01-01 ENCOUNTER — Other Ambulatory Visit: Payer: Self-pay | Admitting: Infectious Diseases

## 2019-01-01 ENCOUNTER — Ambulatory Visit
Admission: RE | Admit: 2019-01-01 | Discharge: 2019-01-01 | Disposition: A | Discharge status: Home / Self Care | Payer: BC Managed Care – PPO | Source: Ambulatory Visit | Attending: Infectious Diseases | Admitting: Infectious Diseases

## 2019-01-01 ENCOUNTER — Ambulatory Visit
Admission: RE | Admit: 2019-01-01 | Discharge: 2019-01-01 | Disposition: A | Discharge status: Home / Self Care | Payer: Self-pay | Source: Ambulatory Visit | Attending: Infectious Diseases | Admitting: Infectious Diseases

## 2019-01-01 DIAGNOSIS — Z7982 Long term (current) use of aspirin: Secondary | ICD-10-CM | POA: Insufficient documentation

## 2019-01-01 DIAGNOSIS — Z7984 Long term (current) use of oral hypoglycemic drugs: Secondary | ICD-10-CM | POA: Insufficient documentation

## 2019-01-01 DIAGNOSIS — Z79899 Other long term (current) drug therapy: Secondary | ICD-10-CM | POA: Insufficient documentation

## 2019-01-01 DIAGNOSIS — Z8249 Family history of ischemic heart disease and other diseases of the circulatory system: Secondary | ICD-10-CM | POA: Diagnosis not present

## 2019-01-01 DIAGNOSIS — M868X7 Other osteomyelitis, ankle and foot: Secondary | ICD-10-CM | POA: Insufficient documentation

## 2019-01-01 DIAGNOSIS — I10 Essential (primary) hypertension: Secondary | ICD-10-CM | POA: Diagnosis not present

## 2019-01-01 DIAGNOSIS — Z888 Allergy status to other drugs, medicaments and biological substances status: Secondary | ICD-10-CM | POA: Diagnosis not present

## 2019-01-01 DIAGNOSIS — Z9049 Acquired absence of other specified parts of digestive tract: Secondary | ICD-10-CM | POA: Diagnosis not present

## 2019-01-01 DIAGNOSIS — E119 Type 2 diabetes mellitus without complications: Secondary | ICD-10-CM | POA: Insufficient documentation

## 2019-01-01 MED ORDER — METRONIDAZOLE 500 MG PO TABS: 500.0000 mg | ORAL_TABLET | Freq: Three times a day (TID) | ORAL | 1 refills | Status: DC

## 2019-01-01 MED ORDER — HEPARIN SOD (PORK) LOCK FLUSH 100 UNIT/ML IV SOLN
INTRAVENOUS | Status: AC
  Administered 2019-01-01: 15:00:00 250 [IU]
  Filled 2019-01-01: qty 5

## 2019-01-01 MED ORDER — SODIUM CHLORIDE 0.9% FLUSH: 10.0000 mL | INTRAVENOUS | Status: DC | PRN

## 2019-01-01 MED ORDER — HEPARIN SOD (PORK) LOCK FLUSH 100 UNIT/ML IV SOLN
250.0000 [IU] | INTRAVENOUS | Status: AC | PRN
  Administered 2019-01-01: 15:00:00 250 [IU]

## 2019-01-01 MED ORDER — SODIUM CHLORIDE 0.9 % IV SOLN
2.0000 g | Freq: Once | INTRAVENOUS | Status: AC
  Administered 2019-01-01: 15:00:00 2 g via INTRAVENOUS
  Filled 2019-01-01: qty 2

## 2019-01-01 NOTE — Progress Notes (Signed)
Peripherally Inserted Central Catheter/Midline Placement  The IV Nurse has discussed with the patient and/or persons authorized to consent for the patient, the purpose of this procedure and the potential benefits and risks involved with this procedure.  The benefits include less needle sticks, lab draws from the catheter, and the patient may be discharged home with the catheter. Risks include, but not limited to, infection, bleeding, blood clot (thrombus formation), and puncture of an artery; nerve damage and irregular heartbeat and possibility to perform a PICC exchange if needed/ordered by physician.  Alternatives to this procedure were also discussed.  Bard Power PICC patient education guide, fact sheet on infection prevention and patient information card has been provided to patient /or left at bedside.    PICC/Midline Placement Documentation  PICC Single Lumen 35/36/14 PICC Right Basilic 42 cm 1 cm (Active)  Indication for Insertion or Continuance of Line Home intravenous therapies (PICC only) 01/01/19 1400  Exposed Catheter (cm) 1 cm 01/01/19 1400  Site Assessment Clean;Dry;Intact 01/01/19 1400  Line Status Flushed;Saline locked;Blood return noted 01/01/19 1400  Dressing Type Transparent;Securing device 01/01/19 1400  Dressing Status Clean;Dry;Intact;Antimicrobial disc in place 01/01/19 1400  Dressing Change Due 01/08/19 01/01/19 1400       Frances Maywood 01/01/2019, 2:05 PM

## 2019-01-01 NOTE — OR Nursing (Signed)
PICC line drew back blood easily. Rocephin antibiotic infused as ordered. Picc packed with 250 units heparin per policy. Pt. Tolerated well.

## 2019-01-01 NOTE — Discharge Instructions (Signed)
Patient is too follow up with Dr. Delaine Lame on August 6th, at 2 pm. Home health is suppose to come to your house tomorrow and deliver your medication and educate you on medications and PICC. If they do not come call your Dr's office immediately Tamika [phone].

## 2019-01-02 LAB — AEROBIC CULTURE W GRAM STAIN (SUPERFICIAL SPECIMEN): Culture: NORMAL

## 2019-01-07 ENCOUNTER — Other Ambulatory Visit
Admission: RE | Admit: 2019-01-07 | Discharge: 2019-01-07 | Disposition: A | Discharge status: Home / Self Care | Payer: BC Managed Care – PPO | Source: Ambulatory Visit | Attending: Infectious Diseases | Admitting: Infectious Diseases

## 2019-01-07 DIAGNOSIS — N183 Chronic kidney disease, stage 3 (moderate): Secondary | ICD-10-CM | POA: Insufficient documentation

## 2019-01-07 DIAGNOSIS — E11621 Type 2 diabetes mellitus with foot ulcer: Secondary | ICD-10-CM | POA: Diagnosis not present

## 2019-01-07 DIAGNOSIS — K2940 Chronic atrophic gastritis without bleeding: Secondary | ICD-10-CM | POA: Insufficient documentation

## 2019-01-07 DIAGNOSIS — K859 Acute pancreatitis without necrosis or infection, unspecified: Secondary | ICD-10-CM | POA: Diagnosis not present

## 2019-01-07 DIAGNOSIS — M86171 Other acute osteomyelitis, right ankle and foot: Secondary | ICD-10-CM | POA: Insufficient documentation

## 2019-01-07 DIAGNOSIS — R9439 Abnormal result of other cardiovascular function study: Secondary | ICD-10-CM | POA: Insufficient documentation

## 2019-01-07 DIAGNOSIS — H2513 Age-related nuclear cataract, bilateral: Secondary | ICD-10-CM | POA: Insufficient documentation

## 2019-01-07 LAB — CBC WITH DIFFERENTIAL/PLATELET
Abs Immature Granulocytes: 0.07 10*3/uL (ref 0.00–0.07)
Basophils Absolute: 0.1 10*3/uL (ref 0.0–0.1)
Basophils Relative: 1 %
Eosinophils Absolute: 0.1 10*3/uL (ref 0.0–0.5)
Eosinophils Relative: 1 %
HCT: 36.4 % — ABNORMAL LOW (ref 39.0–52.0)
Hemoglobin: 12.6 g/dL — ABNORMAL LOW (ref 13.0–17.0)
Immature Granulocytes: 1 %
Lymphocytes Relative: 19 %
Lymphs Abs: 1.7 10*3/uL (ref 0.7–4.0)
MCH: 31.5 pg (ref 26.0–34.0)
MCHC: 34.6 g/dL (ref 30.0–36.0)
MCV: 91 fL (ref 80.0–100.0)
Monocytes Absolute: 0.7 10*3/uL (ref 0.1–1.0)
Monocytes Relative: 9 %
Neutro Abs: 5.9 10*3/uL (ref 1.7–7.7)
Neutrophils Relative %: 69 %
Platelets: 187 10*3/uL (ref 150–400)
RBC: 4 MIL/uL — ABNORMAL LOW (ref 4.22–5.81)
RDW: 12.1 % (ref 11.5–15.5)
WBC: 8.5 10*3/uL (ref 4.0–10.5)
nRBC: 0 % (ref 0.0–0.2)

## 2019-01-07 LAB — COMPREHENSIVE METABOLIC PANEL
ALT: 20 U/L (ref 0–44)
AST: 21 U/L (ref 15–41)
Albumin: 3.9 g/dL (ref 3.5–5.0)
Alkaline Phosphatase: 63 U/L (ref 38–126)
Anion gap: 10 (ref 5–15)
BUN: 33 mg/dL — ABNORMAL HIGH (ref 6–20)
CO2: 23 mmol/L (ref 22–32)
Calcium: 8.8 mg/dL — ABNORMAL LOW (ref 8.9–10.3)
Chloride: 99 mmol/L (ref 98–111)
Creatinine, Ser: 2.18 mg/dL — ABNORMAL HIGH (ref 0.61–1.24)
GFR calc Af Amer: 37 mL/min — ABNORMAL LOW (ref >60)
GFR calc non Af Amer: 32 mL/min — ABNORMAL LOW (ref >60)
Glucose, Bld: 379 mg/dL — ABNORMAL HIGH (ref 70–99)
Potassium: 4.7 mmol/L (ref 3.5–5.1)
Sodium: 132 mmol/L — ABNORMAL LOW (ref 135–145)
Total Bilirubin: 0.5 mg/dL (ref 0.3–1.2)
Total Protein: 6.7 g/dL (ref 6.5–8.1)

## 2019-01-07 LAB — SEDIMENTATION RATE: Sed Rate: 47 mm/hr — ABNORMAL HIGH (ref 0–20)

## 2019-01-07 LAB — C-REACTIVE PROTEIN: CRP: 1.4 mg/dL — ABNORMAL HIGH (ref <1.0)

## 2019-01-08 ENCOUNTER — Telehealth: Payer: Self-pay | Admitting: Licensed Clinical Social Worker

## 2019-01-08 NOTE — Telephone Encounter (Signed)
Per Dr. Delaine Lame, the patient's creatinine was elevated and glucose was elevated, Patient informed to drink plenty of water and to avoid sodas. Do not take advil, ibuprofen, or aleve. Patient agreed with this plan. Advised patient to call with any questions or concerns.

## 2019-01-12 ENCOUNTER — Other Ambulatory Visit
Admission: RE | Admit: 2019-01-12 | Discharge: 2019-01-12 | Disposition: A | Discharge status: Home / Self Care | Payer: BC Managed Care – PPO | Source: Ambulatory Visit | Attending: Infectious Diseases | Admitting: Infectious Diseases

## 2019-01-12 DIAGNOSIS — M86171 Other acute osteomyelitis, right ankle and foot: Secondary | ICD-10-CM | POA: Diagnosis not present

## 2019-01-12 DIAGNOSIS — L97509 Non-pressure chronic ulcer of other part of unspecified foot with unspecified severity: Secondary | ICD-10-CM | POA: Diagnosis not present

## 2019-01-12 DIAGNOSIS — Z888 Allergy status to other drugs, medicaments and biological substances status: Secondary | ICD-10-CM | POA: Insufficient documentation

## 2019-01-12 DIAGNOSIS — N183 Chronic kidney disease, stage 3 (moderate): Secondary | ICD-10-CM | POA: Insufficient documentation

## 2019-01-12 DIAGNOSIS — E11621 Type 2 diabetes mellitus with foot ulcer: Secondary | ICD-10-CM | POA: Diagnosis not present

## 2019-01-12 DIAGNOSIS — E1122 Type 2 diabetes mellitus with diabetic chronic kidney disease: Secondary | ICD-10-CM | POA: Diagnosis not present

## 2019-01-12 DIAGNOSIS — E1169 Type 2 diabetes mellitus with other specified complication: Secondary | ICD-10-CM | POA: Diagnosis present

## 2019-01-12 LAB — COMPREHENSIVE METABOLIC PANEL
ALT: 18 U/L (ref 0–44)
AST: 21 U/L (ref 15–41)
Albumin: 3.9 g/dL (ref 3.5–5.0)
Alkaline Phosphatase: 71 U/L (ref 38–126)
Anion gap: 8 (ref 5–15)
BUN: 34 mg/dL — ABNORMAL HIGH (ref 6–20)
CO2: 23 mmol/L (ref 22–32)
Calcium: 9 mg/dL (ref 8.9–10.3)
Chloride: 100 mmol/L (ref 98–111)
Creatinine, Ser: 1.99 mg/dL — ABNORMAL HIGH (ref 0.61–1.24)
GFR calc Af Amer: 42 mL/min — ABNORMAL LOW (ref >60)
GFR calc non Af Amer: 36 mL/min — ABNORMAL LOW (ref >60)
Glucose, Bld: 473 mg/dL — ABNORMAL HIGH (ref 70–99)
Potassium: 5 mmol/L (ref 3.5–5.1)
Sodium: 131 mmol/L — ABNORMAL LOW (ref 135–145)
Total Bilirubin: 0.6 mg/dL (ref 0.3–1.2)
Total Protein: 6.8 g/dL (ref 6.5–8.1)

## 2019-01-12 LAB — CBC WITH DIFFERENTIAL/PLATELET
Abs Immature Granulocytes: 0.03 10*3/uL (ref 0.00–0.07)
Basophils Absolute: 0.1 10*3/uL (ref 0.0–0.1)
Basophils Relative: 1 %
Eosinophils Absolute: 0.2 10*3/uL (ref 0.0–0.5)
Eosinophils Relative: 2 %
HCT: 38.9 % — ABNORMAL LOW (ref 39.0–52.0)
Hemoglobin: 13 g/dL (ref 13.0–17.0)
Immature Granulocytes: 0 %
Lymphocytes Relative: 18 %
Lymphs Abs: 1.3 10*3/uL (ref 0.7–4.0)
MCH: 31.3 pg (ref 26.0–34.0)
MCHC: 33.4 g/dL (ref 30.0–36.0)
MCV: 93.7 fL (ref 80.0–100.0)
Monocytes Absolute: 0.6 10*3/uL (ref 0.1–1.0)
Monocytes Relative: 9 %
Neutro Abs: 5 10*3/uL (ref 1.7–7.7)
Neutrophils Relative %: 70 %
Platelets: 188 10*3/uL (ref 150–400)
RBC: 4.15 MIL/uL — ABNORMAL LOW (ref 4.22–5.81)
RDW: 12.1 % (ref 11.5–15.5)
WBC: 7.1 10*3/uL (ref 4.0–10.5)
nRBC: 0 % (ref 0.0–0.2)

## 2019-01-12 LAB — C-REACTIVE PROTEIN: CRP: 0.9 mg/dL (ref <1.0)

## 2019-01-12 LAB — SEDIMENTATION RATE: Sed Rate: 43 mm/hr — ABNORMAL HIGH (ref 0–20)

## 2019-01-16 ENCOUNTER — Ambulatory Visit: Payer: BC Managed Care – PPO | Attending: Infectious Diseases | Admitting: Infectious Diseases

## 2019-01-16 ENCOUNTER — Other Ambulatory Visit: Payer: Self-pay

## 2019-01-16 ENCOUNTER — Encounter: Payer: Self-pay | Admitting: Infectious Diseases

## 2019-01-16 VITALS — BP 149/85 | HR 87 | Temp 97.9°F | Wt 224.0 lb

## 2019-01-16 DIAGNOSIS — E1122 Type 2 diabetes mellitus with diabetic chronic kidney disease: Secondary | ICD-10-CM

## 2019-01-16 DIAGNOSIS — L97519 Non-pressure chronic ulcer of other part of right foot with unspecified severity: Secondary | ICD-10-CM

## 2019-01-16 DIAGNOSIS — E118 Type 2 diabetes mellitus with unspecified complications: Secondary | ICD-10-CM

## 2019-01-16 DIAGNOSIS — E11621 Type 2 diabetes mellitus with foot ulcer: Secondary | ICD-10-CM | POA: Diagnosis not present

## 2019-01-16 DIAGNOSIS — B951 Streptococcus, group B, as the cause of diseases classified elsewhere: Secondary | ICD-10-CM

## 2019-01-16 DIAGNOSIS — E1142 Type 2 diabetes mellitus with diabetic polyneuropathy: Secondary | ICD-10-CM

## 2019-01-16 DIAGNOSIS — Z79899 Other long term (current) drug therapy: Secondary | ICD-10-CM

## 2019-01-16 DIAGNOSIS — L089 Local infection of the skin and subcutaneous tissue, unspecified: Secondary | ICD-10-CM | POA: Diagnosis not present

## 2019-01-16 DIAGNOSIS — E11628 Type 2 diabetes mellitus with other skin complications: Secondary | ICD-10-CM | POA: Diagnosis not present

## 2019-01-16 DIAGNOSIS — N189 Chronic kidney disease, unspecified: Secondary | ICD-10-CM

## 2019-01-16 DIAGNOSIS — I129 Hypertensive chronic kidney disease with stage 1 through stage 4 chronic kidney disease, or unspecified chronic kidney disease: Secondary | ICD-10-CM

## 2019-01-16 DIAGNOSIS — Z95828 Presence of other vascular implants and grafts: Secondary | ICD-10-CM

## 2019-01-16 DIAGNOSIS — Z7984 Long term (current) use of oral hypoglycemic drugs: Secondary | ICD-10-CM

## 2019-01-16 DIAGNOSIS — Z792 Long term (current) use of antibiotics: Secondary | ICD-10-CM

## 2019-01-16 DIAGNOSIS — Z888 Allergy status to other drugs, medicaments and biological substances status: Secondary | ICD-10-CM

## 2019-01-16 MED ORDER — METRONIDAZOLE 500 MG PO TABS: 500.0000 mg | ORAL_TABLET | Freq: Two times a day (BID) | ORAL | 0 refills | Status: DC

## 2019-01-16 NOTE — Progress Notes (Signed)
NAME: Troy Morse  DOB: 11/13/1960  MRN: [id number]  Date/Time: 01/16/2019 10:14 AM  REQUESTING PROVIDER Subjective:   Follow up visit?for a right great toe infection.  He has been on IV ceftriaxone since 01/01/2019 and p.o. metronidazole.  Plan is to give him 4 weeks of antibiotics. The toe is intermittently swollen.  He says until yesterday it was fine and today this morning it is swollen.  There is no discharge from the wound. His blood sugar is not under control.  Today it was 400 he says his creatinine is also going up with the blood sugar.  His wife was on the phone and she said he has changed his diet a lot.  He is in between medical providers.  Both his PCP and endocrinologist left.  He is currently on Tresiba and metformin.  He has an appointment with a new PCP in August and then from there he would get an appointment with endocrinologist to go on insulin pump.  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  Whenever he is sitting he puts his foot up he says.  He has been 100% adherent to his IV and oral medication.  He took Flagyl last night and has no further medication.   Medical history He has diabetes mellitus  peripheral neuropathy and hypertension..Patient has had an ulcer on his right great toe for the past 4 months.  On 12/09/2018 he had gone to Dr. Alvera Singh office with worsening wound and the culture was done at that time and that was only group B streptococcus.  He was given Cipro and doxy but he got admitted to the hospital within 48 hours.was in [facility] between 12/11/2018- 12/15/2018  he had I/Dof the abscess of the great toe and as the bone was intact he was sent home on oral Augmentin.  He followed up with Dr. Vickki Muff and on 12/30/2018 and was noted to have  exposed tendon with some rupture and also capsule exposure.  X-ray showed a mild erosion of the interphalangeal joint.  He was recommended amputation of the great toe  but he wanted to try IV  antibiotics and he saw me on 12/31/2018 and started on IV ceftriaxone and oral Flagyl..  .    Had taken a culture on 12/31/2018 before starting IV antibiotic and that was negative Past Medical History:  Diagnosis Date   Diabetes mellitus without complication ([place])    Hypertension   Peripheral neuropathy Past Surgical History:  Procedure Laterality Date   BACK SURGERY     CHOLECYSTECTOMY     HERNIA REPAIR     INCISION AND DRAINAGE Right 12/13/2018   Procedure: INCISION AND DRAINAGE GREAT RIGHT TOE;  Surgeon: Samara Deist, DPM;  Location: [facility] ORS;  Service: Podiatry;  Laterality: Right;  Social history Lives with his wife No alcohol or smoking He has 3 dogs Family History  Problem Relation Age of Onset   COPD Mother    Heart disease Father    COPD Father    Allergies  Allergen Reactions   Empagliflozin Other (See Comments)    Pancreatitis    Liraglutide Other (See Comments)    Pancreatitis      ? Current Outpatient Medications  Medication Sig Dispense Refill   aspirin EC 81 MG tablet Take 81 mg by mouth at bedtime.      brimonidine (ALPHAGAN) 0.2 % ophthalmic solution Place 1 drop into both eyes 2 (two) times a day.  gabapentin (NEURONTIN) 300 MG capsule Take 300 mg by mouth every evening.  2   latanoprost (XALATAN) 0.005 % ophthalmic solution Place 1 drop into both eyes at bedtime.  2   lisinopril (PRINIVIL,ZESTRIL) 20 MG tablet Take 10 mg by mouth daily.     metFORMIN (GLUCOPHAGE) 500 MG tablet Take 1,000 mg by mouth at bedtime.      metroNIDAZOLE (FLAGYL) 500 MG tablet Take 1 tablet (500 mg total) by mouth 3 (three) times daily. 42 tablet 1   NONFORMULARY OR COMPOUNDED ITEM Trimix (30/1/10)-(Pap/Phent/PGE)  Dosage: Inject 47m per injection  Prefilled Syringe # Test Dose 3548mvial Vial 48m29m Qty 48ml81mfills 3  [phone] 3   NOVOLOG FLEXPEN 100 UNIT/ML FlexPen Inject 0-14 Units into the skin  See admin instructions. Inject under the skin 3 times daily at mealtimes according to sliding scale:  100-150: 0u 151-200: 5u 201-250: 8u 251-300: 10u 301-350: 12u >351: 14u  2   oxyCODONE (OXY IR/ROXICODONE) 5 MG immediate release tablet Take 1-2 tablets (5-10 mg total) by mouth every 6 (six) hours as needed for moderate pain or severe pain. 20 tablet 0   pravastatin (PRAVACHOL) 80 MG tablet Take 80 mg by mouth at bedtime.      sertraline (ZOLOFT) 100 MG tablet Take 100 mg by mouth at bedtime.      TRESIBA FLEXTOUCH 200 UNIT/ML SOPN Inject 88-98 Units into the skin every evening.   11   Current Facility-Administered Medications  Medication Dose Route Frequency Provider Last Rate Last Dose   cefTRIAXone (ROCEPHIN) 2 g in dextrose 5 % 50 mL IVPB  2 g Intravenous Once RaviTsosie Billing         Abtx:  Anti-infectives (From admission, onward)   None      REVIEW OF SYSTEMS:  Const: negative fever, negative chills, negative weight loss Eyes: negative diplopia or visual changes, negative eye pain ENT: negative coryza, negative sore throat Resp: negative cough, hemoptysis, dyspnea Cards: negative for chest pain, palpitations, lower extremity edema GU: negative for frequency, dysuria and hematuria GI: Negative for abdominal pain, diarrhea, bleeding, constipation Skin: negative for rash and pruritus Heme: negative for easy bruising and gum/nose bleeding MS: negative for myalgias, arthralgias, back pain and muscle weakness Neurolo:negative for headaches, dizziness, vertigo, memory problems  Psych: negative for feelings of anxiety, depression  Endocrine: Poorly controlled blood sugar  allergy/Immunology-as above Objective:  VITALS:  BP (!) 149/85 (BP Location: Left Arm, Patient Position: Sitting, Cuff Size: Normal)    Pulse 87    Temp 97.9 F (36.6 C) (Oral)    Wt 224 lb (101.6 kg)    BMI 32.14 kg/m  PHYSICAL EXAM:  General: Alert, cooperative, no distress, appears  stated age.  Head: Normocephalic, without obvious abnormality, atraumatic. Eyes: Conjunctivae clear, anicteric sclerae. Pupils are equal ENT Nares normal. No drainage or sinus tenderness. Lips, mucosa, and tongue normal. No Thrush Neck: Supple, symmetrical, no adenopathy, thyroid: non tender no carotid bruit and no JVD. Back: No CVA tenderness. Lungs: Clear to auscultation bilaterally. No Wheezing or Rhonchi. No rales. Heart: Regular rate and rhythm, no murmur, rub or gallop. Abdomen: Soft, non-tender,not distended. Bowel sounds normal. No masses Extremities: Right great toe slightly swollen, mild warmth, mild erythema, the wound on the plantar aspect is very dry and there is no discharge     Right PIC site entry point as little erythema but no purulent discharge.  No Biopatch there Skin: No  rashes or lesions. Or bruising Lymph: Cervical, supraclavicular normal. Neurologic: Grossly non-focal Pertinent Labs Lab Results CBC    Component Value Date/Time   WBC 7.1 01/12/2019 1100   RBC 4.15 (L) 01/12/2019 1100   HGB 13.0 01/12/2019 1100   HCT 38.9 (L) 01/12/2019 1100   PLT 188 01/12/2019 1100   MCV 93.7 01/12/2019 1100   MCH 31.3 01/12/2019 1100   MCHC 33.4 01/12/2019 1100   RDW 12.1 01/12/2019 1100   LYMPHSABS 1.3 01/12/2019 1100   MONOABS 0.6 01/12/2019 1100   EOSABS 0.2 01/12/2019 1100   BASOSABS 0.1 01/12/2019 1100    CMP Latest Ref Rng & Units 01/12/2019 01/07/2019 12/15/2018  Glucose 70 - 99 mg/dL 473(H) 379(H) 192(H)  BUN 6 - 20 mg/dL 34(H) 33(H) 23(H)  Creatinine 0.61 - 1.24 mg/dL 1.99(H) 2.18(H) 1.30(H)  Sodium 135 - 145 mmol/L 131(L) 132(L) 138  Potassium 3.5 - 5.1 mmol/L 5.0 4.7 4.7  Chloride 98 - 111 mmol/L 100 99 103  CO2 22 - 32 mmol/L 23 23 28   Calcium 8.9 - 10.3 mg/dL 9.0 8.8(L) 8.9  Total Protein 6.5 - 8.1 g/dL 6.8 6.7 -  Total Bilirubin 0.3 - 1.2 mg/dL 0.6 0.5 -  Alkaline Phos 38 - 126 U/L 71 63 -  AST 15 - 41 U/L 21 21 -  ALT 0 - 44 U/L 18 20 -   CRP  0.9 on 01/12/2019 ESR 43  Microbiology: 12/31/2018 wound culture no growth.  IMAGING RESULTS: I have personally reviewed the films ? Impression/Recommendation  ?58 year old male with history of diabetes mellitus has had a wound on his right great toe for the last 3 months. Group B streptococcus culture in the wound before.  Recent incision and drainage and has been on oral Augmentin since 12/15/2018.Marland Kitchen  But now the wound has progressed and there is concern for early osteomyelitis as well as tendon rupture.  So he was not on IV ceftriaxone and p.o. Flagyl from 01/01/2019 The wound is not healing as we want it to.  This could be because of his poorly controlled diabetes mellitus.  He is on [place] and metformin.  He has to see a new endocrinologist and a new PCP Continue IV ceftriaxone and p.o. Flagyl.for 2 more weeks.  we will reduce the Flagyl to twice daily .  Diabetes mellitus management on metformin and Tresiba-poorly controlled with sugars ranging in the 400s.  CKD : Has an appointment with new PCP on August 10.  Peripheral neuropathy on gabapentin  Follow-up with me if needed after he sees Dr. Vickki Muff.    Spoke to his visiting nurse and asked her to put a biopsy patch on the PICC line site.  ? ___________________________________________________ Discussed with patient, and his wife in great detail Note:  This document was prepared using Dragon voice recognition software and may include unintentional dictation errors.

## 2019-01-20 ENCOUNTER — Encounter: Payer: Self-pay | Admitting: Licensed Clinical Social Worker

## 2019-01-25 ENCOUNTER — Other Ambulatory Visit
Admission: RE | Admit: 2019-01-25 | Discharge: 2019-01-25 | Disposition: A | Discharge status: Home / Self Care | Payer: BC Managed Care – PPO | Source: Ambulatory Visit | Attending: Infectious Diseases | Admitting: Infectious Diseases

## 2019-01-25 DIAGNOSIS — N183 Chronic kidney disease, stage 3 (moderate): Secondary | ICD-10-CM | POA: Diagnosis not present

## 2019-01-25 DIAGNOSIS — E11621 Type 2 diabetes mellitus with foot ulcer: Secondary | ICD-10-CM | POA: Diagnosis not present

## 2019-01-25 DIAGNOSIS — M86171 Other acute osteomyelitis, right ankle and foot: Secondary | ICD-10-CM | POA: Diagnosis present

## 2019-01-25 LAB — COMPREHENSIVE METABOLIC PANEL
ALT: 17 U/L (ref 0–44)
AST: 24 U/L (ref 15–41)
Albumin: 3.6 g/dL (ref 3.5–5.0)
Alkaline Phosphatase: 53 U/L (ref 38–126)
Anion gap: 10 (ref 5–15)
BUN: 35 mg/dL — ABNORMAL HIGH (ref 6–20)
CO2: 20 mmol/L — ABNORMAL LOW (ref 22–32)
Calcium: 8.7 mg/dL — ABNORMAL LOW (ref 8.9–10.3)
Chloride: 103 mmol/L (ref 98–111)
Creatinine, Ser: 1.9 mg/dL — ABNORMAL HIGH (ref 0.61–1.24)
GFR calc Af Amer: 44 mL/min — ABNORMAL LOW (ref >60)
GFR calc non Af Amer: 38 mL/min — ABNORMAL LOW (ref >60)
Glucose, Bld: 446 mg/dL — ABNORMAL HIGH (ref 70–99)
Potassium: 5.5 mmol/L — ABNORMAL HIGH (ref 3.5–5.1)
Sodium: 133 mmol/L — ABNORMAL LOW (ref 135–145)
Total Bilirubin: 0.7 mg/dL (ref 0.3–1.2)
Total Protein: 6.7 g/dL (ref 6.5–8.1)

## 2019-01-25 LAB — CBC
HCT: 38.3 % — ABNORMAL LOW (ref 39.0–52.0)
Hemoglobin: 12.6 g/dL — ABNORMAL LOW (ref 13.0–17.0)
MCH: 31.1 pg (ref 26.0–34.0)
MCHC: 32.9 g/dL (ref 30.0–36.0)
MCV: 94.6 fL (ref 80.0–100.0)
Platelets: 196 10*3/uL (ref 150–400)
RBC: 4.05 MIL/uL — ABNORMAL LOW (ref 4.22–5.81)
RDW: 12 % (ref 11.5–15.5)
WBC: 5.7 10*3/uL (ref 4.0–10.5)
nRBC: 0 % (ref 0.0–0.2)

## 2019-01-25 LAB — SEDIMENTATION RATE: Sed Rate: 38 mm/hr — ABNORMAL HIGH (ref 0–20)

## 2019-01-25 LAB — C-REACTIVE PROTEIN: CRP: 1.8 mg/dL — ABNORMAL HIGH (ref <1.0)

## 2019-01-29 ENCOUNTER — Other Ambulatory Visit: Payer: Self-pay | Admitting: Infectious Diseases

## 2019-01-30 ENCOUNTER — Ambulatory Visit: Payer: BLUE CROSS/BLUE SHIELD | Admitting: Urology

## 2019-01-31 ENCOUNTER — Other Ambulatory Visit: Payer: Self-pay | Admitting: Infectious Diseases

## 2019-01-31 MED ORDER — METRONIDAZOLE 500 MG PO TABS: 500.0000 mg | ORAL_TABLET | Freq: Two times a day (BID) | ORAL | 0 refills | Status: DC

## 2019-02-02 ENCOUNTER — Other Ambulatory Visit
Admission: RE | Admit: 2019-02-02 | Discharge: 2019-02-02 | Disposition: A | Discharge status: Home / Self Care | Payer: BC Managed Care – PPO | Source: Ambulatory Visit | Attending: Infectious Diseases | Admitting: Infectious Diseases

## 2019-02-02 DIAGNOSIS — M86171 Other acute osteomyelitis, right ankle and foot: Secondary | ICD-10-CM | POA: Insufficient documentation

## 2019-02-02 DIAGNOSIS — E1122 Type 2 diabetes mellitus with diabetic chronic kidney disease: Secondary | ICD-10-CM | POA: Insufficient documentation

## 2019-02-02 DIAGNOSIS — N183 Chronic kidney disease, stage 3 (moderate): Secondary | ICD-10-CM | POA: Insufficient documentation

## 2019-02-02 DIAGNOSIS — E11621 Type 2 diabetes mellitus with foot ulcer: Secondary | ICD-10-CM | POA: Insufficient documentation

## 2019-02-02 LAB — COMPREHENSIVE METABOLIC PANEL
ALT: 19 U/L (ref 0–44)
AST: 20 U/L (ref 15–41)
Albumin: 4.1 g/dL (ref 3.5–5.0)
Alkaline Phosphatase: 50 U/L (ref 38–126)
Anion gap: 11 (ref 5–15)
BUN: 38 mg/dL — ABNORMAL HIGH (ref 6–20)
CO2: 21 mmol/L — ABNORMAL LOW (ref 22–32)
Calcium: 9.4 mg/dL (ref 8.9–10.3)
Chloride: 100 mmol/L (ref 98–111)
Creatinine, Ser: 2 mg/dL — ABNORMAL HIGH (ref 0.61–1.24)
GFR calc Af Amer: 41 mL/min — ABNORMAL LOW (ref >60)
GFR calc non Af Amer: 36 mL/min — ABNORMAL LOW (ref >60)
Glucose, Bld: 302 mg/dL — ABNORMAL HIGH (ref 70–99)
Potassium: 5.3 mmol/L — ABNORMAL HIGH (ref 3.5–5.1)
Sodium: 132 mmol/L — ABNORMAL LOW (ref 135–145)
Total Bilirubin: 0.2 mg/dL — ABNORMAL LOW (ref 0.3–1.2)
Total Protein: 7.1 g/dL (ref 6.5–8.1)

## 2019-02-02 LAB — CBC WITH DIFFERENTIAL/PLATELET
Abs Immature Granulocytes: 0.03 10*3/uL (ref 0.00–0.07)
Basophils Absolute: 0 10*3/uL (ref 0.0–0.1)
Basophils Relative: 0 %
Eosinophils Absolute: 0.1 10*3/uL (ref 0.0–0.5)
Eosinophils Relative: 1 %
HCT: 38.3 % — ABNORMAL LOW (ref 39.0–52.0)
Hemoglobin: 12.9 g/dL — ABNORMAL LOW (ref 13.0–17.0)
Immature Granulocytes: 0 %
Lymphocytes Relative: 17 %
Lymphs Abs: 1.5 10*3/uL (ref 0.7–4.0)
MCH: 31.2 pg (ref 26.0–34.0)
MCHC: 33.7 g/dL (ref 30.0–36.0)
MCV: 92.5 fL (ref 80.0–100.0)
Monocytes Absolute: 0.7 10*3/uL (ref 0.1–1.0)
Monocytes Relative: 8 %
Neutro Abs: 6.2 10*3/uL (ref 1.7–7.7)
Neutrophils Relative %: 74 %
Platelets: 205 10*3/uL (ref 150–400)
RBC: 4.14 MIL/uL — ABNORMAL LOW (ref 4.22–5.81)
RDW: 12.1 % (ref 11.5–15.5)
WBC: 8.5 10*3/uL (ref 4.0–10.5)
nRBC: 0 % (ref 0.0–0.2)

## 2019-02-03 NOTE — Progress Notes (Signed)
Patient called.

## 2019-02-04 ENCOUNTER — Other Ambulatory Visit: Payer: Self-pay | Admitting: Infectious Diseases

## 2019-02-06 ENCOUNTER — Other Ambulatory Visit: Payer: Self-pay | Admitting: Infectious Diseases

## 2019-02-08 ENCOUNTER — Other Ambulatory Visit
Admission: RE | Admit: 2019-02-08 | Discharge: 2019-02-08 | Disposition: A | Discharge status: Home / Self Care | Payer: BC Managed Care – PPO | Source: Ambulatory Visit | Attending: Infectious Diseases | Admitting: Infectious Diseases

## 2019-02-08 DIAGNOSIS — M86171 Other acute osteomyelitis, right ankle and foot: Secondary | ICD-10-CM | POA: Diagnosis not present

## 2019-02-08 DIAGNOSIS — N183 Chronic kidney disease, stage 3 (moderate): Secondary | ICD-10-CM | POA: Diagnosis not present

## 2019-02-08 DIAGNOSIS — E1122 Type 2 diabetes mellitus with diabetic chronic kidney disease: Secondary | ICD-10-CM | POA: Diagnosis not present

## 2019-02-08 DIAGNOSIS — E11621 Type 2 diabetes mellitus with foot ulcer: Secondary | ICD-10-CM | POA: Diagnosis present

## 2019-02-08 LAB — CBC WITH DIFFERENTIAL/PLATELET
Abs Immature Granulocytes: 0.03 10*3/uL (ref 0.00–0.07)
Basophils Absolute: 0 10*3/uL (ref 0.0–0.1)
Basophils Relative: 1 %
Eosinophils Absolute: 0.1 10*3/uL (ref 0.0–0.5)
Eosinophils Relative: 1 %
HCT: 40.3 % (ref 39.0–52.0)
Hemoglobin: 13.5 g/dL (ref 13.0–17.0)
Immature Granulocytes: 1 %
Lymphocytes Relative: 20 %
Lymphs Abs: 1.2 10*3/uL (ref 0.7–4.0)
MCH: 31.2 pg (ref 26.0–34.0)
MCHC: 33.5 g/dL (ref 30.0–36.0)
MCV: 93.1 fL (ref 80.0–100.0)
Monocytes Absolute: 0.5 10*3/uL (ref 0.1–1.0)
Monocytes Relative: 8 %
Neutro Abs: 4.2 10*3/uL (ref 1.7–7.7)
Neutrophils Relative %: 69 %
Platelets: 191 10*3/uL (ref 150–400)
RBC: 4.33 MIL/uL (ref 4.22–5.81)
RDW: 12 % (ref 11.5–15.5)
WBC: 6 10*3/uL (ref 4.0–10.5)
nRBC: 0 % (ref 0.0–0.2)

## 2019-02-08 LAB — COMPREHENSIVE METABOLIC PANEL
ALT: 21 U/L (ref 0–44)
AST: 22 U/L (ref 15–41)
Albumin: 3.8 g/dL (ref 3.5–5.0)
Alkaline Phosphatase: 46 U/L (ref 38–126)
Anion gap: 10 (ref 5–15)
BUN: 30 mg/dL — ABNORMAL HIGH (ref 6–20)
CO2: 21 mmol/L — ABNORMAL LOW (ref 22–32)
Calcium: 9.1 mg/dL (ref 8.9–10.3)
Chloride: 108 mmol/L (ref 98–111)
Creatinine, Ser: 1.39 mg/dL — ABNORMAL HIGH (ref 0.61–1.24)
GFR calc Af Amer: >60 mL/min (ref >60)
GFR calc non Af Amer: 55 mL/min — ABNORMAL LOW (ref >60)
Glucose, Bld: 212 mg/dL — ABNORMAL HIGH (ref 70–99)
Potassium: 4.6 mmol/L (ref 3.5–5.1)
Sodium: 139 mmol/L (ref 135–145)
Total Bilirubin: 0.3 mg/dL (ref 0.3–1.2)
Total Protein: 6.6 g/dL (ref 6.5–8.1)

## 2019-02-08 LAB — C-REACTIVE PROTEIN: CRP: 1.2 mg/dL — ABNORMAL HIGH (ref <1.0)

## 2019-02-08 LAB — SEDIMENTATION RATE: Sed Rate: 25 mm/hr — ABNORMAL HIGH (ref 0–20)

## 2019-02-13 ENCOUNTER — Telehealth: Payer: Self-pay | Admitting: Licensed Clinical Social Worker

## 2019-02-13 NOTE — Telephone Encounter (Signed)
Patient is coming in on 02/20/2019 at 9:30am will extend IV antibiotics until then.

## 2019-02-13 NOTE — Telephone Encounter (Signed)
Patient's wife called that they saw the new ortho doctor and he is advising 3 more weeks of antibiotics. Patient is completely out and will need a refill called to [facility].

## 2019-02-14 NOTE — Telephone Encounter (Signed)
Thanks

## 2019-02-18 ENCOUNTER — Ambulatory Visit: Payer: BC Managed Care – PPO | Admitting: Infectious Diseases

## 2019-02-18 NOTE — Telephone Encounter (Signed)
Patient called stating that he had to go out of town this weekend to access [place] and pushed his appointment from this Thursday to next Tuesday. He wanted to know if we can call [facility] and extend the antibiotics.

## 2019-02-19 ENCOUNTER — Other Ambulatory Visit: Payer: Self-pay | Admitting: Infectious Diseases

## 2019-02-19 ENCOUNTER — Other Ambulatory Visit: Payer: Self-pay | Admitting: Licensed Clinical Social Worker

## 2019-02-19 DIAGNOSIS — L089 Local infection of the skin and subcutaneous tissue, unspecified: Secondary | ICD-10-CM

## 2019-02-19 MED ORDER — METRONIDAZOLE 500 MG PO TABS: 500.0000 mg | ORAL_TABLET | Freq: Two times a day (BID) | ORAL | 0 refills | Status: DC

## 2019-02-20 ENCOUNTER — Ambulatory Visit: Payer: BC Managed Care – PPO | Admitting: Infectious Diseases

## 2019-02-22 ENCOUNTER — Other Ambulatory Visit
Admission: RE | Admit: 2019-02-22 | Discharge: 2019-02-22 | Disposition: A | Discharge status: Home / Self Care | Payer: BC Managed Care – PPO | Source: Ambulatory Visit | Attending: Infectious Diseases | Admitting: Infectious Diseases

## 2019-02-22 DIAGNOSIS — E11621 Type 2 diabetes mellitus with foot ulcer: Secondary | ICD-10-CM | POA: Diagnosis not present

## 2019-02-22 DIAGNOSIS — N183 Chronic kidney disease, stage 3 (moderate): Secondary | ICD-10-CM | POA: Diagnosis not present

## 2019-02-22 DIAGNOSIS — M86171 Other acute osteomyelitis, right ankle and foot: Secondary | ICD-10-CM | POA: Insufficient documentation

## 2019-02-22 DIAGNOSIS — E1122 Type 2 diabetes mellitus with diabetic chronic kidney disease: Secondary | ICD-10-CM | POA: Diagnosis not present

## 2019-02-22 LAB — CBC WITH DIFFERENTIAL/PLATELET
Abs Immature Granulocytes: 0.04 10*3/uL (ref 0.00–0.07)
Basophils Absolute: 0 10*3/uL (ref 0.0–0.1)
Basophils Relative: 1 %
Eosinophils Absolute: 0.1 10*3/uL (ref 0.0–0.5)
Eosinophils Relative: 2 %
HCT: 41.7 % (ref 39.0–52.0)
Hemoglobin: 13.7 g/dL (ref 13.0–17.0)
Immature Granulocytes: 1 %
Lymphocytes Relative: 18 %
Lymphs Abs: 1.3 10*3/uL (ref 0.7–4.0)
MCH: 30.8 pg (ref 26.0–34.0)
MCHC: 32.9 g/dL (ref 30.0–36.0)
MCV: 93.7 fL (ref 80.0–100.0)
Monocytes Absolute: 0.7 10*3/uL (ref 0.1–1.0)
Monocytes Relative: 9 %
Neutro Abs: 5.1 10*3/uL (ref 1.7–7.7)
Neutrophils Relative %: 69 %
Platelets: 182 10*3/uL (ref 150–400)
RBC: 4.45 MIL/uL (ref 4.22–5.81)
RDW: 12.1 % (ref 11.5–15.5)
WBC: 7.3 10*3/uL (ref 4.0–10.5)
nRBC: 0 % (ref 0.0–0.2)

## 2019-02-22 LAB — COMPREHENSIVE METABOLIC PANEL
ALT: 20 U/L (ref 0–44)
AST: 21 U/L (ref 15–41)
Albumin: 3.8 g/dL (ref 3.5–5.0)
Alkaline Phosphatase: 48 U/L (ref 38–126)
Anion gap: 10 (ref 5–15)
BUN: 28 mg/dL — ABNORMAL HIGH (ref 6–20)
CO2: 23 mmol/L (ref 22–32)
Calcium: 9.3 mg/dL (ref 8.9–10.3)
Chloride: 104 mmol/L (ref 98–111)
Creatinine, Ser: 1.39 mg/dL — ABNORMAL HIGH (ref 0.61–1.24)
GFR calc Af Amer: >60 mL/min (ref >60)
GFR calc non Af Amer: 55 mL/min — ABNORMAL LOW (ref >60)
Glucose, Bld: 240 mg/dL — ABNORMAL HIGH (ref 70–99)
Potassium: 5 mmol/L (ref 3.5–5.1)
Sodium: 137 mmol/L (ref 135–145)
Total Bilirubin: 0.5 mg/dL (ref 0.3–1.2)
Total Protein: 6.6 g/dL (ref 6.5–8.1)

## 2019-02-25 ENCOUNTER — Ambulatory Visit: Payer: BC Managed Care – PPO | Attending: Infectious Diseases | Admitting: Infectious Diseases

## 2019-02-25 ENCOUNTER — Other Ambulatory Visit: Payer: Self-pay

## 2019-02-25 ENCOUNTER — Encounter: Payer: Self-pay | Admitting: Infectious Diseases

## 2019-02-25 VITALS — BP 131/82 | HR 92 | Temp 97.6°F | Wt 228.0 lb

## 2019-02-25 DIAGNOSIS — E11621 Type 2 diabetes mellitus with foot ulcer: Secondary | ICD-10-CM | POA: Diagnosis not present

## 2019-02-25 DIAGNOSIS — L97519 Non-pressure chronic ulcer of other part of right foot with unspecified severity: Secondary | ICD-10-CM

## 2019-02-25 DIAGNOSIS — B951 Streptococcus, group B, as the cause of diseases classified elsewhere: Secondary | ICD-10-CM

## 2019-02-25 DIAGNOSIS — Z79899 Other long term (current) drug therapy: Secondary | ICD-10-CM

## 2019-02-25 DIAGNOSIS — N189 Chronic kidney disease, unspecified: Secondary | ICD-10-CM

## 2019-02-25 DIAGNOSIS — Z888 Allergy status to other drugs, medicaments and biological substances status: Secondary | ICD-10-CM

## 2019-02-25 DIAGNOSIS — E1142 Type 2 diabetes mellitus with diabetic polyneuropathy: Secondary | ICD-10-CM

## 2019-02-25 DIAGNOSIS — L089 Local infection of the skin and subcutaneous tissue, unspecified: Secondary | ICD-10-CM

## 2019-02-25 DIAGNOSIS — E11628 Type 2 diabetes mellitus with other skin complications: Secondary | ICD-10-CM

## 2019-02-25 DIAGNOSIS — Z794 Long term (current) use of insulin: Secondary | ICD-10-CM

## 2019-02-25 DIAGNOSIS — E1122 Type 2 diabetes mellitus with diabetic chronic kidney disease: Secondary | ICD-10-CM

## 2019-02-25 NOTE — Progress Notes (Signed)
NAME: Troy Morse  DOB: Dec 29, 1960  MRN: [id number]  Date/Time: 02/25/2019 10:21 AM   Subjective:  Follow-up visit for right great toe infection. ? Troy Morse is a 58 y.o. male with a history of diabetes mellitus, CKD, peripheral neuropathy, hypertension has had a right great toe infection since July 2020.  He has been on IV ceftriaxone since 01/01/2019 and p.o. metronidazole.  The initial plan was to give him for 4 weeks but both patient and his wife wanted to IV to continue as they were planning to see a new wound specialist at [facility].  He saw Dr. Jonell Cluck on 02/13/2019 and is planning to repeat an x-ray of the foot next week when he has another appointment with them. I last saw him on 01/16/2019.  Since then he has seen a new endocrinologist Dr. Elisabeth Cara and is continuing metformin and Tresiba along with short-acting nsulin.Marland Kitchen  He has a libre Accu-Chek and is monitoring his sugar closely.  His blood sugars which had ranged in the 300s is now at in the low 200s.  He also has a new PCP. Patient has had an right great toe ulcer since February 2020.  In June he had seen Dr. Vickki Muff and a culture was done which was positive for group B streptococcus.  He was given Cipro and doxy and later got admitted to the hospital at [facility] between 12/11/2018 until 12/15/2018.  He had IND of the abscess of the great toe and as the bone was intact he was sent home on oral Augmentin.  He followed up with Dr. Vickki Muff as outpatient on 12/30/2018 and during that visit was noted to have exposed tendon with some rupture and also capsule exposure.  X-ray showed a mild erosion of the interphalangeal joint.  He was recommended amputation of great toe but he wanted to try IV antibiotics and he saw me on 12/31/2018 and started on IV ceftriaxone and oral Flagyl.  Before IV a culture was done and that was negative. Patient has been on IV for now for the last 7 weeks. He says his great toe is much better, less  swollen, and less erythematous. His peripheral neuropathy has been out of control and had gone to see a neurologist yesterday and was asked to increase the gabapentin.  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   Past Medical History:  Diagnosis Date  . Diabetes mellitus without complication ([place])   . Hypertension     Past Surgical History:  Procedure Laterality Date  . BACK SURGERY    . CHOLECYSTECTOMY    . HERNIA REPAIR    . INCISION AND DRAINAGE Right 12/13/2018   Procedure: INCISION AND DRAINAGE GREAT RIGHT TOE;  Surgeon: Samara Deist, DPM;  Location: [facility] ORS;  Service: Podiatry;  Laterality: Right;    Social History   Socioeconomic History  . Marital status: Married    Spouse name: Not on file  . Number of children: Not on file  . Years of education: Not on file  . Highest education level: Not on file  Occupational History  . Not on file  Social Needs  . Financial resource strain: Not on file  . Food insecurity    Worry: Not on file    Inability: Not on file  . Transportation needs    Medical: Not on file    Non-medical: Not on  file  Tobacco Use  . Smoking status: Never Smoker  . Smokeless tobacco: Never Used  Substance and Sexual Activity  . Alcohol use: Not Currently  . Drug use: Never  . Sexual activity: Not on file  Lifestyle  . Physical activity    Days per week: Not on file    Minutes per session: Not on file  . Stress: Not on file  Relationships  . Social Herbalist on phone: Not on file    Gets together: Not on file    Attends religious service: Not on file    Active member of club or organization: Not on file    Attends meetings of clubs or organizations: Not on file    Relationship status: Not on file  . Intimate partner violence    Fear of current or ex partner: Not on file    Emotionally abused: Not on file    Physically abused: Not on file     Forced sexual activity: Not on file  Other Topics Concern  . Not on file  Social History Narrative  . Not on file    Family History  Problem Relation Age of Onset  . COPD Mother   . Heart disease Father   . COPD Father    Allergies  Allergen Reactions  . Empagliflozin Other (See Comments)    Pancreatitis   . Liraglutide Other (See Comments)    Pancreatitis     ? Current Outpatient Medications  Medication Sig Dispense Refill  . aspirin EC 81 MG tablet Take 81 mg by mouth at bedtime.     . brimonidine (ALPHAGAN) 0.2 % ophthalmic solution Place 1 drop into both eyes 2 (two) times a day.    . gabapentin (NEURONTIN) 300 MG capsule Take 300 mg by mouth every evening.  2  . latanoprost (XALATAN) 0.005 % ophthalmic solution Place 1 drop into both eyes at bedtime.  2  . lisinopril (PRINIVIL,ZESTRIL) 20 MG tablet Take 10 mg by mouth daily.    . metFORMIN (GLUCOPHAGE) 500 MG tablet Take 1,000 mg by mouth at bedtime.     . metroNIDAZOLE (FLAGYL) 500 MG tablet Take 1 tablet (500 mg total) by mouth 2 (two) times daily. 30 tablet 0  . NONFORMULARY OR COMPOUNDED ITEM Trimix (30/1/10)-(Pap/Phent/PGE)  Dosage: Inject 52m per injection  Prefilled Syringe # Test Dose 321mvial Vial 30m530m Qty 30ml38mfills 3  [phone] 3  . NOVOLOG FLEXPEN 100 UNIT/ML FlexPen Inject 0-14 Units into the skin See admin instructions. Inject under the skin 3 times daily at mealtimes according to sliding scale:  100-150: 0u 151-200: 5u 201-250: 8u 251-300: 10u 301-350: 12u >351: 14u  2  . oxyCODONE (OXY IR/ROXICODONE) 5 MG immediate release tablet Take 1-2 tablets (5-10 mg total) by mouth every 6 (six) hours as needed for moderate pain or severe pain. 20 tablet 0  . pravastatin (PRAVACHOL) 80 MG tablet Take 80 mg by mouth at bedtime.     . sertraline (ZOLOFT) 100 MG tablet Take 100 mg by mouth at bedtime.     . TRTyler AasXTOUCH 200 UNIT/ML SOPN Inject 88-98  Units into the skin every evening.   11   Current Facility-Administered Medications  Medication Dose Route Frequency Provider Last Rate Last Dose  . cefTRIAXone (ROCEPHIN) 2 g in dextrose 5 % 50 mL IVPB  2 g Intravenous Once RaviTsosie Billing  Abtx:  Anti-infectives (From admission, onward)   None      REVIEW OF SYSTEMS:  Const: negative fever, negative chills, negative weight loss Eyes: negative diplopia or visual changes, negative eye pain ENT: negative coryza, negative sore throat Resp: negative cough, hemoptysis, dyspnea Cards: negative for chest pain, palpitations, lower extremity edema GU: negative for frequency, dysuria and hematuria GI: Negative for abdominal pain, diarrhea, bleeding, constipation Skin: negative for rash and pruritus Heme: negative for easy bruising and gum/nose bleeding MS: negative for myalgias, arthralgias, back pain and muscle weakness Neurolo: numbness, tingling in his feet Psych: negative for feelings of anxiety, depression  Endocrine: Diabetes better control now  allergy/Immunology- negative for any medication or food allergies ?  Objective:  VITALS:  BP 131/82 (BP Location: Left Arm, Patient Position: Sitting, Cuff Size: Normal)   Pulse 92   Temp 97.6 F (36.4 C) (Oral)   Wt 228 lb (103.4 kg)   BMI 32.71 kg/m  PHYSICAL EXAM:  General: Alert, cooperative, no distress, appears stated age.  Head: Normocephalic, without obvious abnormality, atraumatic. Eyes: Conjunctivae clear, anicteric sclerae. Pupils are equal ENT Nares normal. No drainage or sinus tenderness. Lips, mucosa, and tongue normal. No Thrush Neck: Supple, symmetrical, no adenopathy, thyroid: non tender no carotid bruit and no JVD. Back: No CVA tenderness. Lungs: Clear to auscultation bilaterally. No Wheezing or Rhonchi. No rales. Heart: Regular rate and rhythm, no murmur, rub or gallop. Abdomen: Soft, non-tender,not distended. Bowel sounds normal. No masses  Extremities: Right great toe less swollen, less erythematous, the wound is smaller on the plantar aspect of the toe.  It has not closed  completely it is now covered with callus and I can still see a small opening in the center     01/16/19   01/16/19     atraumatic, no cyanosis. No edema. No clubbing Skin: No rashes or lesions. Or bruising Lymph: Cervical, supraclavicular normal. Neurologic: Grossly non-focal Pertinent Labs Lab Results CBC    Component Value Date/Time   WBC 7.3 02/22/2019 1100   RBC 4.45 02/22/2019 1100   HGB 13.7 02/22/2019 1100   HCT 41.7 02/22/2019 1100   PLT 182 02/22/2019 1100   MCV 93.7 02/22/2019 1100   MCH 30.8 02/22/2019 1100   MCHC 32.9 02/22/2019 1100   RDW 12.1 02/22/2019 1100   LYMPHSABS 1.3 02/22/2019 1100   MONOABS 0.7 02/22/2019 1100   EOSABS 0.1 02/22/2019 1100   BASOSABS 0.0 02/22/2019 1100    CMP Latest Ref Rng & Units 02/22/2019 02/08/2019 02/02/2019  Glucose 70 - 99 mg/dL 240(H) 212(H) 302(H)  BUN 6 - 20 mg/dL 28(H) 30(H) 38(H)  Creatinine 0.61 - 1.24 mg/dL 1.39(H) 1.39(H) 2.00(H)  Sodium 135 - 145 mmol/L 137 139 132(L)  Potassium 3.5 - 5.1 mmol/L 5.0 4.6 5.3(H)  Chloride 98 - 111 mmol/L 104 108 100  CO2 22 - 32 mmol/L 23 21(L) 21(L)  Calcium 8.9 - 10.3 mg/dL 9.3 9.1 9.4  Total Protein 6.5 - 8.1 g/dL 6.6 6.6 7.1  Total Bilirubin 0.3 - 1.2 mg/dL 0.5 0.3 0.2(L)  Alkaline Phos 38 - 126 U/L 48 46 50  AST 15 - 41 U/L 21 22 20   ALT 0 - 44 U/L 20 21 19    ? Impression/Recommendation ?58 year old male with a history of diabetes mellitus has had a wound on his right great toe for the last 8 months. Group B streptococcus culture in the wound in June.  on IV ceftriaxone since 01/01/2019 along with p.o. Flagyl 500 mg p.o.  twice daily.  The plan initially was to do 2 weeks of p.o. Flagyl and 4 weeks of ceftriaxone.  He and his wife were insistent on prolonged IV antibiotic as they were waiting to see the new foot surgeon at the wound center in  [place] and they had an appointment on 02/13/2019.Marland Kitchen  He is now  week 7 of IV.  We will stop IV tomorrow and will get the PICC line out.  Will discuss with Dr. Lorraine Lax at the wound center tomorrow. Asked patient to stop Flagyl as that may aggravate his peripheral neuropathy. CRP from 02/08/2019 is 1.2 which is normal and ESR is 25  ?Diabetes mellitus currently on metformin and Tresiba.  Also getting short-acting insulin.  His sugar i is better.  It used to be on 403 100s now it is the 200s range.  Last hemoglobin A1c is 9.5 from 02/24/2019.  CKD likely from diabetic nephropathy.  Is better it used to be creatinine of 2 in July and now it is 1.39.  Peripheral neuropathy on gabapentin  ?Follow-up in 1 month. ___________________________________________________ Discussed with patient, and with his wife on phone.  Answered all the questions. More than 50% of the visit was spent on counseling about antibiotic use, side effects and sugar control which is necessary to heal the wound.  .  Note:  This document was prepared using Dragon voice recognition software and may include unintentional dictation errors.

## 2019-02-25 NOTE — Patient Instructions (Addendum)
You are here for follow up of rt great toe infeciton- you are completing 8 weeks of Iv antibiotic- will stop after I discuss with your doc at [facility]. Please do not take any more metronidazole as it can aggravate neuropathy. Also Blood sugar will have to be under good control for the wound toheal. The wound is looking much better now

## 2019-02-26 ENCOUNTER — Telehealth: Payer: Self-pay | Admitting: Licensed Clinical Social Worker

## 2019-02-26 NOTE — Telephone Encounter (Signed)
-----   Message from Tsosie Billing, MD sent at 02/26/2019  1:11 PM EDT ----- Please let patient know that I spoke to Dr.Kerzner at [facility] wound clinic . Plan is to stop all antibiotics, get the PICC out and he will follow him next week and decide on further management

## 2019-02-26 NOTE — Telephone Encounter (Signed)
Spoke to the patient and he has some reservations with letting his particular home health nurse remove his PICC. Explained to the patient that the RN is trained to remove PICC and is usually safe and quick to do. He said he needs to talk to his wife and he will call back. I already notified [facility] that antibiotics have ended and PICC can be removed.

## 2019-03-18 ENCOUNTER — Other Ambulatory Visit: Payer: Self-pay | Admitting: Podiatry

## 2019-03-19 ENCOUNTER — Other Ambulatory Visit: Payer: Self-pay

## 2019-03-19 ENCOUNTER — Other Ambulatory Visit
Admission: RE | Admit: 2019-03-19 | Discharge: 2019-03-19 | Disposition: A | Discharge status: Home / Self Care | Payer: BC Managed Care – PPO | Source: Ambulatory Visit | Attending: Podiatry | Admitting: Podiatry

## 2019-03-19 DIAGNOSIS — I4510 Unspecified right bundle-branch block: Secondary | ICD-10-CM | POA: Insufficient documentation

## 2019-03-19 DIAGNOSIS — Z01818 Encounter for other preprocedural examination: Secondary | ICD-10-CM | POA: Diagnosis present

## 2019-03-19 DIAGNOSIS — M86171 Other acute osteomyelitis, right ankle and foot: Secondary | ICD-10-CM | POA: Diagnosis not present

## 2019-03-19 DIAGNOSIS — I10 Essential (primary) hypertension: Secondary | ICD-10-CM | POA: Insufficient documentation

## 2019-03-19 DIAGNOSIS — E11621 Type 2 diabetes mellitus with foot ulcer: Secondary | ICD-10-CM | POA: Insufficient documentation

## 2019-03-19 DIAGNOSIS — L97509 Non-pressure chronic ulcer of other part of unspecified foot with unspecified severity: Secondary | ICD-10-CM | POA: Diagnosis not present

## 2019-03-19 DIAGNOSIS — Z20828 Contact with and (suspected) exposure to other viral communicable diseases: Secondary | ICD-10-CM | POA: Diagnosis not present

## 2019-03-19 HISTORY — DX: Chronic kidney disease, unspecified: N18.9

## 2019-03-19 HISTORY — DX: Gastro-esophageal reflux disease without esophagitis: K21.9

## 2019-03-19 LAB — BASIC METABOLIC PANEL
Anion gap: 9 (ref 5–15)
BUN: 27 mg/dL — ABNORMAL HIGH (ref 6–20)
CO2: 24 mmol/L (ref 22–32)
Calcium: 9.8 mg/dL (ref 8.9–10.3)
Chloride: 105 mmol/L (ref 98–111)
Creatinine, Ser: 1.47 mg/dL — ABNORMAL HIGH (ref 0.61–1.24)
GFR calc Af Amer: 60 mL/min — ABNORMAL LOW (ref >60)
GFR calc non Af Amer: 51 mL/min — ABNORMAL LOW (ref >60)
Glucose, Bld: 164 mg/dL — ABNORMAL HIGH (ref 70–99)
Potassium: 4.6 mmol/L (ref 3.5–5.1)
Sodium: 138 mmol/L (ref 135–145)

## 2019-03-19 LAB — CBC
HCT: 41.1 % (ref 39.0–52.0)
Hemoglobin: 13.9 g/dL (ref 13.0–17.0)
MCH: 31.1 pg (ref 26.0–34.0)
MCHC: 33.8 g/dL (ref 30.0–36.0)
MCV: 91.9 fL (ref 80.0–100.0)
Platelets: 205 10*3/uL (ref 150–400)
RBC: 4.47 MIL/uL (ref 4.22–5.81)
RDW: 11.9 % (ref 11.5–15.5)
WBC: 9.9 10*3/uL (ref 4.0–10.5)
nRBC: 0 % (ref 0.0–0.2)

## 2019-03-19 NOTE — Patient Instructions (Signed)
Your procedure is scheduled on: 03/21/2019 Fri Report to Same Day Surgery 2nd floor medical mall [facility] Entrance-take elevator on left to 2nd floor.  Check in with surgery information desk.) To find out your arrival time please call [phone] between 1PM - 3PM on 03/20/2019 Thurs  Remember: Instructions that are not followed completely may result in serious medical risk, up to and including death, or upon the discretion of your surgeon and anesthesiologist your surgery may need to be rescheduled.    _x___ 1. Do not eat food after midnight the night before your procedure. You may drink clear liquids up to 2 hours before you are scheduled to arrive at the hospital for your procedure.  Do not drink clear liquids within 2 hours of your scheduled arrival to the hospital.  Clear liquids include  --Water or Apple juice without pulp  --Clear carbohydrate beverage such as ClearFast or Gatorade  --Black Coffee or Clear Tea (No milk, no creamers, do not add anything to                  the coffee or Tea Type 1 and type 2 diabetics should only drink water.   ____Ensure clear carbohydrate drink on the way to the hospital for bariatric patients  ____Ensure clear carbohydrate drink 3 hours before surgery.   No gum chewing or hard candies.     __x__ 2. No Alcohol for 24 hours before or after surgery.   __x__3. No Smoking or e-cigarettes for 24 prior to surgery.  Do not use any chewable tobacco products for at least 6 hour prior to surgery   ____  4. Bring all medications with you on the day of surgery if instructed.    __x__ 5. Notify your doctor if there is any change in your medical condition     (cold, fever, infections).    x___6. On the morning of surgery brush your teeth with toothpaste and water.  You may rinse your mouth with mouth wash if you wish.  Do not swallow any toothpaste or mouthwash.   Do not wear jewelry, make-up, hairpins, clips or nail polish.  Do not wear lotions,  powders, or perfumes. You may wear deodorant.  Do not shave 48 hours prior to surgery. Men may shave face and neck.  Do not bring valuables to the hospital.    [facility] is not responsible for any belongings or valuables.               Contacts, dentures or bridgework may not be worn into surgery.  Leave your suitcase in the car. After surgery it may be brought to your room.  For patients admitted to the hospital, discharge time is determined by your                       treatment team.  _  Patients discharged the day of surgery will not be allowed to drive home.  You will need someone to drive you home and stay with you the night of your procedure.    Please read over the following fact sheets that you were given:   [facility] Preparing for Surgery and or MRSA Information   _x___ Take anti-hypertensive listed below, cardiac, seizure, asthma,     anti-reflux and psychiatric medicines. These include:  1.   2.  3.  4.  5.  6.  ____Fleets enema or Magnesium Citrate as directed.   _x___ Use CHG Soap or  sage wipes as directed on instruction sheet   ____ Use inhalers on the day of surgery and bring to hospital day of surgery  __x__ Stop Metformin and Janumet 2 days prior to surgery.    ____ Take 1/2 of usual insulin dose the night before surgery and none on the morning     surgery.   _x___ Follow recommendations from Cardiologist, Pulmonologist or PCP regarding          stopping Aspirin, Coumadin, Plavix ,Eliquis, Effient, or Pradaxa, and Pletal.  X____Stop Anti-inflammatories such as Advil, Aleve, Ibuprofen, Motrin, Naproxen, Naprosyn, Goodies powders or aspirin products. OK to take Tylenol and                          Celebrex.   _x___ Stop supplements until after surgery.  But may continue Vitamin D, Vitamin B,       and multivitamin.   ____ Bring C-Pap to the hospital.

## 2019-03-20 LAB — SARS CORONAVIRUS 2 (TAT 6-24 HRS): SARS Coronavirus 2: NEGATIVE

## 2019-03-20 MED ORDER — CEFAZOLIN SODIUM-DEXTROSE 2-4 GM/100ML-% IV SOLN
2.0000 g | INTRAVENOUS | Status: AC
  Administered 2019-03-21: 2 g via INTRAVENOUS

## 2019-03-21 ENCOUNTER — Ambulatory Visit
Admission: RE | Admit: 2019-03-21 | Discharge: 2019-03-21 | Disposition: A | Discharge status: Home / Self Care | Payer: BC Managed Care – PPO | Source: Ambulatory Visit | Attending: Podiatry | Admitting: Podiatry

## 2019-03-21 ENCOUNTER — Other Ambulatory Visit: Payer: Self-pay

## 2019-03-21 ENCOUNTER — Ambulatory Visit: Payer: BC Managed Care – PPO | Admitting: Certified Registered Nurse Anesthetist

## 2019-03-21 ENCOUNTER — Encounter: Payer: Self-pay | Admitting: *Deleted

## 2019-03-21 ENCOUNTER — Encounter
Admission: RE | Disposition: A | Discharge status: Home / Self Care | Payer: Self-pay | Source: Ambulatory Visit | Attending: Podiatry

## 2019-03-21 DIAGNOSIS — Z794 Long term (current) use of insulin: Secondary | ICD-10-CM | POA: Insufficient documentation

## 2019-03-21 DIAGNOSIS — E1122 Type 2 diabetes mellitus with diabetic chronic kidney disease: Secondary | ICD-10-CM | POA: Insufficient documentation

## 2019-03-21 DIAGNOSIS — E1169 Type 2 diabetes mellitus with other specified complication: Secondary | ICD-10-CM | POA: Diagnosis not present

## 2019-03-21 DIAGNOSIS — M86171 Other acute osteomyelitis, right ankle and foot: Secondary | ICD-10-CM | POA: Diagnosis not present

## 2019-03-21 DIAGNOSIS — Z888 Allergy status to other drugs, medicaments and biological substances status: Secondary | ICD-10-CM | POA: Insufficient documentation

## 2019-03-21 DIAGNOSIS — Z79899 Other long term (current) drug therapy: Secondary | ICD-10-CM | POA: Diagnosis not present

## 2019-03-21 DIAGNOSIS — L97509 Non-pressure chronic ulcer of other part of unspecified foot with unspecified severity: Secondary | ICD-10-CM | POA: Diagnosis not present

## 2019-03-21 DIAGNOSIS — I129 Hypertensive chronic kidney disease with stage 1 through stage 4 chronic kidney disease, or unspecified chronic kidney disease: Secondary | ICD-10-CM | POA: Insufficient documentation

## 2019-03-21 DIAGNOSIS — Z7982 Long term (current) use of aspirin: Secondary | ICD-10-CM | POA: Diagnosis not present

## 2019-03-21 DIAGNOSIS — E11621 Type 2 diabetes mellitus with foot ulcer: Secondary | ICD-10-CM | POA: Diagnosis not present

## 2019-03-21 DIAGNOSIS — E1142 Type 2 diabetes mellitus with diabetic polyneuropathy: Secondary | ICD-10-CM | POA: Insufficient documentation

## 2019-03-21 DIAGNOSIS — M869 Osteomyelitis, unspecified: Secondary | ICD-10-CM | POA: Diagnosis present

## 2019-03-21 DIAGNOSIS — N189 Chronic kidney disease, unspecified: Secondary | ICD-10-CM | POA: Insufficient documentation

## 2019-03-21 HISTORY — PX: BONE EXCISION: SHX6730

## 2019-03-21 LAB — GLUCOSE, CAPILLARY
Glucose-Capillary: 218 mg/dL — ABNORMAL HIGH (ref 70–99)
Glucose-Capillary: 194 mg/dL — ABNORMAL HIGH (ref 70–99)

## 2019-03-21 SURGERY — BONE EXCISION
Anesthesia: General | Laterality: Right

## 2019-03-21 MED ORDER — LIDOCAINE HCL (CARDIAC) PF 100 MG/5ML IV SOSY
PREFILLED_SYRINGE | INTRAVENOUS | Status: DC | PRN
  Administered 2019-03-21: 80 mg via INTRAVENOUS

## 2019-03-21 MED ORDER — HYDRALAZINE HCL 20 MG/ML IJ SOLN
INTRAMUSCULAR | Status: AC
  Administered 2019-03-21: 5 mg via INTRAVENOUS
  Filled 2019-03-21: qty 1

## 2019-03-21 MED ORDER — AMOXICILLIN-POT CLAVULANATE 875-125 MG PO TABS: 1.0000 | ORAL_TABLET | Freq: Two times a day (BID) | ORAL | 0 refills | Status: DC

## 2019-03-21 MED ORDER — PROPOFOL 10 MG/ML IV BOLUS
INTRAVENOUS | Status: DC | PRN
  Administered 2019-03-21: 200 mg via INTRAVENOUS

## 2019-03-21 MED ORDER — FENTANYL CITRATE (PF) 100 MCG/2ML IJ SOLN
INTRAMUSCULAR | Status: DC | PRN
  Administered 2019-03-21 (×2): 25 ug via INTRAVENOUS

## 2019-03-21 MED ORDER — THROMBIN 5000 UNITS EX SOLR
CUTANEOUS | Status: AC
  Filled 2019-03-21: qty 5000

## 2019-03-21 MED ORDER — CEFAZOLIN SODIUM-DEXTROSE 2-4 GM/100ML-% IV SOLN
INTRAVENOUS | Status: AC
  Filled 2019-03-21: qty 100

## 2019-03-21 MED ORDER — POVIDONE-IODINE 7.5 % EX SOLN
Freq: Once | CUTANEOUS | Status: DC
  Filled 2019-03-21: qty 118

## 2019-03-21 MED ORDER — HYDRALAZINE HCL 20 MG/ML IJ SOLN
5.0000 mg | Freq: Once | INTRAMUSCULAR | Status: AC
  Administered 2019-03-21: 15:00:00 5 mg via INTRAVENOUS

## 2019-03-21 MED ORDER — OXYCODONE-ACETAMINOPHEN 5-325 MG PO TABS: 1.0000 | ORAL_TABLET | Freq: Four times a day (QID) | ORAL | 0 refills | Status: AC | PRN

## 2019-03-21 MED ORDER — BUPIVACAINE HCL (PF) 0.5 % IJ SOLN
INTRAMUSCULAR | Status: AC
  Filled 2019-03-21: qty 30

## 2019-03-21 MED ORDER — BUPIVACAINE HCL 0.5 % IJ SOLN
INTRAMUSCULAR | Status: DC | PRN
  Administered 2019-03-21: 3 mL

## 2019-03-21 MED ORDER — ONDANSETRON HCL 4 MG/2ML IJ SOLN
INTRAMUSCULAR | Status: DC | PRN
  Administered 2019-03-21: 4 mg via INTRAVENOUS

## 2019-03-21 MED ORDER — LIDOCAINE HCL (PF) 2 % IJ SOLN
INTRAMUSCULAR | Status: AC
  Filled 2019-03-21: qty 10

## 2019-03-21 MED ORDER — ONDANSETRON HCL 4 MG/2ML IJ SOLN
INTRAMUSCULAR | Status: AC
  Filled 2019-03-21: qty 2

## 2019-03-21 MED ORDER — SODIUM CHLORIDE 0.9 % IV SOLN
INTRAVENOUS | Status: DC
  Administered 2019-03-21: 11:00:00 via INTRAVENOUS

## 2019-03-21 MED ORDER — MIDAZOLAM HCL 2 MG/2ML IJ SOLN
INTRAMUSCULAR | Status: AC
  Filled 2019-03-21: qty 2

## 2019-03-21 MED ORDER — DEXAMETHASONE SODIUM PHOSPHATE 10 MG/ML IJ SOLN
INTRAMUSCULAR | Status: AC
  Filled 2019-03-21: qty 1

## 2019-03-21 MED ORDER — FENTANYL CITRATE (PF) 100 MCG/2ML IJ SOLN
INTRAMUSCULAR | Status: AC
  Filled 2019-03-21: qty 2

## 2019-03-21 MED ORDER — MIDAZOLAM HCL 2 MG/2ML IJ SOLN
INTRAMUSCULAR | Status: DC | PRN
  Administered 2019-03-21: 2 mg via INTRAVENOUS

## 2019-03-21 MED ORDER — DEXAMETHASONE SODIUM PHOSPHATE 10 MG/ML IJ SOLN
INTRAMUSCULAR | Status: DC | PRN
  Administered 2019-03-21: 5 mg via INTRAVENOUS

## 2019-03-21 MED ORDER — LIDOCAINE HCL (PF) 1 % IJ SOLN
INTRAMUSCULAR | Status: DC | PRN
  Administered 2019-03-21: 3 mL

## 2019-03-21 MED ORDER — PROPOFOL 10 MG/ML IV BOLUS
INTRAVENOUS | Status: AC
  Filled 2019-03-21: qty 20

## 2019-03-21 MED ORDER — LIDOCAINE HCL (PF) 1 % IJ SOLN
INTRAMUSCULAR | Status: AC
  Filled 2019-03-21: qty 30

## 2019-03-21 SURGICAL SUPPLY — 49 items
BLADE OSC/SAGITTAL MD 5.5X18 (BLADE) ×2 IMPLANT
BLADE SURG MINI STRL (BLADE) ×2 IMPLANT
BNDG CONFORM 2 STRL LF (GAUZE/BANDAGES/DRESSINGS) ×2 IMPLANT
BNDG CONFORM 3 STRL LF (GAUZE/BANDAGES/DRESSINGS) ×4 IMPLANT
BNDG ELASTIC 4X5.8 VLCR NS LF (GAUZE/BANDAGES/DRESSINGS) ×2 IMPLANT
BNDG ESMARK 4X12 TAN STRL LF (GAUZE/BANDAGES/DRESSINGS) ×2 IMPLANT
BNDG GAUZE 4.5X4.1 6PLY STRL (MISCELLANEOUS) ×2 IMPLANT
CANISTER SUCT 1200ML W/VALVE (MISCELLANEOUS) ×2 IMPLANT
COVER WAND RF STERILE (DRAPES) ×2 IMPLANT
CUFF TOURN SGL QUICK 12 (TOURNIQUET CUFF) IMPLANT
CUFF TOURN SGL QUICK 18X4 (TOURNIQUET CUFF) ×2 IMPLANT
DRAPE FLUOR MINI C-ARM 54X84 (DRAPES) ×2 IMPLANT
DRAPE XRAY CASSETTE 23X24 (DRAPES) ×2 IMPLANT
DURAPREP 26ML APPLICATOR (WOUND CARE) ×2 IMPLANT
ELECT REM PT RETURN 9FT ADLT (ELECTROSURGICAL) ×2
ELECTRODE REM PT RTRN 9FT ADLT (ELECTROSURGICAL) ×1 IMPLANT
GAUZE 4X4 16PLY RFD (DISPOSABLE) ×2 IMPLANT
GAUZE PACKING IODOFORM 1/2 (PACKING) ×2 IMPLANT
GAUZE SPONGE 4X4 12PLY STRL (GAUZE/BANDAGES/DRESSINGS) ×2 IMPLANT
GAUZE XEROFORM 1X8 LF (GAUZE/BANDAGES/DRESSINGS) ×2 IMPLANT
GLOVE BIO SURGEON STRL SZ7.5 (GLOVE) ×2 IMPLANT
GLOVE INDICATOR 8.0 STRL GRN (GLOVE) ×2 IMPLANT
GOWN STRL REUS W/ TWL LRG LVL3 (GOWN DISPOSABLE) ×2 IMPLANT
GOWN STRL REUS W/TWL LRG LVL3 (GOWN DISPOSABLE) ×2
KIT TURNOVER KIT A (KITS) ×2 IMPLANT
LABEL OR SOLS (LABEL) ×2 IMPLANT
NEEDLE FILTER BLUNT 18X 1/2SAF (NEEDLE)
NEEDLE FILTER BLUNT 18X1 1/2 (NEEDLE) IMPLANT
NEEDLE HYPO 25X1 1.5 SAFETY (NEEDLE) ×2 IMPLANT
NS IRRIG 500ML POUR BTL (IV SOLUTION) ×2 IMPLANT
PACK EXTREMITY ARMC (MISCELLANEOUS) ×2 IMPLANT
PAD ABD DERMACEA PRESS 5X9 (GAUZE/BANDAGES/DRESSINGS) ×4 IMPLANT
PULSAVAC PLUS IRRIG FAN TIP (DISPOSABLE)
SHIELD FULL FACE ANTIFOG 7M (MISCELLANEOUS) ×2 IMPLANT
SOL .9 NS 3000ML IRR  AL (IV SOLUTION) ×1
SOL .9 NS 3000ML IRR UROMATIC (IV SOLUTION) ×1 IMPLANT
SPOGE SURGIFLO 8M (HEMOSTASIS) ×1
SPONGE SURGIFLO 8M (HEMOSTASIS) ×1 IMPLANT
STOCKINETTE M/LG 89821 (MISCELLANEOUS) ×2 IMPLANT
STRAP SAFETY 5IN WIDE (MISCELLANEOUS) IMPLANT
SUT ETHILON 3-0 (SUTURE) ×4 IMPLANT
SUT ETHILON 3-0 FS-10 30 BLK (SUTURE) ×2
SUT ETHILON 5-0 FS-2 18 BLK (SUTURE) ×2 IMPLANT
SUT VIC AB 3-0 SH 27 (SUTURE) ×1
SUT VIC AB 3-0 SH 27X BRD (SUTURE) ×1 IMPLANT
SUT VIC AB 4-0 FS2 27 (SUTURE) ×2 IMPLANT
SUTURE EHLN 3-0 FS-10 30 BLK (SUTURE) ×1 IMPLANT
SYR 10ML LL (SYRINGE) ×6 IMPLANT
TIP FAN IRRIG PULSAVAC PLUS (DISPOSABLE) IMPLANT

## 2019-03-21 NOTE — Anesthesia Postprocedure Evaluation (Signed)
Anesthesia Post Note  Patient: Troy Morse  Procedure(s) Performed: PART EXCISION BONE-PHALANX RIGHT GREAT TOE (Right )  Patient location during evaluation: PACU Anesthesia Type: General Level of consciousness: awake and alert Pain management: pain level controlled Vital Signs Assessment: post-procedure vital signs reviewed and stable Respiratory status: spontaneous breathing, nonlabored ventilation, respiratory function stable and patient connected to nasal cannula oxygen Cardiovascular status: blood pressure returned to baseline and stable Postop Assessment: no apparent nausea or vomiting Comments: Patient reports numbness on the very tip of his tongue which seems to be resolving.  No erythema or swelling on tongue, in oropharynx, face or neck.  No shortness of breath.  Patient informed that this should resolve on its own and if it does not, if it gets worse or if he has any other concerns that he should contact a doctor or call 911.  He voiced understanding.     Last Vitals:  Vitals:   03/21/19 1512 03/21/19 1516  BP: (!) 156/91 (!) 156/91  Pulse: 68 83  Resp: (!) 22 18  Temp: (!) 36.2 C (!) 36.4 C  SpO2: 97% 100%    Last Pain:  Vitals:   03/21/19 1516  TempSrc: Temporal  PainSc: 0-No pain                 Precious Haws Keylah Darwish

## 2019-03-21 NOTE — Anesthesia Post-op Follow-up Note (Signed)
Anesthesia QCDR form completed.        

## 2019-03-21 NOTE — Anesthesia Procedure Notes (Signed)
Procedure Name: LMA Insertion Date/Time: 03/21/2019 1:15 PM Performed by: Eben Burow, CRNA Pre-anesthesia Checklist: Patient identified, Emergency Drugs available, Suction available and Patient being monitored Patient Re-evaluated:Patient Re-evaluated prior to induction Oxygen Delivery Method: Circle system utilized Preoxygenation: Pre-oxygenation with 100% oxygen Induction Type: IV induction Ventilation: Mask ventilation without difficulty LMA: LMA inserted LMA Size: 4.5 Number of attempts: 1 Placement Confirmation: positive ETCO2 and breath sounds checked- equal and bilateral Tube secured with: Tape Dental Injury: Teeth and Oropharynx as per pre-operative assessment

## 2019-03-21 NOTE — Op Note (Signed)
Operative note   Surgeon:Verneda Hollopeter Lawyer: None    Preop diagnosis: Osteomyelitis right great toe    Postop diagnosis: Same    Procedure:1. Excision of bone proximal phalanx right great toe excision of bone distal phalanx right great toe  2.  Debridement of ulcer plantar right great toe to capsule.    EBL: Minimal    Anesthesia:local and general    Hemostasis: None    Specimen: Bone for pathology as well as bone for culture    Complications: None    Operative indications:Troy Morse is an 58 y.o. that presents today for surgical intervention.  The risks/benefits/alternatives/complications have been discussed and consent has been given.    Procedure:  Patient was brought into the OR and placed on the operating table in thesupine position. After anesthesia was obtained theright lower extremity was prepped and draped in usual sterile fashion.  Attention was directed to the right great toe where a S shaped incision was performed beginning distal lateral and ending proximal medial.  The transverse portion was overlying the interphalangeal joint.  Sharp and blunt dissection carried down to the long extensor tendon.  The extensor tendon was then tenotomized at this time.  This exposed the head of the proximal phalanx and base of the distal phalanx.  At this time there was noted to be a pathological fracture within the head of the proximal phalanx.  This was unstable and transverse in nature.  There was also a small fragment on the distal medial aspect of the proximal phalanx.  There was noted to be erosive changes on the lateral aspect of the distal phalanx as well.  The distal 1/2-2/3 of the proximal phalanx was then excised with a power saw and sent for pathological examination.  The proximal margin was inked.  The small fragment was sent for microbiology and culture.  The lateral aspect of the distal phalanx was also excised with a power saw.  This portion of bone was also sent  for microbiology.  The wound was flushed with copious amounts of irrigation.  The long extensor tendon was reapproximated with a 3-0 Vicryl.  The skin was reapproximated with a 3-0 nylon.  The plantar ulceration was noted and the non-viable tissue was excised down to the joint capsule.  This was performed with a 15 blade.  The ulceration plantarly measures 8 x 5 mm with a depth down to capsule.  The wound was infiltrated plantarly with Surgi-Flo with thrombin.  The plantar wound was packed with iodoform gauze dressing.  A bulky sterile dressing was then applied.    Patient tolerated the procedure and anesthesia well.  Was transported from the OR to the PACU with all vital signs stable and vascular status intact. To be discharged per routine protocol.  Will follow up in approximately 1 week in the outpatient clinic.

## 2019-03-21 NOTE — H&P (Signed)
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  The various methods of treatment have been discussed with the patient.  No guarantees were given.  After consideration of risks, benefits and other options for treatment, the patient has consented to surgery.  I have reviewed the patients' chart and labs.     A history and physical examination was performed in my office.  The patient was reexamined.  There have been no changes to this history and physical examination.  Samara Deist A

## 2019-03-21 NOTE — Transfer of Care (Signed)
Immediate Anesthesia Transfer of Care Note  Patient: Troy Morse  Procedure(s) Performed: PART EXCISION BONE-PHALANX RIGHT GREAT TOE (Right )  Patient Location: PACU  Anesthesia Type:General  Level of Consciousness: awake, alert , oriented and patient cooperative  Airway & Oxygen Therapy: Patient Spontanous Breathing and Patient connected to nasal cannula oxygen  Post-op Assessment: Report given to RN and Post -op Vital signs reviewed and stable  Post vital signs: Reviewed and stable  Last Vitals:  Vitals Value Taken Time  BP 160/95 03/21/19 1432  Temp    Pulse 68 03/21/19 1434  Resp 17 03/21/19 1434  SpO2 100 % 03/21/19 1434  Vitals shown include unvalidated device data.  Last Pain:  Vitals:   03/21/19 1054  TempSrc: Temporal  PainSc: 0-No pain         Complications: No apparent anesthesia complications

## 2019-03-21 NOTE — Discharge Instructions (Signed)
[facility]  POST OPERATIVE INSTRUCTIONS FOR DR. TROXLER, DR. FOWLER, AND DR. B[facility] PODIATRY DEPARTMENT   1. Take your medication as prescribed.  Pain medication should be taken only as needed.  2. Keep the dressing clean, dry and intact.  3. Keep your foot elevated above the heart level for the first 48 hours.  4. Walking to the bathroom and brief periods of walking are acceptable, unless we have instructed you to be non-weight bearing.  5. Always wear your post-op shoe when walking.  Always use your crutches if you are to be non-weight bearing.  6. Do not take a shower. Baths are permissible as long as the foot is kept out of the water.   7. Every hour you are awake:  - Bend your knee 15 times. - Flex foot 15 times - Massage calf 15 times  8. Call [facility] ([phone]) if any of the following problems occur: - You develop a temperature or fever. - The bandage becomes saturated with blood. - Medication does not stop your pain. - Injury of the foot occurs. - Any symptoms of infection including redness, odor, or red streaks running from wound.  AMBULATORY SURGERY  DISCHARGE INSTRUCTIONS  1) The drugs that you were given will stay in your system until tomorrow so for the next 24 hours you should not: A) Drive an automobile B) Make any legal decisions C) Drink any alcoholic beverage  2) You may resume regular meals tomorrow.  Today it is better to start with liquids and gradually work up to solid foods. You may eat anything you prefer, but it is better to start with liquids, then soup and crackers, and gradually work up to solid foods.  3) Please notify your doctor immediately if you have any unusual bleeding, trouble breathing, redness and pain at the surgery site, drainage, fever, or pain not relieved by medication.  4) Additional Instructions:   Please contact your physician with any problems or Same  Day Surgery at [phone], Monday through Friday 6 am to 4 pm, or [place] [facility] [place] [facility] number at [phone]. 

## 2019-03-21 NOTE — Anesthesia Preprocedure Evaluation (Signed)
Anesthesia Evaluation  Patient identified by MRN, date of birth, ID band Patient awake    Reviewed: Allergy & Precautions, H&P , NPO status , Patient's Chart, lab work & pertinent test results  Airway Mallampati: III  TM Distance: <3 FB Neck ROM: limited    Dental  (+) Chipped   Pulmonary neg pulmonary ROS, neg shortness of breath,           Cardiovascular Exercise Tolerance: Good hypertension, (-) angina+ CAD  (-) DOE      Neuro/Psych PSYCHIATRIC DISORDERS negative neurological ROS     GI/Hepatic Neg liver ROS, GERD  Medicated and Controlled,  Endo/Other  diabetes, Type 2, Insulin Dependent  Renal/GU Renal disease     Musculoskeletal   Abdominal   Peds  Hematology negative hematology ROS (+)   Anesthesia Other Findings Past Medical History: No date: CRD (chronic renal disease) No date: Diabetes mellitus without complication (HCC) No date: GERD (gastroesophageal reflux disease) No date: Hypertension  Past Surgical History: No date: BACK SURGERY No date: CHOLECYSTECTOMY No date: EYE SURGERY     Comment:  torn retina No date: HERNIA REPAIR 12/13/2018: INCISION AND DRAINAGE; Right     Comment:  Procedure: INCISION AND DRAINAGE GREAT RIGHT TOE;                Surgeon: Samara Deist, DPM;  Location: [facility] ORS;                Service: Podiatry;  Laterality: Right;  BMI    Body Mass Index: 30.25 kg/m      Reproductive/Obstetrics negative OB ROS                             Anesthesia Physical Anesthesia Plan  ASA: III  Anesthesia Plan: General LMA   Post-op Pain Management:    Induction: Intravenous  PONV Risk Score and Plan: Dexamethasone, Ondansetron, Midazolam and Treatment may vary due to age or medical condition  Airway Management Planned: LMA  Additional Equipment:   Intra-op Plan:   Post-operative Plan: Extubation in OR  Informed Consent: I have reviewed the  patients History and Physical, chart, labs and discussed the procedure including the risks, benefits and alternatives for the proposed anesthesia with the patient or authorized representative who has indicated his/her understanding and acceptance.     Dental Advisory Given  Plan Discussed with: Anesthesiologist, CRNA and Surgeon  Anesthesia Plan Comments: (Patient consented for risks of anesthesia including but not limited to:  - adverse reactions to medications - damage to teeth, lips or other oral mucosa - sore throat or hoarseness - Damage to heart, brain, lungs or loss of life  Patient voiced understanding.)        Anesthesia Quick Evaluation

## 2019-03-22 ENCOUNTER — Encounter: Payer: Self-pay | Admitting: Podiatry

## 2019-03-26 LAB — AEROBIC/ANAEROBIC CULTURE W GRAM STAIN (SURGICAL/DEEP WOUND)

## 2019-03-26 LAB — SURGICAL PATHOLOGY

## 2019-03-27 ENCOUNTER — Other Ambulatory Visit: Payer: Self-pay

## 2019-03-27 ENCOUNTER — Ambulatory Visit: Payer: BC Managed Care – PPO | Attending: Infectious Diseases | Admitting: Infectious Diseases

## 2019-03-27 ENCOUNTER — Encounter: Payer: Self-pay | Admitting: Infectious Diseases

## 2019-03-27 VITALS — BP 138/87 | HR 90 | Temp 97.7°F

## 2019-03-27 DIAGNOSIS — E1122 Type 2 diabetes mellitus with diabetic chronic kidney disease: Secondary | ICD-10-CM

## 2019-03-27 DIAGNOSIS — L089 Local infection of the skin and subcutaneous tissue, unspecified: Secondary | ICD-10-CM | POA: Diagnosis not present

## 2019-03-27 DIAGNOSIS — I129 Hypertensive chronic kidney disease with stage 1 through stage 4 chronic kidney disease, or unspecified chronic kidney disease: Secondary | ICD-10-CM

## 2019-03-27 DIAGNOSIS — E11628 Type 2 diabetes mellitus with other skin complications: Secondary | ICD-10-CM

## 2019-03-27 DIAGNOSIS — Z794 Long term (current) use of insulin: Secondary | ICD-10-CM

## 2019-03-27 DIAGNOSIS — B951 Streptococcus, group B, as the cause of diseases classified elsewhere: Secondary | ICD-10-CM

## 2019-03-27 DIAGNOSIS — Z888 Allergy status to other drugs, medicaments and biological substances status: Secondary | ICD-10-CM

## 2019-03-27 DIAGNOSIS — N189 Chronic kidney disease, unspecified: Secondary | ICD-10-CM

## 2019-03-27 DIAGNOSIS — M86171 Other acute osteomyelitis, right ankle and foot: Secondary | ICD-10-CM | POA: Diagnosis not present

## 2019-03-27 DIAGNOSIS — E1169 Type 2 diabetes mellitus with other specified complication: Secondary | ICD-10-CM | POA: Diagnosis not present

## 2019-03-27 DIAGNOSIS — E1142 Type 2 diabetes mellitus with diabetic polyneuropathy: Secondary | ICD-10-CM

## 2019-03-27 DIAGNOSIS — B965 Pseudomonas (aeruginosa) (mallei) (pseudomallei) as the cause of diseases classified elsewhere: Secondary | ICD-10-CM

## 2019-03-27 DIAGNOSIS — M869 Osteomyelitis, unspecified: Secondary | ICD-10-CM

## 2019-03-27 DIAGNOSIS — Z79899 Other long term (current) drug therapy: Secondary | ICD-10-CM

## 2019-03-27 MED ORDER — LEVOFLOXACIN 250 MG PO TABS: 250.0000 mg | ORAL_TABLET | Freq: Every day | ORAL | 0 refills | Status: DC

## 2019-03-27 NOTE — Patient Instructions (Addendum)
You are here for follow up of rt great toe infection- you have had a recent surgery and removal of infected bone and the culture is pseudomonas and you are on levaquin- will increase the dose to 752m. Need to get the following labs when you go to your doctor's office- CBC/CMP/ESR/CRP I am attaching levaquin information as well- watch for fluctuating blood sugar, and if you have any tendinitis or pain in your ligaments or joints please call me

## 2019-03-27 NOTE — Progress Notes (Signed)
NAME: Troy Morse  DOB: [birth year]  MRN: [id number]  Date/Time: 03/27/2019 10:00 AM   Subjective:  Follow-up visit for right great toe infection. ? Troy Morse is a 58 y.o. male with a history of diabetes mellitus, CKD, peripheral neuropathy, hypertension has had a right great toe infection since July 2020.  He received  IV ceftriaxone 7 weeks and po flagyl. Stopped  02/26/19. On 03/21/19 Dr.Fowler took him to OR and excised the proximal phalanx of the right great toe ,and distal phalanx and debrided the ulcer. Bone was sent for pathology and it showed acute osteomyelitis but margin was clear for OM. Culture positive for pseudomonas and he was started on levaquin on 03/24/19  He is doing better No pain, no fever   Past Medical History:  Diagnosis Date  . CRD (chronic renal disease)   . Diabetes mellitus without complication (HCC)   . GERD (gastroesophageal reflux disease)   . Hypertension     Past Surgical History:  Procedure Laterality Date  . BACK SURGERY    . BONE EXCISION Right 03/21/2019   Procedure: PART EXCISION BONE-PHALANX RIGHT GREAT TOE;  Surgeon: Gwyneth Revels, DPM;  Location: [facility];  Service: Podiatry;  Laterality: Right;  . CHOLECYSTECTOMY    . EYE SURGERY     torn retina  . HERNIA REPAIR    . INCISION AND DRAINAGE Right 12/13/2018   Procedure: INCISION AND DRAINAGE GREAT RIGHT TOE;  Surgeon: Gwyneth Revels, DPM;  Location: [facility];  Service: Podiatry;  Laterality: Right;    Social History   Socioeconomic History  . Marital status: Married    Spouse name: Not on file  . Number of children: Not on file  . Years of education: Not on file  . Highest education level: Not on file  Occupational History  . Not on file  Social Needs  . Financial resource strain: Not on file  . Food insecurity    Worry: Not on file    Inability: Not on file  . Transportation needs    Medical: Not on file    Non-medical: Not on file  Tobacco Use  . Smoking status: Never  Smoker  . Smokeless tobacco: Never Used  Substance and Sexual Activity  . Alcohol use: Not Currently  . Drug use: Not Currently    Comment: "IV drug usefor 5 years"greater than a year ago  . Sexual activity: Not on file  Lifestyle  . Physical activity    Days per week: Not on file    Minutes per session: Not on file  . Stress: Not on file  Relationships  . Social Musician on phone: Not on file    Gets together: Not on file    Attends religious service: Not on file    Active member of club or organization: Not on file    Attends meetings of clubs or organizations: Not on file    Relationship status: Not on file  . Intimate partner violence    Fear of current or ex partner: Not on file    Emotionally abused: Not on file    Physically abused: Not on file    Forced sexual activity: Not on file  Other Topics Concern  . Not on file  Social History Narrative  . Not on file    Family History  Problem Relation Age of Onset  . COPD Mother   . Heart disease Father   . COPD Father    Allergies  Allergen Reactions  . Empagliflozin Other (See Comments)    Pancreatitis   . Liraglutide Other (See Comments)    Pancreatitis ALSO KNOWN AS VICTOZA     ? Current Outpatient Medications  Medication Sig Dispense Refill  . aspirin EC 81 MG tablet Take 81 mg by mouth at bedtime.     . brimonidine (ALPHAGAN) 0.2 % ophthalmic solution Place 1 drop into both eyes 2 (two) times a day.    . gabapentin (NEURONTIN) 300 MG capsule Take 300 mg by mouth every evening.  2  . latanoprost (XALATAN) 0.005 % ophthalmic solution Place 1 drop into both eyes at bedtime.  2  . levofloxacin (LEVAQUIN) 500 MG tablet Take 1 tablet by mouth daily.    Marland Kitchen lisinopril (PRINIVIL,ZESTRIL) 20 MG tablet Take 10 mg by mouth daily.    . metFORMIN (GLUCOPHAGE) 500 MG tablet Take 1,000 mg by mouth at bedtime.     . NONFORMULARY OR COMPOUNDED ITEM Trimix (30/1/10)-(Pap/Phent/PGE)  Dosage: Inject 62ml per  injection  Prefilled Syringe # Test Dose 35ml vial Vial 43ml   Qty 35ml Refills 3  [place] [phone] Fax [phone] 5 each 3  . NOVOLOG FLEXPEN 100 UNIT/ML FlexPen Inject 0-14 Units into the skin See admin instructions. Inject under the skin 3 times daily at mealtimes according to sliding scale:  100-150: 0u 151-200: 5u 201-250: 8u 251-300: 10u 301-350: 12u >351: 14u  2  . oxyCODONE-acetaminophen (PERCOCET) 5-325 MG tablet Take 1-2 tablets by mouth every 6 (six) hours as needed for severe pain. 30 tablet 0  . pravastatin (PRAVACHOL) 80 MG tablet Take 80 mg by mouth at bedtime.     . sertraline (ZOLOFT) 100 MG tablet Take 100 mg by mouth at bedtime.     Tyler Aas FLEXTOUCH 200 UNIT/ML SOPN Inject 88-98 Units into the skin every evening.   11  . amoxicillin-clavulanate (AUGMENTIN) 875-125 MG tablet Take 1 tablet by mouth 2 (two) times daily for 14 days. (Patient not taking: Reported on 03/27/2019) 28 tablet 0   No current facility-administered medications for this visit.      Abtx:  Anti-infectives (From admission, onward)   None      REVIEW OF SYSTEMS:  Const: negative fever, negative chills, negative weight loss Eyes: negative diplopia or visual changes, negative eye pain ENT: negative coryza, negative sore throat Resp: negative cough, hemoptysis, dyspnea Cards: negative for chest pain, palpitations, lower extremity edema GU: negative for frequency, dysuria and hematuria GI: Negative for abdominal pain, diarrhea, bleeding, constipation Skin: negative for rash and pruritus Heme: negative for easy bruising and gum/nose bleeding MS: negative for myalgias, arthralgias, back pain and muscle weakness Neurolo: numbness, tingling in his feet Psych: negative for feelings of anxiety, depression  Endocrine: Diabetes better control now  allergy/Immunology- as listed above?  Objective:  VITALS:  BP 138/87 (BP Location: Left Arm, Patient Position: Sitting, Cuff  Size: Normal)   Pulse 90   Temp 97.7 F (36.5 C) (Oral)  PHYSICAL EXAM:  General: Alert, cooperative, no distress, appears stated age.  Head: Normocephalic, without obvious abnormality, atraumatic. Eyes: Conjunctivae clear, anicteric sclerae. Pupils are equal ENT Nares normal. No drainage or sinus tenderness. Lips, mucosa, and tongue normal. No Thrush Neck: Supple, symmetrical, no adenopathy, thyroid: non tender no carotid bruit and no JVD. Back: No CVA tenderness. Lungs: Clear to auscultation bilaterally. No Wheezing or Rhonchi. No rales. Heart: Regular rate and rhythm, no murmur, rub or gallop. Abdomen: Soft, non-tender,not distended. Bowel sounds normal. No masses  Extremities: rt great toe surgical site sutures intact- plantar ulcer          atraumatic, no cyanosis. No edema. No clubbing Skin: No rashes or lesions. Or bruising Lymph: Cervical, supraclavicular normal. Neurologic: Grossly non-focal Pertinent Labs Lab Results CBC    Component Value Date/Time   WBC 9.9 03/19/2019 1413   RBC 4.47 03/19/2019 1413   HGB 13.9 03/19/2019 1413   HCT 41.1 03/19/2019 1413   PLT 205 03/19/2019 1413   MCV 91.9 03/19/2019 1413   MCH 31.1 03/19/2019 1413   MCHC 33.8 03/19/2019 1413   RDW 11.9 03/19/2019 1413   LYMPHSABS 1.3 02/22/2019 1100   MONOABS 0.7 02/22/2019 1100   EOSABS 0.1 02/22/2019 1100   BASOSABS 0.0 02/22/2019 1100    CMP Latest Ref Rng & Units 03/19/2019 02/22/2019 02/08/2019  Glucose 70 - 99 mg/dL 161(W164(H) 960(A240(H) 540(J212(H)  BUN 6 - 20 mg/dL 81(X27(H) 91(Y28(H) 78(G30(H)  Creatinine 0.61 - 1.24 mg/dL 9.56(O1.47(H) 1.30(Q1.39(H) 6.57(Q1.39(H)  Sodium 135 - 145 mmol/L 138 137 139  Potassium 3.5 - 5.1 mmol/L 4.6 5.0 4.6  Chloride 98 - 111 mmol/L 105 104 108  CO2 22 - 32 mmol/L 24 23 21(L)  Calcium 8.9 - 10.3 mg/dL 9.8 9.3 9.1  Total Protein 6.5 - 8.1 g/dL - 6.6 6.6  Total Bilirubin 0.3 - 1.2 mg/dL - 0.5 0.3  Alkaline Phos 38 - 126 U/L - 48 46  AST 15 - 41 U/L - 21 22  ALT 0 - 44 U/L - 20 21    ? Impression/Recommendation ?58 year old male with a history of diabetes mellitus has had a wound on his right great toe for the last 8 months. Group B streptococcus culture in the wound in June and treated with IV ceftriaxone for 7 weeks  As hte wound did not heal he underwent surgent and excision of proximal and distal phalanx Osteomyelitis of the bone - proximal margin clear Pseudomonas in culture and started on levaquin 500mg  - will increase to 750 mg- currently was given a prescription for 10 days by Dr.Fowler- may need more- will discuss with him  ?Diabetes mellitus currently on metformin and Tresiba.  Also getting short-acting insulin.  His sugar i is better.   Last hemoglobin A1c is 9.5 from 02/24/2019.  CKD likely from diabetic nephropathy.  Is better it used to be creatinine of 2 in July and now it is 1.39.  Peripheral neuropathy on gabapentin  Follow up as needed ___________________________________________________ Discussed with patient, and  his wife  .  Note:  This document was prepared using Dragon voice recognition software and may include unintentional dictation errors.

## 2019-06-27 ENCOUNTER — Ambulatory Visit: Payer: BC Managed Care – PPO | Admitting: Urology

## 2019-06-27 ENCOUNTER — Other Ambulatory Visit: Payer: Self-pay

## 2019-06-27 ENCOUNTER — Telehealth: Payer: Self-pay | Admitting: Family Medicine

## 2019-06-27 ENCOUNTER — Encounter: Payer: Self-pay | Admitting: Urology

## 2019-06-27 VITALS — BP 104/70 | HR 94 | Ht 70.0 in | Wt 220.0 lb

## 2019-06-27 DIAGNOSIS — N5201 Erectile dysfunction due to arterial insufficiency: Secondary | ICD-10-CM

## 2019-06-27 DIAGNOSIS — N5319 Other ejaculatory dysfunction: Secondary | ICD-10-CM | POA: Diagnosis not present

## 2019-06-27 NOTE — Telephone Encounter (Signed)
-----   Message from Riki Altes, MD sent at 06/27/2019  2:38 PM EST ----- Regarding: Zoloft Patient at visit today inquired about stopping his Zoloft due to ejaculatory problems.  I did look it up and he should not stop cold [place] due to possible withdrawal symptoms.  He should really contact who is prescribing the medication or whoever is covering for that provider if not available regarding instructions for stopping or changing.

## 2019-06-27 NOTE — Progress Notes (Signed)
06/27/2019 2:36 PM   Troy Morse Jan 24, 1961 [id number]  Referring provider: Mickey Farber, MD [address] [facility] [place],  [place] [address]  Chief Complaint  Patient presents with  . Erectile Dysfunction    HPI: 59 y.o. male with a ED who I saw approximately 1 year ago.  He was started on Trimix.  At his initial test injection he achieved a firm erection with 3 mcg of medication.  He states the injections he has performed have not been as rigid.  His biggest complaint is inability to achieve orgasm.  He states he will almost get to that point however he is unable to achieve.  His erection has lasted after this occurs.  Medication review remarkable for Zoloft 100 mg.   PMH: Past Medical History:  Diagnosis Date  . CRD (chronic renal disease)   . Diabetes mellitus without complication (HCC)   . GERD (gastroesophageal reflux disease)   . Hypertension     Surgical History: Past Surgical History:  Procedure Laterality Date  . BACK SURGERY    . BONE EXCISION Right 03/21/2019   Procedure: PART EXCISION BONE-PHALANX RIGHT GREAT TOE;  Surgeon: Gwyneth Revels, DPM;  Location: [facility];  Service: Podiatry;  Laterality: Right;  . CHOLECYSTECTOMY    . EYE SURGERY     torn retina  . HERNIA REPAIR    . INCISION AND DRAINAGE Right 12/13/2018   Procedure: INCISION AND DRAINAGE GREAT RIGHT TOE;  Surgeon: Gwyneth Revels, DPM;  Location: [facility] OR[facility];  Service: Podiatry;  Laterality: Right;    Home Medications:  Allergies as of 06/27/2019      Reactions   Empagliflozin Other (See Comments)   Pancreatitis   Liraglutide Other (See Comments)   Pancreatitis ALSO KNOWN AS VICTOZA       Medication List       Accurate as of June 27, 2019  2:36 PM. If you have any questions, ask your nurse or doctor.        aspirin EC 81 MG tablet Take 81 mg by mouth at bedtime.   brimonidine 0.2 % ophthalmic solution Commonly known as: ALPHAGAN Place 1 drop into both eyes 2  (two) times a day.   gabapentin 300 MG capsule Commonly known as: NEURONTIN Take 300 mg by mouth every evening.   latanoprost 0.005 % ophthalmic solution Commonly known as: XALATAN Place 1 drop into both eyes at bedtime.   levofloxacin 500 MG tablet Commonly known as: LEVAQUIN Take 1 tablet by mouth daily.   levofloxacin 250 MG tablet Commonly known as: Levaquin Take 1 tablet (250 mg total) by mouth daily.   lisinopril 20 MG tablet Commonly known as: ZESTRIL Take 10 mg by mouth daily.   metFORMIN 500 MG tablet Commonly known as: GLUCOPHAGE Take 1,000 mg by mouth at bedtime.   NONFORMULARY OR COMPOUNDED ITEM Trimix (30/1/10)-(Pap/Phent/PGE)  Dosage: Inject 2ml per injection  Prefilled Syringe # Test Dose 70ml vial Vial 78ml   Qty 40ml Refills 3  [facility] [phone] Fax [phone]   NovoLOG FlexPen 100 UNIT/ML FlexPen Generic drug: insulin aspart Inject 0-14 Units into the skin See admin instructions. Inject under the skin 3 times daily at mealtimes according to sliding scale:  100-150: 0u 151-200: 5u 201-250: 8u 251-300: 10u 301-350: 12u >351: 14u   oxyCODONE-acetaminophen 5-325 MG tablet Commonly known as: Percocet Take 1-2 tablets by mouth every 6 (six) hours as needed for severe pain.   pravastatin 80 MG tablet Commonly known as:  PRAVACHOL Take 80 mg by mouth at bedtime.   sertraline 100 MG tablet Commonly known as: ZOLOFT Take 100 mg by mouth at bedtime.   Tyler Aas FlexTouch 200 UNIT/ML Sopn Generic drug: Insulin Degludec Inject 88-98 Units into the skin every evening.       Allergies:  Allergies  Allergen Reactions  . Empagliflozin Other (See Comments)    Pancreatitis   . Liraglutide Other (See Comments)    Pancreatitis ALSO KNOWN AS VICTOZA     Family History: Family History  Problem Relation Age of Onset  . COPD Mother   . Heart disease Father   . COPD Father     Social History:  reports that he has never  smoked. He has never used smokeless tobacco. He reports previous alcohol use. He reports previous drug use.  ROS: UROLOGY Frequent Urination?: No Hard to postpone urination?: No Burning/pain with urination?: No Get up at night to urinate?: No Leakage of urine?: No Urine stream starts and stops?: No Trouble starting stream?: No Do you have to strain to urinate?: No Blood in urine?: No Urinary tract infection?: No Sexually transmitted disease?: No Injury to kidneys or bladder?: No Painful intercourse?: No Weak stream?: No Erection problems?: No Penile pain?: No  Gastrointestinal Nausea?: No Vomiting?: No Indigestion/heartburn?: No Diarrhea?: No Constipation?: No  Constitutional Fever: No Night sweats?: No Weight loss?: No Fatigue?: No  Skin Skin rash/lesions?: No Itching?: No  Eyes Blurred vision?: No Double vision?: No  Ears/Nose/Throat Sore throat?: No Sinus problems?: No  Hematologic/Lymphatic Swollen glands?: No Easy bruising?: No  Cardiovascular Leg swelling?: No Chest pain?: No  Respiratory Cough?: No Shortness of breath?: No   Physical Exam: BP 104/70   Pulse 94   Ht 5\' 10"  (1.778 m)   Wt 220 lb (99.8 kg)   BMI 31.57 kg/m   Constitutional:  Alert and oriented, No acute distress. HEENT: [place] AT, moist mucus membranes.  Trachea midline, no masses. Cardiovascular: No clubbing, cyanosis, or edema. Respiratory: Normal respiratory effort, no increased work of breathing. GU: Phallus circumcised without lesions, testes descended bilaterally. Skin: No rashes, bruises or suspicious lesions. Neurologic: Grossly intact, no focal deficits, moving all 4 extremities. Psychiatric: Normal mood and affect.   Assessment & Plan:    - Erectile dysfunction I reviewed injection landmarks and marked an area for injection with a skin marker.  If he does not achieve an adequate erection would recommend he come back in for a provider performed injection before  changing the dose.  - Ejaculatory dysfunction He is on Zoloft and was informed this could be a factor in his inability to achieve orgasm.  He is not sure why he is on this medication and stated he was going to stop.  I recommend he check with his primary provider before doing this however he said his primary provider is no longer practicing.  After the patient left the office I did look up discontinuation of Zoloft and there are potential withdrawal symptoms from stopping cold [place].  The patient was contacted by our staff with this information and I recommended he contact the prescribing provider or if not available whoever is covering for that provider and not to stop cold [place].   Abbie Sons, [place] [address], [place], [place] [address] [phone]

## 2019-06-27 NOTE — Telephone Encounter (Signed)
Patient notified and will contact his doctor to taper off medication.

## 2020-06-28 ENCOUNTER — Ambulatory Visit: Payer: Self-pay | Admitting: Urology

## 2020-06-30 ENCOUNTER — Ambulatory Visit: Payer: Self-pay | Admitting: Urology

## 2020-06-30 ENCOUNTER — Encounter: Payer: Self-pay | Admitting: Urology

## 2020-09-09 IMAGING — MR MRI OF THE RIGHT FOREFOOT WITHOUT CONTRAST
5 series · 40 of 40 positions shown · non-contrast
Comparison: Radiographs from 12/11/2018

CLINICAL DATA: Swelling of the foot, diabetes

EXAM:
MRI OF THE RIGHT FOREFOOT WITHOUT CONTRAST
TECHNIQUE: Multiplanar, multisequence MR imaging of the right forefoot was
performed. No intravenous contrast was administered.

[Series 4: T1 · coronal · right · 3.0mm · 0.38mm/px · 11 of 45 slices shown (1 of 2)]
[im 1/45]
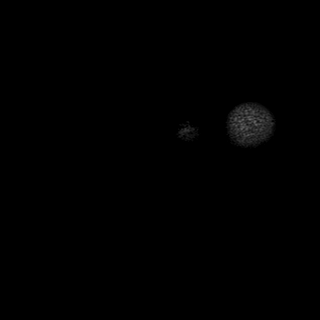
[im 5/45]
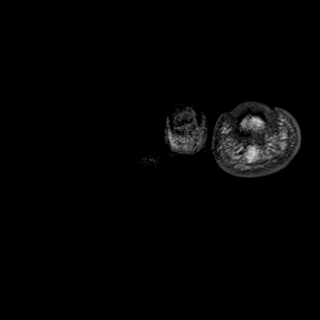
[im 9/45]
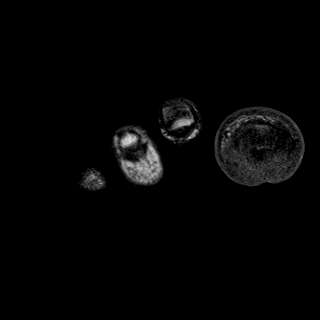
[im 14/45]
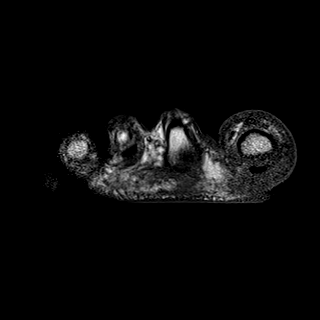
[im 18/45]
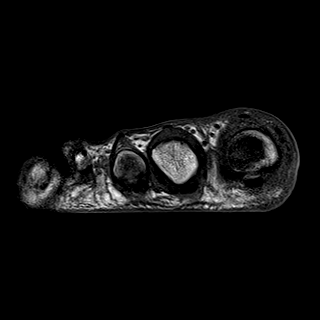
[im 23/45]
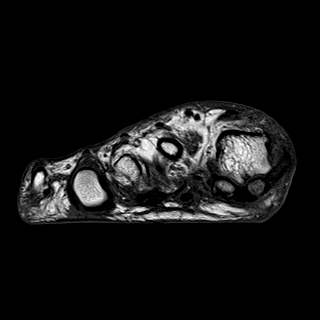
[im 27/45]
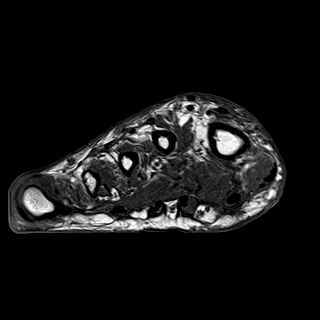
[im 31/45]
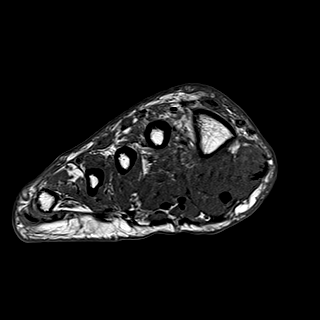
[im 36/45]
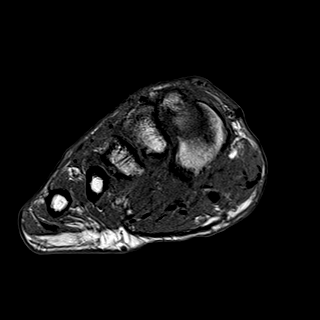
[im 40/45]
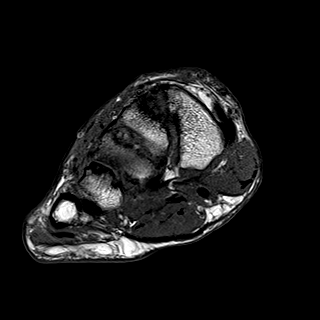
[im 45/45]
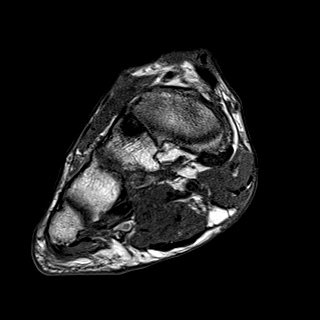

[Series 7: T1 · axial · right · 3.0mm · 0.70mm/px · z∈[-129,-64]mm · 5 of 20 slices shown (2 of 2)]
[im 1/20]
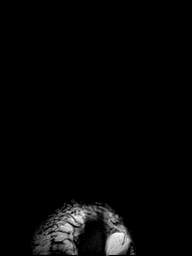
[im 5/20]
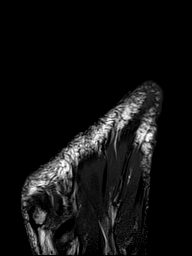
[im 10/20]
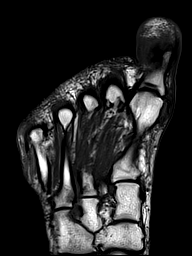
[im 15/20]
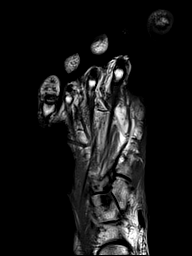
[im 20/20]
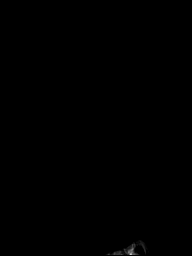

[Series 9: T2 · axial · right · 3.0mm · 0.70mm/px · z∈[-129,-64]mm · 5 of 20 slices shown]
[im 1/20]
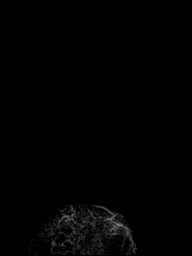
[im 5/20]
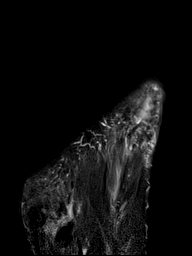
[im 10/20]
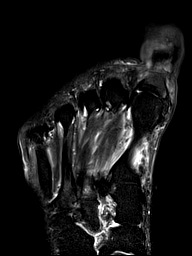
[im 15/20]
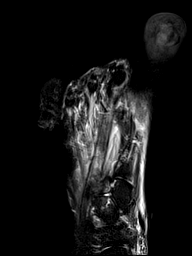
[im 20/20]
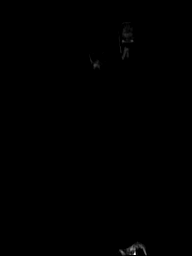

[Series 10: STIR · sagittal · right · 3.0mm · 0.62mm/px · 8 of 32 slices shown]
[im 1/32]
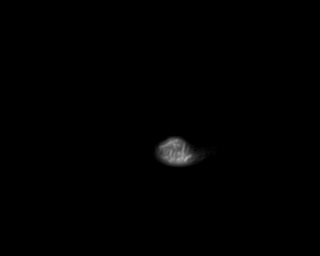
[im 5/32]
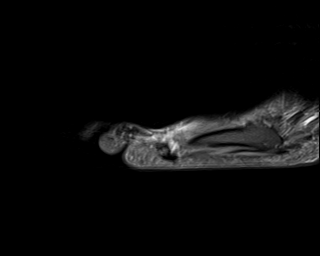
[im 9/32]
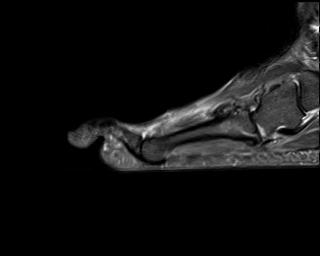
[im 14/32]
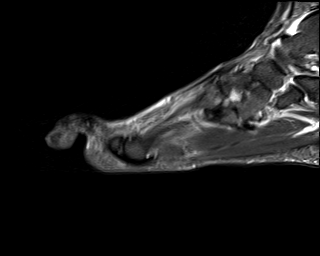
[im 18/32]
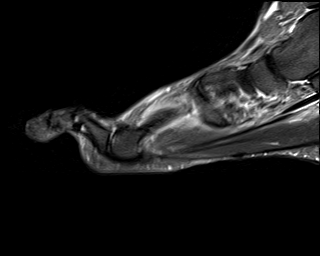
[im 23/32]
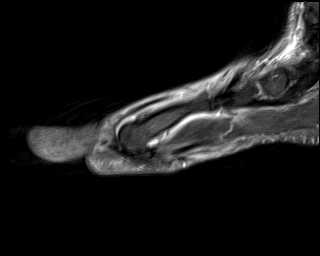
[im 27/32]
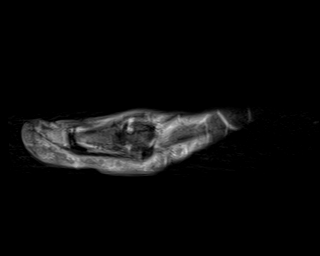
[im 32/32]
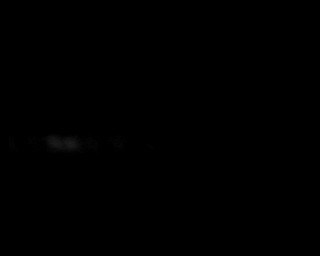

[Series 12: T2 fat-sat · coronal · right · 3.0mm · 0.38mm/px · 11 of 45 slices shown]
[im 1/45]
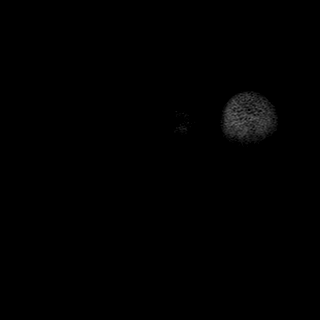
[im 5/45]
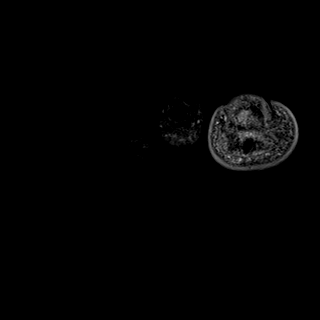
[im 9/45]
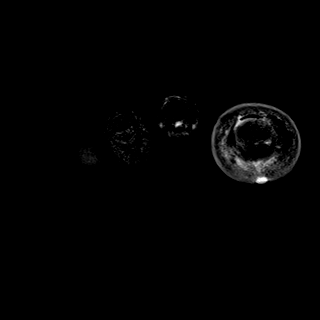
[im 14/45]
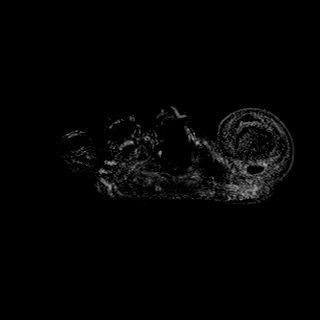
[im 18/45]
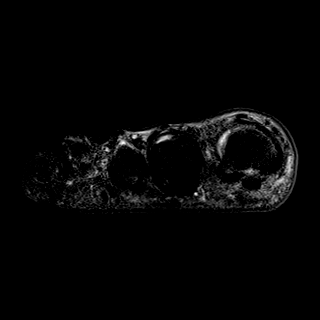
[im 23/45]
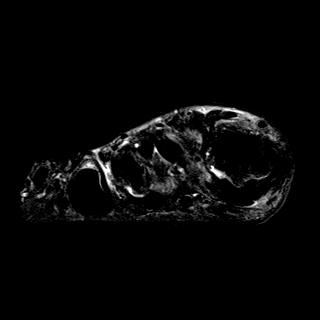
[im 27/45]
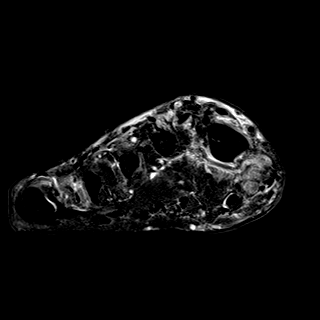
[im 31/45]
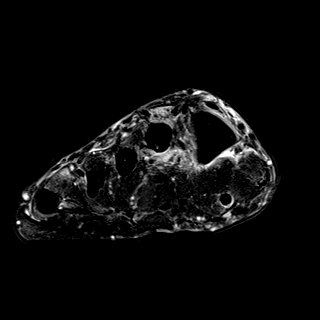
[im 36/45]
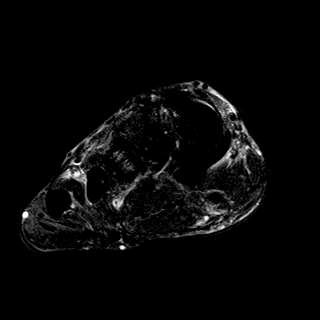
[im 40/45]
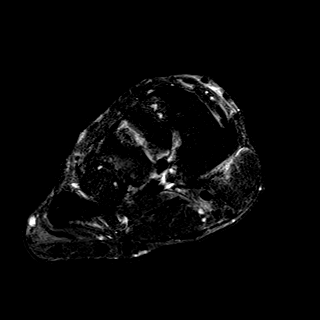
[im 45/45]
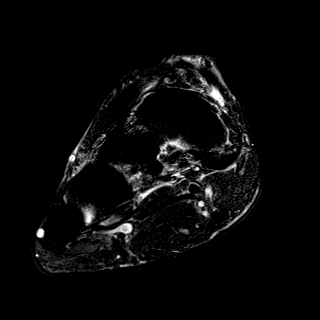

[40 of 40 positions shown; findings below may reference images not displayed]

FINDINGS: Bones/Joint/Cartilage

Abnormal edema signal in the proximal and distal phalanges of the
great toe, especially the distal phalanx, suspicious for
osteomyelitis. Erosion along the medial head of the first metatarsal
favoring gout.

Erosions along the lateral intermetatarsal articulations of the
bases of the second and third metatarsals, with adjacent marrow
edema tracking in the base of the third metatarsal. Subtle marrow
edema in the proximal metaphysis of the fourth metatarsal noted.

Osteochondral lesion or erosion along the distal margin of the
middle cuneiform medially.

Ligaments

The Lisfranc ligament appears intact on image [DATE].

Muscles and Tendons

Mild flexor hallucis longus tenosynovitis. Patchy edema signal in
the abductor hallucis, flexor hallucis brevis, and in the
interosseous muscles especially between the first and second
metatarsals.

Soft tissues

Mild subcutaneous edema tracks along the dorsum of the foot and into
the great toe.
IMPRESSION: 1. Abnormal marrow edema in the distal phalanx and to a lesser
extent in the proximal phalanx of the great toe, suspicious for
osteomyelitis. There is surrounding soft tissue edema in the great
toe likely from cellulitis, possibly a small plantar ulcer along the
great toe.
2. Intermetatarsal erosions along the bases of the second and third
metatarsals. Medial erosion along the first metatarsal head.
Appearance favors gout.
3. Non-fragmented osteochondral lesion or erosion along the distal
margin of the middle cuneiform.
4. Mild flexor hallucis longus tenosynovitis distally.
5. Patchy edema in some muscles in the forefoot which could be from
myositis or neurogenic edema.

## 2023-04-26 ENCOUNTER — Other Ambulatory Visit: Payer: Self-pay

## 2023-04-26 ENCOUNTER — Inpatient Hospital Stay
Admission: EM | Admit: 2023-04-26 | Discharge: 2023-04-29 | DRG: 065 | Disposition: A | Discharge status: Home / Self Care | Payer: Medicare Other | Attending: Internal Medicine | Admitting: Internal Medicine

## 2023-04-26 ENCOUNTER — Emergency Department: Payer: Medicare Other

## 2023-04-26 ENCOUNTER — Encounter: Payer: Self-pay | Admitting: Emergency Medicine

## 2023-04-26 DIAGNOSIS — I2510 Atherosclerotic heart disease of native coronary artery without angina pectoris: Secondary | ICD-10-CM | POA: Diagnosis present

## 2023-04-26 DIAGNOSIS — Z6831 Body mass index (BMI) 31.0-31.9, adult: Secondary | ICD-10-CM | POA: Diagnosis not present

## 2023-04-26 DIAGNOSIS — I129 Hypertensive chronic kidney disease with stage 1 through stage 4 chronic kidney disease, or unspecified chronic kidney disease: Secondary | ICD-10-CM | POA: Diagnosis present

## 2023-04-26 DIAGNOSIS — N189 Chronic kidney disease, unspecified: Secondary | ICD-10-CM | POA: Diagnosis not present

## 2023-04-26 DIAGNOSIS — I10 Essential (primary) hypertension: Secondary | ICD-10-CM | POA: Diagnosis not present

## 2023-04-26 DIAGNOSIS — R29702 NIHSS score 2: Secondary | ICD-10-CM | POA: Diagnosis present

## 2023-04-26 DIAGNOSIS — K7460 Unspecified cirrhosis of liver: Secondary | ICD-10-CM | POA: Diagnosis present

## 2023-04-26 DIAGNOSIS — I6389 Other cerebral infarction: Secondary | ICD-10-CM | POA: Diagnosis not present

## 2023-04-26 DIAGNOSIS — Z8249 Family history of ischemic heart disease and other diseases of the circulatory system: Secondary | ICD-10-CM | POA: Diagnosis not present

## 2023-04-26 DIAGNOSIS — K219 Gastro-esophageal reflux disease without esophagitis: Secondary | ICD-10-CM | POA: Diagnosis present

## 2023-04-26 DIAGNOSIS — G8194 Hemiplegia, unspecified affecting left nondominant side: Secondary | ICD-10-CM | POA: Diagnosis present

## 2023-04-26 DIAGNOSIS — Z794 Long term (current) use of insulin: Secondary | ICD-10-CM

## 2023-04-26 DIAGNOSIS — E781 Pure hyperglyceridemia: Secondary | ICD-10-CM | POA: Diagnosis present

## 2023-04-26 DIAGNOSIS — I639 Cerebral infarction, unspecified: Principal | ICD-10-CM | POA: Diagnosis present

## 2023-04-26 DIAGNOSIS — E860 Dehydration: Secondary | ICD-10-CM | POA: Diagnosis present

## 2023-04-26 DIAGNOSIS — I63541 Cerebral infarction due to unspecified occlusion or stenosis of right cerebellar artery: Principal | ICD-10-CM | POA: Diagnosis present

## 2023-04-26 DIAGNOSIS — Z79899 Other long term (current) drug therapy: Secondary | ICD-10-CM

## 2023-04-26 DIAGNOSIS — E1142 Type 2 diabetes mellitus with diabetic polyneuropathy: Secondary | ICD-10-CM | POA: Diagnosis present

## 2023-04-26 DIAGNOSIS — E66811 Obesity, class 1: Secondary | ICD-10-CM | POA: Diagnosis not present

## 2023-04-26 DIAGNOSIS — Z9049 Acquired absence of other specified parts of digestive tract: Secondary | ICD-10-CM

## 2023-04-26 DIAGNOSIS — Z7982 Long term (current) use of aspirin: Secondary | ICD-10-CM

## 2023-04-26 DIAGNOSIS — Z888 Allergy status to other drugs, medicaments and biological substances status: Secondary | ICD-10-CM | POA: Diagnosis not present

## 2023-04-26 DIAGNOSIS — E785 Hyperlipidemia, unspecified: Secondary | ICD-10-CM | POA: Diagnosis not present

## 2023-04-26 DIAGNOSIS — E872 Acidosis, unspecified: Secondary | ICD-10-CM | POA: Diagnosis present

## 2023-04-26 DIAGNOSIS — N179 Acute kidney failure, unspecified: Secondary | ICD-10-CM | POA: Diagnosis present

## 2023-04-26 DIAGNOSIS — I4510 Unspecified right bundle-branch block: Secondary | ICD-10-CM | POA: Diagnosis present

## 2023-04-26 DIAGNOSIS — E669 Obesity, unspecified: Secondary | ICD-10-CM | POA: Insufficient documentation

## 2023-04-26 DIAGNOSIS — R55 Syncope and collapse: Secondary | ICD-10-CM

## 2023-04-26 DIAGNOSIS — R2681 Unsteadiness on feet: Secondary | ICD-10-CM | POA: Diagnosis present

## 2023-04-26 DIAGNOSIS — N1831 Chronic kidney disease, stage 3a: Secondary | ICD-10-CM | POA: Diagnosis present

## 2023-04-26 DIAGNOSIS — F411 Generalized anxiety disorder: Secondary | ICD-10-CM | POA: Diagnosis present

## 2023-04-26 DIAGNOSIS — E1122 Type 2 diabetes mellitus with diabetic chronic kidney disease: Secondary | ICD-10-CM | POA: Diagnosis present

## 2023-04-26 DIAGNOSIS — E11319 Type 2 diabetes mellitus with unspecified diabetic retinopathy without macular edema: Secondary | ICD-10-CM | POA: Diagnosis present

## 2023-04-26 DIAGNOSIS — R471 Dysarthria and anarthria: Secondary | ICD-10-CM | POA: Diagnosis present

## 2023-04-26 DIAGNOSIS — E871 Hypo-osmolality and hyponatremia: Secondary | ICD-10-CM | POA: Diagnosis present

## 2023-04-26 DIAGNOSIS — E6609 Other obesity due to excess calories: Secondary | ICD-10-CM | POA: Diagnosis not present

## 2023-04-26 DIAGNOSIS — Z7984 Long term (current) use of oral hypoglycemic drugs: Secondary | ICD-10-CM

## 2023-04-26 DIAGNOSIS — G250 Essential tremor: Secondary | ICD-10-CM | POA: Diagnosis present

## 2023-04-26 DIAGNOSIS — Z825 Family history of asthma and other chronic lower respiratory diseases: Secondary | ICD-10-CM

## 2023-04-26 LAB — LACTIC ACID, PLASMA
Lactic Acid, Venous: 3 mmol/L (ref 0.5–1.9)
Lactic Acid, Venous: 3.4 mmol/L (ref 0.5–1.9)

## 2023-04-26 LAB — CBC
HCT: 47.6 % (ref 39.0–52.0)
Hemoglobin: 16.3 g/dL (ref 13.0–17.0)
MCH: 33.4 pg (ref 26.0–34.0)
MCHC: 34.2 g/dL (ref 30.0–36.0)
MCV: 97.5 fL (ref 80.0–100.0)
Platelets: 180 10*3/uL (ref 150–400)
RBC: 4.88 MIL/uL (ref 4.22–5.81)
RDW: 11.9 % (ref 11.5–15.5)
WBC: 7.4 10*3/uL (ref 4.0–10.5)
nRBC: 0 % (ref 0.0–0.2)

## 2023-04-26 LAB — DIFFERENTIAL
Abs Immature Granulocytes: 0.03 10*3/uL (ref 0.00–0.07)
Basophils Absolute: 0 10*3/uL (ref 0.0–0.1)
Basophils Relative: 0 %
Eosinophils Absolute: 0.1 10*3/uL (ref 0.0–0.5)
Eosinophils Relative: 1 %
Immature Granulocytes: 0 %
Lymphocytes Relative: 19 %
Lymphs Abs: 1.4 10*3/uL (ref 0.7–4.0)
Monocytes Absolute: 0.6 10*3/uL (ref 0.1–1.0)
Monocytes Relative: 8 %
Neutro Abs: 5.4 10*3/uL (ref 1.7–7.7)
Neutrophils Relative %: 72 %

## 2023-04-26 LAB — COMPREHENSIVE METABOLIC PANEL
ALT: 30 U/L (ref 0–44)
AST: 25 U/L (ref 15–41)
Albumin: 4.1 g/dL (ref 3.5–5.0)
Alkaline Phosphatase: 82 U/L (ref 38–126)
Anion gap: 9 (ref 5–15)
BUN: 30 mg/dL — ABNORMAL HIGH (ref 8–23)
CO2: 26 mmol/L (ref 22–32)
Calcium: 8.7 mg/dL — ABNORMAL LOW (ref 8.9–10.3)
Chloride: 98 mmol/L (ref 98–111)
Creatinine, Ser: 1.76 mg/dL — ABNORMAL HIGH (ref 0.61–1.24)
GFR, Estimated: 43 mL/min — ABNORMAL LOW (ref >60)
Glucose, Bld: 251 mg/dL — ABNORMAL HIGH (ref 70–99)
Potassium: 4.4 mmol/L (ref 3.5–5.1)
Sodium: 133 mmol/L — ABNORMAL LOW (ref 135–145)
Total Bilirubin: 0.7 mg/dL (ref <1.2)
Total Protein: 7.5 g/dL (ref 6.5–8.1)

## 2023-04-26 LAB — URINALYSIS, ROUTINE W REFLEX MICROSCOPIC
Bacteria, UA: NONE SEEN
Bilirubin Urine: NEGATIVE
Glucose, UA: >=500 mg/dL — AB
Hgb urine dipstick: NEGATIVE
Ketones, ur: NEGATIVE mg/dL
Leukocytes,Ua: NEGATIVE
Nitrite: NEGATIVE
Protein, ur: NEGATIVE mg/dL
Specific Gravity, Urine: 1.026 (ref 1.005–1.030)
Squamous Epithelial / HPF: 0 /[HPF] (ref 0–5)
pH: 5 (ref 5.0–8.0)

## 2023-04-26 LAB — ETHANOL: Alcohol, Ethyl (B): <10 mg/dL (ref <10)

## 2023-04-26 LAB — LIPASE, BLOOD: Lipase: 44 U/L (ref 11–51)

## 2023-04-26 LAB — CBG MONITORING, ED: Glucose-Capillary: 222 mg/dL — ABNORMAL HIGH (ref 70–99)

## 2023-04-26 LAB — APTT: aPTT: 26 s (ref 24–36)

## 2023-04-26 LAB — PROTIME-INR
INR: 1 (ref 0.8–1.2)
Prothrombin Time: 13.6 s (ref 11.4–15.2)

## 2023-04-26 LAB — TROPONIN I (HIGH SENSITIVITY)
Troponin I (High Sensitivity): 8 ng/L (ref <18)
Troponin I (High Sensitivity): 8 ng/L (ref <18)

## 2023-04-26 MED ORDER — ATORVASTATIN CALCIUM 20 MG PO TABS
40.0000 mg | ORAL_TABLET | Freq: Every day | ORAL | Status: DC
  Administered 2023-04-27 – 2023-04-28 (×3): 40 mg via ORAL
  Filled 2023-04-26 (×4): qty 2

## 2023-04-26 MED ORDER — SODIUM CHLORIDE 0.9% FLUSH: 3.0000 mL | Freq: Once | INTRAVENOUS | Status: DC

## 2023-04-26 MED ORDER — ICOSAPENT ETHYL 1 G PO CAPS
2.0000 g | ORAL_CAPSULE | Freq: Every day | ORAL | Status: DC
  Administered 2023-04-27 – 2023-04-29 (×3): 2 g via ORAL
  Filled 2023-04-26 (×4): qty 2

## 2023-04-26 MED ORDER — SERTRALINE HCL 50 MG PO TABS
100.0000 mg | ORAL_TABLET | Freq: Every day | ORAL | Status: DC
  Administered 2023-04-27 – 2023-04-28 (×3): 100 mg via ORAL
  Filled 2023-04-26 (×3): qty 2

## 2023-04-26 MED ORDER — LISINOPRIL 10 MG PO TABS: 10.0000 mg | ORAL_TABLET | Freq: Every day | ORAL | Status: DC

## 2023-04-26 MED ORDER — STROKE: EARLY STAGES OF RECOVERY BOOK: Freq: Once | Status: AC

## 2023-04-26 MED ORDER — LORAZEPAM 2 MG/ML IJ SOLN
1.0000 mg | Freq: Once | INTRAMUSCULAR | Status: AC | PRN
  Administered 2023-04-26: 1 mg via INTRAVENOUS
  Filled 2023-04-26: qty 1

## 2023-04-26 MED ORDER — INSULIN ASPART 100 UNIT/ML IJ SOLN
0.0000 [IU] | Freq: Three times a day (TID) | INTRAMUSCULAR | Status: DC
  Administered 2023-04-27: 5 [IU] via SUBCUTANEOUS
  Administered 2023-04-27 (×2): 3 [IU] via SUBCUTANEOUS
  Administered 2023-04-28: 2 [IU] via SUBCUTANEOUS
  Administered 2023-04-28 – 2023-04-29 (×3): 3 [IU] via SUBCUTANEOUS
  Filled 2023-04-26 (×6): qty 1

## 2023-04-26 MED ORDER — ENOXAPARIN SODIUM 60 MG/0.6ML IJ SOSY
50.0000 mg | PREFILLED_SYRINGE | INTRAMUSCULAR | Status: DC
  Administered 2023-04-27 – 2023-04-28 (×2): 50 mg via SUBCUTANEOUS
  Filled 2023-04-26 (×3): qty 0.6

## 2023-04-26 MED ORDER — SENNOSIDES-DOCUSATE SODIUM 8.6-50 MG PO TABS: 1.0000 | ORAL_TABLET | Freq: Every evening | ORAL | Status: DC | PRN

## 2023-04-26 MED ORDER — ASPIRIN 81 MG PO TBEC
81.0000 mg | DELAYED_RELEASE_TABLET | Freq: Every day | ORAL | Status: DC
  Administered 2023-04-27 – 2023-04-28 (×2): 81 mg via ORAL
  Filled 2023-04-26 (×2): qty 1

## 2023-04-26 MED ORDER — ACETAMINOPHEN 650 MG RE SUPP: 650.0000 mg | RECTAL | Status: DC | PRN

## 2023-04-26 MED ORDER — EMPAGLIFLOZIN 25 MG PO TABS
25.0000 mg | ORAL_TABLET | Freq: Every day | ORAL | Status: DC
  Administered 2023-04-27 – 2023-04-29 (×3): 25 mg via ORAL
  Filled 2023-04-26 (×4): qty 1

## 2023-04-26 MED ORDER — ONDANSETRON HCL 4 MG/2ML IJ SOLN: 4.0000 mg | INTRAMUSCULAR | Status: DC | PRN

## 2023-04-26 MED ORDER — SODIUM CHLORIDE 0.9 % IV SOLN: INTRAVENOUS | Status: DC

## 2023-04-26 MED ORDER — TRAZODONE HCL 50 MG PO TABS
25.0000 mg | ORAL_TABLET | Freq: Every evening | ORAL | Status: DC | PRN
  Administered 2023-04-27 – 2023-04-28 (×2): 25 mg via ORAL
  Filled 2023-04-26 (×2): qty 1

## 2023-04-26 MED ORDER — ALPHA-LIPOIC ACID 200 MG PO CAPS: 1.0000 | ORAL_CAPSULE | Freq: Every day | ORAL | Status: DC

## 2023-04-26 MED ORDER — INSULIN ASPART 100 UNIT/ML IJ SOLN
0.0000 [IU] | Freq: Every day | INTRAMUSCULAR | Status: DC
  Administered 2023-04-27: 3 [IU] via SUBCUTANEOUS
  Administered 2023-04-27: 2 [IU] via SUBCUTANEOUS
  Filled 2023-04-26 (×3): qty 1

## 2023-04-26 MED ORDER — GABAPENTIN 300 MG PO CAPS
300.0000 mg | ORAL_CAPSULE | Freq: Every evening | ORAL | Status: DC
  Administered 2023-04-27 – 2023-04-28 (×2): 300 mg via ORAL
  Filled 2023-04-26 (×2): qty 1

## 2023-04-26 MED ORDER — ACETAMINOPHEN 325 MG PO TABS: 650.0000 mg | ORAL_TABLET | ORAL | Status: DC | PRN

## 2023-04-26 MED ORDER — INSULIN GLARGINE-YFGN 100 UNIT/ML ~~LOC~~ SOLN
88.0000 [IU] | Freq: Every evening | SUBCUTANEOUS | Status: DC
  Administered 2023-04-27 – 2023-04-28 (×2): 88 [IU] via SUBCUTANEOUS
  Filled 2023-04-26 (×4): qty 0.88

## 2023-04-26 MED ORDER — SODIUM CHLORIDE 0.9 % IV BOLUS
1000.0000 mL | Freq: Once | INTRAVENOUS | Status: AC
Start: 2023-04-26 — End: 2023-04-26
  Administered 2023-04-26: 1000 mL via INTRAVENOUS

## 2023-04-26 MED ORDER — LATANOPROST 0.005 % OP SOLN
1.0000 [drp] | Freq: Every day | OPHTHALMIC | Status: DC
  Filled 2023-04-26: qty 2.5

## 2023-04-26 MED ORDER — ACETAMINOPHEN 160 MG/5ML PO SOLN: 650.0000 mg | ORAL | Status: DC | PRN

## 2023-04-26 MED ORDER — CLOPIDOGREL BISULFATE 75 MG PO TABS
75.0000 mg | ORAL_TABLET | Freq: Every day | ORAL | Status: DC
  Administered 2023-04-27: 75 mg via ORAL
  Filled 2023-04-26 (×2): qty 1

## 2023-04-26 MED ORDER — MELATONIN 5 MG PO TABS
5.0000 mg | ORAL_TABLET | Freq: Every day | ORAL | Status: DC
  Administered 2023-04-27 – 2023-04-28 (×3): 5 mg via ORAL
  Filled 2023-04-26 (×3): qty 1

## 2023-04-26 MED ORDER — BRIMONIDINE TARTRATE 0.2 % OP SOLN
1.0000 [drp] | Freq: Two times a day (BID) | OPHTHALMIC | Status: DC
  Filled 2023-04-26 (×2): qty 5

## 2023-04-26 NOTE — ED Triage Notes (Signed)
First Nurse Note:  Pt via [facility] from home. Pt has been not eating for the last couple of days. Pt was had a syncopal episode at the dinner table. Pt is A&OX4 and NAD 93% on RA  80 HR 12 lead SR with PVC 283 CBG  20 G L AC

## 2023-04-26 NOTE — ED Provider Notes (Signed)
[facility] Provider Note    Event Date/Time   First MD Initiated Contact with Patient 04/26/23 1918     (approximate)   History   Loss of Consciousness   HPI  Troy Morse is a 61 y.o. male with history of type 2 diabetes, hypertension, CKD, hyperlipidemia, pancreatitis, and generalized anxiety disorder who presents with acute onset of weakness and altered mental status.  Per the patient and wife, the patient had been ill for approximately 1 week with nausea, vomiting, generalized weakness.  He was seen in the [facility] ED 3 days ago for the symptoms and was plan for inpatient admission but ultimately left AMA.  He was seen by primary care 2 days ago for the same.  Today while eating he suddenly lost consciousness and collapsed.  Since that time the patient has been somewhat confused.  The wife has noted that his speech has been slurred.  In triage he was noted to have possible left-sided drift.  I reviewed the past medical records.  The patient was seen at the [facility] ED on 11/11 with upper abdominal pain, nausea, and vomiting.  Workup was negative but he was recommended to be admitted for pain control and observation which she declined.    Physical Exam   Triage Vital Signs: ED Triage Vitals  Encounter Vitals Group     BP 04/26/23 1906 112/74     Systolic BP Percentile --      Diastolic BP Percentile --      Pulse Rate 04/26/23 1906 93     Resp 04/26/23 1906 18     Temp 04/26/23 1906 98.3 F (36.8 C)     Temp src --      SpO2 04/26/23 1906 95 %     Weight 04/26/23 1910 220 lb 0.3 oz (99.8 kg)     Height 04/26/23 1910 5\' 10"  (1.778 m)     Head Circumference --      Peak Flow --      Pain Score 04/26/23 1910 0     Pain Loc --      Pain Education --      Exclude from Growth Chart --     Most recent vital signs: Vitals:   04/26/23 1906  BP: 112/74  Pulse: 93  Resp: 18  Temp: 98.3 F (36.8 C)  SpO2: 95%      General: Alert and oriented, no distress.  CV:  Good peripheral perfusion.  Resp:  Normal effort.  Abd:  No distention.  Other:  EOMI.  PERRLA.  No facial droop.  Normal speech.  Motor intact in all extremities.  No drift.  No ataxia.   ED Results / Procedures / Treatments   Labs (all labs ordered are listed, but only abnormal results are displayed) Labs Reviewed  COMPREHENSIVE METABOLIC PANEL - Abnormal; Notable for the following components:      Result Value   Sodium 133 (*)    Glucose, Bld 251 (*)    BUN 30 (*)    Creatinine, Ser 1.76 (*)    Calcium 8.7 (*)    GFR, Estimated 43 (*)    All other components within normal limits  LACTIC ACID, PLASMA - Abnormal; Notable for the following components:   Lactic Acid, Venous 3.0 (*)    All other components within normal limits  LACTIC ACID, PLASMA - Abnormal; Notable for the following components:   Lactic Acid, Venous 3.4 (*)    All  other components within normal limits  URINALYSIS, ROUTINE W REFLEX MICROSCOPIC - Abnormal; Notable for the following components:   Color, Urine YELLOW (*)    APPearance CLEAR (*)    Glucose, UA >=500 (*)    All other components within normal limits  CBG MONITORING, ED - Abnormal; Notable for the following components:   Glucose-Capillary 222 (*)    All other components within normal limits  PROTIME-INR  APTT  CBC  DIFFERENTIAL  ETHANOL  LIPASE, BLOOD  TROPONIN I (HIGH SENSITIVITY)  TROPONIN I (HIGH SENSITIVITY)     EKG  ED ECG REPORT I, Dionne Bucy, the attending physician, personally viewed and interpreted this ECG.  Date: 04/26/2023 EKG Time: 1928 Rate: 82 Rhythm: normal sinus rhythm QRS Axis: normal Intervals: Incomplete RBBB ST/T Wave abnormalities: Nonspecific ST abnormalities Narrative Interpretation: no evidence of acute ischemia    RADIOLOGY  CT head: I independently viewed and interpreted the images; there is no ICH.  IMPRESSION:  Age indeterminate, but  likely chronic infarcts in the right  occipital lobe and bilateral cerebellum. No hemorrhage. Recommend  further evaluation with a brain MRI for more definitive  characterization.    Findings were discussed with Dr. Marisa Severin on 04/26/23 at 7:27 PM.      Electronically Signed   MR brain:  IMPRESSION:  1. 1 cm acute ischemic nonhemorrhagic left dorsal pontine infarct,  with additional punctate non hemorrhagic right cerebellar infarct.  2. Underlying age-related cerebral atrophy with moderately advanced  chronic microvascular ischemic disease, with a few scattered remote  lacunar infarcts involving the bilateral thalami, pons, and  cerebellum.  3. Small chronic bilateral PCA distribution infarcts, right larger  than left.     PROCEDURES:  Critical Care performed: Yes, see critical care procedure note(s)  .Critical Care  Performed by: Dionne Bucy, MD Authorized by: Dionne Bucy, MD   Critical care provider statement:    Critical care time (minutes):  30   Critical care time was exclusive of:  Separately billable procedures and treating other patients   Critical care was necessary to treat or prevent imminent or life-threatening deterioration of the following conditions:  CNS failure or compromise   Critical care was time spent personally by me on the following activities:  Development of treatment plan with patient or surrogate, discussions with consultants, evaluation of patient's response to treatment, examination of patient, ordering and review of laboratory studies, ordering and review of radiographic studies, ordering and performing treatments and interventions, pulse oximetry, re-evaluation of patient's condition, review of old charts and obtaining history from patient or surrogate   Care discussed with: admitting provider      MEDICATIONS ORDERED IN ED: Medications  sodium chloride flush (NS) 0.9 % injection 3 mL (3 mLs Intravenous Not Given 04/26/23 2215)   sodium chloride 0.9 % bolus 1,000 mL (0 mLs Intravenous Stopped 04/26/23 2215)  LORazepam (ATIVAN) injection 1 mg (1 mg Intravenous Given 04/26/23 2017)     IMPRESSION / MDM / ASSESSMENT AND PLAN / ED COURSE  I reviewed the triage vital signs and the nursing notes.  62 year old male with PMH as noted above presents with acute onset of generalized weakness and altered mental status with possible slurred speech, occurring in the context of approximately 1 week of generalized weakness and GI symptoms.  On exam currently, the patient has no focal neurologic deficits.  Differential diagnosis includes, but is not limited to, dehydration, electrolyte abnormality, other metabolic disturbance, gastroenteritis, UTI, other infection, CVA, TIA, seizure, syncope.  CT  head shows age-indeterminate infarcts likely chronic.  Code stroke was activated in triage.  I consulted and discussed case with Dr. Iver Nestle who evaluated the patient.  She recommends medical workup for generalized weakness and AMS, MRI for further evaluation to rule out CVA.  Patient's presentation is most consistent with acute presentation with potential threat to life or bodily function.  The patient is on the cardiac monitor to evaluate for evidence of arrhythmia and/or significant heart rate changes.  ----------------------------------------- 10:20 PM on 04/26/2023 -----------------------------------------  Initial lactate was elevated at 3.0.  However the patient's vital signs are normal, he has no leukocytosis on CBC or any specific findings to suggest infection or sepsis.  There is no clinical evidence of sepsis at this time.  Lab workup is otherwise unremarkable.  Differential for the elevated lactate includes dehydration, vomiting, or possibly seizure.  Repeat after fluids is pending.  MRI of the brain shows small acute infarcts.  Clinically, the patient has improved and appears more alert and comfortable.  The patient will need  admission for further workup and management.  I consulted Dr. Arville Care from the hospitalist service; based on our discussion he agrees to evaluate the patient for admission.  ----------------------------------------- 10:45 PM on 04/26/2023 -----------------------------------------  Sepsis reassessment: Repeat lactate is slightly higher than previous.  There continues to be no evidence of acute sepsis or septic shock.  FINAL CLINICAL IMPRESSION(S) / ED DIAGNOSES   Final diagnoses:  Acute CVA (cerebrovascular accident) (HCC)  Lactic acidosis  Syncope, unspecified syncope type     Rx / DC Orders   ED Discharge Orders     None        Note:  This document was prepared using Dragon voice recognition software and may include unintentional dictation errors.    Dionne Bucy, MD 04/26/23 2246

## 2023-04-26 NOTE — ED Notes (Signed)
called to carelink to activate code stroke per RN Lisa/rep:kianna

## 2023-04-26 NOTE — ED Triage Notes (Signed)
Patient brought in by [facility] tonight from home with complaints of syncopal episode and loss of consciousness while eating dinner tonight at approx 530. Had a fork up to his mouth when he just dropped it and then lost consciousness, she called EMS who then arrived within 10 minutes. Wife endorses she feels his speech is slurred and he is altered mental status.  Slight grip strength deficit on left with left leg drift as well.

## 2023-04-26 NOTE — Progress Notes (Signed)
PHARMACIST - PHYSICIAN COMMUNICATION  CONCERNING:  Enoxaparin (Lovenox) for DVT Prophylaxis    RECOMMENDATION: Patient was prescribed enoxaprin 40mg  q24 hours for VTE prophylaxis.   Filed Weights   04/26/23 1910  Weight: 99.8 kg (220 lb 0.3 oz)    Body mass index is 31.57 kg/m.  Estimated Creatinine Clearance: 50.9 mL/min (A) (by C-G formula based on SCr of 1.76 mg/dL (H)).   Based on [facility] policy patient is candidate for enoxaparin 0.5mg /kg TBW SQ every 24 hours based on BMI being >30.  DESCRIPTION: Pharmacy has adjusted enoxaparin dose per [facility] policy.  Patient is now receiving enoxaparin 0.5 mg/kg every 24 hours   Otelia Sergeant, PharmD, [facility] 04/26/2023 11:40 PM

## 2023-04-26 NOTE — Progress Notes (Signed)
Responded to code stroke paged out-no needs shared at this time. Chaplain services introduced. Staff is able to reach out should needs arise

## 2023-04-26 NOTE — ED Notes (Signed)
Teleneuro consult ended 66

## 2023-04-26 NOTE — Consult Note (Signed)
[facility] Telemedicine Consult   Requesting Provider: Dionne Bucy Consult Participants: Myself, bedside nurse,  Location of the provider: [facility] [place]  Location of the patient: [facility]  This consult was provided via telemedicine with 2-way video and audio communication. The patient/family was informed that care would be provided in this way and agreed to receive care in this manner.    Chief Complaint: slurred speech after syncope  HPI: Troy Morse is a 62 y.o. 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  He was evaluated at an ED but left prior to full evaluation due to dissatisfaction with wait times.  He was subsequently seen by his primary care physician who recommended an antiemetic and noted that the patient had stopped all of his medications for 4 to 5 days with concern that might be contributing to his symptoms  Today he had slept most of the day and got up to eat lasagna.  He suddenly slumped over and lost consciousness.  On arrival to the ED there was concern for slurred speech, with nursing concern for possible left-sided weakness, for which code stroke was activated.  Past Medical History:  Diagnosis Date   CRD (chronic renal disease)    Diabetes mellitus without complication (HCC)    GERD (gastroesophageal reflux disease)    Hypertension      LKW: 4:15 PM Thrombolytic given?: No, overall presentation more consistent with medical issues, nondisabling symptoms of slurred speech (too mild to treat) IR Thrombectomy? No, exam not consistent with LVO Modified Rankin Scale: 0-1 Time of teleneurologist evaluation: 7:20  PM  Exam: Vitals:   04/26/23 1906  BP: 112/74  Pulse: 93  Resp: 18  Temp: 98.3 F (36.8 C)  SpO2: 95%    General: Fatigued appearing Pulmonary: breathing comfortably Cardiac: regular rate and rhythm on monitor   NIH Stroke scale 1A: Level of Consciousness - 0 1B: Ask Month and Age - 0 1C: 'Blink Eyes' & 'Squeeze Hands' - 0 2: Test Horizontal Extraocular Movements - 0 3: Test Visual Fields - 0 4: Test Facial Palsy - 0 5A: Test Left Arm Motor Drift - 0 5B: Test Right Arm Motor Drift - 0 6A: Test Left Leg Motor Drift - 1 6B: Test Right Leg Motor Drift - 1 7: Test Limb Ataxia - 0 8: Test Sensation - 0 9: Test Language/Aphasia- 0 10: Test Dysarthria - 0 11: Test Extinction/Inattention - 0 NIHSS score: 2   Imaging Reviewed:   Head CT Age indeterminate, but likely chronic infarcts in the right occipital lobe and bilateral cerebellum. No hemorrhage. Recommend further evaluation with a brain MRI for more definitive characterization.  Labs reviewed in epic and pertinent values follow:  Basic Metabolic Panel: Recent Labs  Lab 04/26/23 1916  NA 133*  K 4.4  CL 98  CO2 26  GLUCOSE 251*  BUN 30*  CREATININE 1.76*  CALCIUM 8.7*   Baseline Cr 1.4  CBC: Recent Labs  Lab 04/26/23 1916  WBC 7.4  NEUTROABS 5.4  HGB 16.3  HCT 47.6  MCV 97.5  PLT 180    Coagulation Studies: Recent Labs    04/26/23 1916  LABPROT 13.6  INR 1.0       Assessment:  Slurred speech most likely due to dehydration/general medical condition.  Reasonable to obtain MRI brain to exclude any acute intracranial process  Recommendations:  - MRI brain - Standard stroke workup per orderset if positive for acute stroke (MRA head and carotid US for vessel imaging given renal function)  - Medical workup/clearance per ED provider   Brooke Dare MD-PhD [facility] [phone]   If 8pm-8am, please page neurology on call as listed in AMION.  CRITICAL CARE Performed by:  Gordy Councilman   Total critical care time: 30 minutes  Critical care time was exclusive of separately billable procedures and treating other patients.  Critical care was necessary to treat or prevent imminent or life-threatening deterioration; emergent evaluation for consideration of thrombectomy/thrombolytic  Critical care was time spent personally by me on the following activities: development of treatment plan with patient and/or surrogate as well as nursing, discussions with consultants, evaluation of patient's response to treatment, examination of patient, obtaining history from patient or surrogate, ordering and performing treatments and interventions, ordering and review of laboratory studies, ordering and review of radiographic studies, pulse oximetry and re-evaluation of patient's condition.

## 2023-04-27 ENCOUNTER — Inpatient Hospital Stay: Payer: Medicare Other

## 2023-04-27 ENCOUNTER — Encounter: Payer: Self-pay | Admitting: Family Medicine

## 2023-04-27 ENCOUNTER — Other Ambulatory Visit: Payer: Self-pay

## 2023-04-27 DIAGNOSIS — E1142 Type 2 diabetes mellitus with diabetic polyneuropathy: Secondary | ICD-10-CM

## 2023-04-27 DIAGNOSIS — I639 Cerebral infarction, unspecified: Secondary | ICD-10-CM | POA: Diagnosis not present

## 2023-04-27 DIAGNOSIS — I10 Essential (primary) hypertension: Secondary | ICD-10-CM

## 2023-04-27 DIAGNOSIS — E785 Hyperlipidemia, unspecified: Secondary | ICD-10-CM

## 2023-04-27 DIAGNOSIS — N179 Acute kidney failure, unspecified: Secondary | ICD-10-CM

## 2023-04-27 LAB — LACTIC ACID, PLASMA
Lactic Acid, Venous: 3.5 mmol/L (ref 0.5–1.9)
Lactic Acid, Venous: 2.4 mmol/L (ref 0.5–1.9)

## 2023-04-27 LAB — CBG MONITORING, ED
Glucose-Capillary: 197 mg/dL — ABNORMAL HIGH (ref 70–99)
Glucose-Capillary: 149 mg/dL — ABNORMAL HIGH (ref 70–99)
Glucose-Capillary: 165 mg/dL — ABNORMAL HIGH (ref 70–99)

## 2023-04-27 LAB — URINE DRUG SCREEN, QUALITATIVE (ARMC ONLY)
Amphetamines, Ur Screen: NOT DETECTED
Barbiturates, Ur Screen: POSITIVE — AB
Benzodiazepine, Ur Scrn: NOT DETECTED
Cannabinoid 50 Ng, Ur ~~LOC~~: NOT DETECTED
Cocaine Metabolite,Ur ~~LOC~~: NOT DETECTED
MDMA (Ecstasy)Ur Screen: NOT DETECTED
Methadone Scn, Ur: NOT DETECTED
Opiate, Ur Screen: NOT DETECTED
Phencyclidine (PCP) Ur S: NOT DETECTED
Tricyclic, Ur Screen: NOT DETECTED

## 2023-04-27 LAB — GLUCOSE, CAPILLARY
Glucose-Capillary: 184 mg/dL — ABNORMAL HIGH (ref 70–99)
Glucose-Capillary: 231 mg/dL — ABNORMAL HIGH (ref 70–99)
Glucose-Capillary: 236 mg/dL — ABNORMAL HIGH (ref 70–99)

## 2023-04-27 LAB — LIPID PANEL
Cholesterol: 216 mg/dL — ABNORMAL HIGH (ref 0–200)
HDL: 23 mg/dL — ABNORMAL LOW (ref >40)
LDL Cholesterol: UNDETERMINED mg/dL (ref 0–99)
Total CHOL/HDL Ratio: 9.4 {ratio}
Triglycerides: 548 mg/dL — ABNORMAL HIGH (ref <150)
VLDL: UNDETERMINED mg/dL (ref 0–40)

## 2023-04-27 LAB — LDL CHOLESTEROL, DIRECT: Direct LDL: 95 mg/dL (ref 0–99)

## 2023-04-27 LAB — HIV ANTIBODY (ROUTINE TESTING W REFLEX): HIV Screen 4th Generation wRfx: NONREACTIVE

## 2023-04-27 LAB — HEMOGLOBIN A1C
Hgb A1c MFr Bld: 8.1 % — ABNORMAL HIGH (ref 4.8–5.6)
Mean Plasma Glucose: 185.77 mg/dL

## 2023-04-27 MED ORDER — HYDRALAZINE HCL 20 MG/ML IJ SOLN
10.0000 mg | Freq: Four times a day (QID) | INTRAMUSCULAR | Status: DC | PRN
  Administered 2023-04-27: 10 mg via INTRAVENOUS
  Filled 2023-04-27: qty 1

## 2023-04-27 MED ORDER — CLOPIDOGREL BISULFATE 75 MG PO TABS
300.0000 mg | ORAL_TABLET | Freq: Once | ORAL | Status: AC
  Administered 2023-04-27: 300 mg via ORAL
  Filled 2023-04-27: qty 4

## 2023-04-27 MED ORDER — CLOPIDOGREL BISULFATE 75 MG PO TABS
75.0000 mg | ORAL_TABLET | Freq: Every day | ORAL | Status: DC
  Administered 2023-04-28 – 2023-04-29 (×2): 75 mg via ORAL
  Filled 2023-04-27 (×2): qty 1

## 2023-04-27 MED ORDER — PRIMIDONE 250 MG PO TABS
250.0000 mg | ORAL_TABLET | Freq: Every day | ORAL | Status: DC
  Administered 2023-04-27 – 2023-04-29 (×3): 250 mg via ORAL
  Filled 2023-04-27 (×3): qty 1

## 2023-04-27 NOTE — Assessment & Plan Note (Signed)
-   Continue antihypertensives with permissive parameters.

## 2023-04-27 NOTE — Assessment & Plan Note (Signed)
-   The patient will be on statin therapy and will check fasting lipids.

## 2023-04-27 NOTE — ED Notes (Signed)
20g piv to rac remains cdi secured not infiltrated @ this time

## 2023-04-27 NOTE — Progress Notes (Signed)
   04/27/23 0932  Assess: MEWS Score  Temp 97.8 F (36.6 C)  BP (!) 204/96  MAP (mmHg) 128  Pulse Rate 64  Resp 15  SpO2 97 %  O2 Device Room Air  Assess: MEWS Score  MEWS Temp 0  MEWS Systolic 2  MEWS Pulse 0  MEWS RR 0  MEWS LOC 0  MEWS Score 2  MEWS Score Color Yellow  Assess: if the MEWS score is Yellow or Red  Were vital signs accurate and taken at a resting state? Yes  Does the patient meet 2 or more of the SIRS criteria? No  MEWS guidelines implemented  Yes, yellow  Treat  MEWS Interventions Considered administering scheduled or prn medications/treatments as ordered  Take Vital Signs  Increase Vital Sign Frequency  Yellow: Q2hr x1, continue Q4hrs until patient remains green for 12hrs  Escalate  MEWS: Escalate Yellow: Discuss with charge nurse and consider notifying provider and/or RRT  Notify: Charge Nurse/RN  Name of Charge Nurse/RN Notified Brandi  Provider Notification  Provider Joella Prince MD  Date Provider Notified 04/27/23  Time Provider Notified 1005  Method of Notification Page  Notification Reason Other (Comment) (MEWS)  Provider response See new orders (hydralazine prn)  Date of Provider Response 04/27/23  Time of Provider Response 1209  Assess: SIRS CRITERIA  SIRS Temperature  0  SIRS Pulse 0  SIRS Respirations  0  SIRS WBC 0  SIRS Score Sum  0

## 2023-04-27 NOTE — Assessment & Plan Note (Signed)
-   The will be placed on supplemental coverage with NovoLog. - We will hold off Glucophage. - We will continue Jardiance and Neurontin.

## 2023-04-27 NOTE — Progress Notes (Addendum)
SLP Cancellation Note  Patient Details Name: [name] MRN: [id number] DOB: 11-20-1960   Cancelled treatment:       Reason Eval/Treat Not Completed: SLP screened, no needs identified, will sign off (chart reviewed; consulted NSG and met w/ pt in room.)  Pt recently transferred from the ED to new room. NSG reported that pt answered all questions re: self and followed commands. Noted MRI results:  "1. 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 2. Underlying age-related cerebral atrophy with moderately advanced chronic microvascular ischemic disease, with a few scattered remote lacunar infarcts involving the bilateral thalami, pons, and cerebellum. 3. Small chronic bilateral PCA distribution infarcts, right larger than left.".  Unsure of pt's Baseline Cognitive functioning -- pt stated Wife manages his Pills for him at home.   Upon meeting pt, he denied any difficulty swallowing and tolerates swallowing pills w/ water per pt/NSG. Pt is currently on a regular diet -- he was finishing his lunch meal w/ sweet potato. Pt conversed in general conversation about issues w/ his walking balance d/t having toes amputated; no overt expressive/receptive deficits noted. Pt denied any speech-language deficits. Speech fully intelligible, clear.  Gave general information on standard aspiration precautions including sitting upright (in bed) for meals and swallowing Pills carefully w/ liquids, or can use applesauce. No further acute skilled ST services indicated as pt appears at a functional baseline. Encouraged him to f/u w/ his PCP for Outpatient ST services if needs indicated post D/C to home environment. Pt agreed. NSG to reconsult if any change in status while admitted.     Jerilynn Som, MS, CCC-SLP Speech Language Pathologist Rehab Services; [facility] [phone] (ascom) Rashad Auld 04/27/2023,  1:57 PM

## 2023-04-27 NOTE — Assessment & Plan Note (Signed)
-   This is superimposed on stage IIIa chronic kidney disease. - This is likely prerenal. - The patient will be hydrated with IV normal saline and will follow BMP. - We will avoid nephrotoxins.

## 2023-04-27 NOTE — Progress Notes (Signed)
PHARMACIST - PHYSICIAN ORDER COMMUNICATION  CONCERNING: P&T Medication Policy on Herbal Medications  DESCRIPTION:  This patient's order(s) for: Alpha-Lipoic Acid CAPS 200 mg has been noted.  This product(s) is classified as an "herbal" or natural product. Due to a lack of definitive safety studies or FDA approval, nonstandard manufacturing practices, plus the potential risk of unknown drug-drug interactions while on inpatient medications, the Pharmacy and Therapeutics Committee does not permit the use of "herbal" or natural products of this type within [facility].   ACTION TAKEN: The pharmacy department is unable to verify this order at this time.  Please reevaluate patient's clinical condition at discharge and address if the herbal or natural product(s) should be resumed at that time.  Otelia Sergeant, PharmD, MBA 04/27/2023 1:40 AM

## 2023-04-27 NOTE — Progress Notes (Signed)
Patient is seen and examined today morning.  He is admitted for acute ischemic stroke workup.  Brain MRI showed 1 cm acute ischemic nonhemorrhagic pontine infarct.  Patient is seen by neurologist who advised MRI/ MRA brain.  He is able to swallow, started on diet.  Patient's blood pressure is elevated, started IV hydralazine for SBP greater than 180.  PT/ OT evaluation ordered.  Further management as per clinical course.

## 2023-04-27 NOTE — ED Notes (Signed)
Pt remains oriented x4 pt resting in room 34 appears in nad denies headache speech remains slightly slurred unchanged neuro check

## 2023-04-27 NOTE — ED Notes (Signed)
Pt remains aox4 speech slightly slurred moving all extremities on hospital bed without apparent difficulty pt swallowing water without apparent difficulty pt denies needs

## 2023-04-27 NOTE — Progress Notes (Signed)
NEUROLOGY CONSULT FOLLOW UP NOTE   Date of service: April 27, 2023 Patient Name: Troy Morse MRN:  [id number] DOB:  1960-12-23  Brief HPI  Troy Morse is a 62 y.o. male  has a past medical history of CRD (chronic renal disease), Diabetes mellitus without complication (HCC), GERD (gastroesophageal reflux disease), and Hypertension. who presented with a syncopal event   Interval Hx/subjective   -No acute complaints -Reports statin intolerance  Vitals   Current vital signs: BP (!) 214/108 (BP Location: Left Arm)   Pulse 68   Temp 98.6 F (37 C)   Resp 14   Ht 5\' 10"  (1.778 m)   Wt 99.8 kg   SpO2 96%   BMI 31.57 kg/m  Vital signs in last 24 hours: Temp:  [97.7 F (36.5 C)-98.6 F (37 C)] 98.6 F (37 C) (11/15 1203) Pulse Rate:  [59-93] 68 (11/15 1203) Resp:  [9-22] 14 (11/15 1203) BP: (112-214)/(70-108) 214/108 (11/15 1203) SpO2:  [95 %-100 %] 96 % (11/15 1203) Weight:  [99.8 kg] 99.8 kg (11/14 1910)   Body mass index is 31.57 kg/m.  Physical Exam   Constitutional: Appears less fatigued than yesterday Psych: Affect appropriate to situation, slightly flat Eyes: No scleral injection. HENT: No OP obstruction.  Head: Normocephalic.  Cardiovascular: Perfusing extremities well Respiratory: Effort normal, non-labored breathing.  GI: Soft.  No distension. There is no tenderness.  Skin: WDI, chronic skin changes of the lower extremities  Neurologic Examination    Neuro: Mental Status: Patient is awake, alert, oriented to person, place, month, year, and situation. Patient is able to give a clear and coherent history, but does not know significant details about his medication history as his wife manages most of his medications No signs of aphasia or neglect.  Minimal dysarthria Cranial Nerves: II: Visual Fields are full.  Left pupil is 2 mm and essentially fixed.  Right pupil is normally reactive 4 mm to 2 mm III,IV, VI: EOMI without ptosis or diploplia.   V: Facial sensation is symmetric to light touch VII: Facial movement is symmetric.  VIII: hearing is intact to voice X: Uvula elevates symmetrically XI: Shoulder shrug is symmetric. XII: tongue is midline without atrophy or fasciculations.  Motor: Tone is normal. Bulk is normal. 5/5 strength was present in all four extremities; although requires repeated encouragement to give good effort at times Sensory: Sensation is symmetric to light touch and temperature in the arms and legs. Deep Tendon Reflexes: 2+ and symmetric in the biceps and patellae.  Cerebellar: FNF and HKS are intact bilaterally Gait: Stands up with a wide-based posture, notes some limitation secondary to amputated toes.  Ambulates with a casual steady gait to bathroom without a walker  NIHSS total 1 Score breakdown: Mild dysarthria   Labs and Diagnostic Imaging    Basic Metabolic Panel: Recent Labs  Lab 04/26/23 1916  NA 133*  K 4.4  CL 98  CO2 26  GLUCOSE 251*  BUN 30*  CREATININE 1.76*  CALCIUM 8.7*   Baseline Cr 1.47  CBC: Recent Labs  Lab 04/26/23 1916  WBC 7.4  NEUTROABS 5.4  HGB 16.3  HCT 47.6  MCV 97.5  PLT 180    Coagulation Studies: Recent Labs    04/26/23 1916  LABPROT 13.6  INR 1.0      Lipid Panel:  Lab Results  Component Value Date   CHOL 216 (H) 04/27/2023   HDL 23 (L) 04/27/2023   LDLCALC UNABLE TO CALCULATE IF TRIGLYCERIDE OVER  400 mg/dL 46/96/2952   LDLDIRECT 95 04/27/2023   TRIG 548 (H) 04/27/2023   CHOLHDL 9.4 04/27/2023     HgbA1c:  Lab Results  Component Value Date   HGBA1C 8.1 (H) 04/27/2023   Urine Drug Screen:     Component Value Date/Time   LABOPIA NONE DETECTED 04/26/2023 1945   COCAINSCRNUR NONE DETECTED 04/26/2023 1945   LABBENZ NONE DETECTED 04/26/2023 1945   AMPHETMU NONE DETECTED 04/26/2023 1945   THCU NONE DETECTED 04/26/2023 1945   LABBARB POSITIVE (A) 04/26/2023 1945    Alcohol Level     Component Value Date/Time   ETH <10  04/26/2023 1916   INR  Lab Results  Component Value Date   INR 1.0 04/26/2023   APTT  Lab Results  Component Value Date   APTT 26 04/26/2023    Latest Reference Range & Units 04/26/23 19:47 04/26/23 21:46 04/27/23 01:29 04/27/23 04:50  Lactic Acid, Venous 0.5 - 1.9 mmol/L 3.0 (HH) 3.4 (HH) 3.5 (HH) 2.4 (HH)  (HH): Data is critically high   Latest Reference Range & Units 04/26/23 19:47 04/26/23 21:46  Troponin I (High Sensitivity) <18 ng/L 8 8     MR Angio head without contrast and Carotid Duplex BL(pending): 1. Left AICA is not well visualized, which may be due to technique, anatomic variation, or occlusion. 2. Otherwise, unremarkable MRA of the head.  MRI Brain(Personally reviewed): 1. 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 2. Underlying age-related cerebral atrophy with moderately advanced chronic microvascular ischemic disease, with a few scattered remote lacunar infarcts involving the bilateral thalami, pons, and cerebellum. 3. Small chronic bilateral PCA distribution infarcts, right larger than left.   Impression   Troy Morse is a 62 y.o. male presenting with an acute episode of syncope after which his speech was slurred for which code stroke was activated.  There was also some concern for possible left sided weakness  Left pontine stroke typically small vessel disease by location; given simultaneous right cerebellar punctate lesion, embolic is on the differential vs. simultaneous small vessel disease  While the triage nurse and admission H&P noted some left-sided weakness this may have been due to variable/poor effort as the left pontine stroke would be expected to create right sided weakness if any focal weakness.  On my evaluation he did perhaps have some mild right hip flexion weakness but again very variable effort on motor testing makes this difficult to be certain  of.  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 Will need repeat outpatient as general lipid profile is improved to allow LDL evaluation. Goal < 70 - UDS added on for stroke workup, positive for primidone which she takes for essential tremor - MRA head ordered and reviewed, agree with radiology report - Frequent neuro checks - Echocardiogram pending - Carotid ultrasound pending - Aspirin 81 mg daily - Plavix 300 mg load with 75 mg daily for 21 days - Risk factor modification - Telemetry monitoring; 30 day event monitor on discharge if no arrythmias captured  - Blood pressure goal   - Permissive hypertension to 220/120 due to acute stroke today, may start to gradually normalize blood pressure tomorrow - PT consult, OT consult pending, SLP notes patient is at baseline - I will follow along for carotid ultrasound and echocardiogram  results, otherwise will be available as needed going forward.  Please do not hesitate to reach out if additional questions or concerns arise  # Fatigue, syncope - Consider dose reduction of gabapentin or primidone  - Appreciate medical workup per primary team   ______________________________________________________________________   Thank you for the opportunity to take part in the care of this patient. If you have any further questions, please contact the neurology consultation team on call. Updated oncall schedule is listed on AMION.  Nunzio Cory MD-PhD [facility] [phone]

## 2023-04-27 NOTE — Assessment & Plan Note (Addendum)
-   This is ischemic infarct involving the left dorsal pons and right cerebellum. - The patient will be admitted to a medically monitored bed. - This is manifested by left-sided weakness and dysarthria as well as unsteady gait. - We will follow neuro checks q.4 hours for 24 hours.   - The patient will be placed on aspirin and added Plavix.   - Will obtain a bilateral carotid Doppler and 2D echo with bubble study .   - A neurology consultation  as well as physical/occupation/speech therapy consults will be obtained in a.m.Marland Kitchen - I notified Dr. Iver Nestle about the patient. - The patient will be placed on statin therapy and fasting lipids will be checked.

## 2023-04-27 NOTE — H&P (Addendum)
[facility]   PATIENT NAME: Nermin Bielak    MR#:  [id number]  DATE OF BIRTH:  [birth year]  DATE OF ADMISSION:  04/26/2023  PRIMARY CARE PHYSICIAN: Mickey Farber, MD   Patient is coming from: Home  REQUESTING/REFERRING PHYSICIAN: Miki Kins, MD  CHIEF COMPLAINT:   Chief Complaint  Patient presents with   Loss of Consciousness    HISTORY OF PRESENT ILLNESS:  BRIJESH VITE is a 62 y.o. male with medical history significant for GERD, type diabetes mellitus and hypertension, who presented to the emergency room with acute onset of suspected syncope with slurred speech and generalized weakness that was mostly noted on the left side concerning for CVA.  He was recently having nausea and vomiting apparently left AGAINST MEDICAL ADVICE from [facility] [place].  He was noted to have unsteady gait.  He denied any dysphagia.  He has been having diminished light sensation on the left side.  No fever or chills.  No chest pain or palpitations.  No cough or wheezing or dyspnea.  No tinnitus or vertigo.  ED Course: When he came to the ER, vital signs were within normal.  Labs revealed mild hyponatremia 133 and a blood Kos 251, BUN of 30 with a creatinine 1.76 and calcium 8.7.  Serum lipase was 44.  CBC was within normal.  Lactic acid was 3.4 and later 3.5 then 2.4.  Hemoglobin A1c came back at 8.1.  UA showed more than 500 glucose and was otherwise negative. EKG as reviewed by me : EKG normal sinus rhythm with rate of 82 with incomplete right bundle branch block and probable left ventricular hypertrophy. Imaging: Code stroke head CT scan revealed age-indeterminate but likely chronic infarcts in the right occipital lobe and bilateral cerebellum with no acute findings.  Brain MRI revealed the following: 1. 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 2. Underlying age-related cerebral atrophy with  moderately advanced chronic microvascular ischemic disease, with a few scattered remote lacunar infarcts involving the bilateral thalami, pons, and cerebellum. 3. Small chronic bilateral PCA distribution infarcts, right larger than left.  The patient was given 1 L bolus of IV normal saline and 1 mg of IV Ativan and added Plavix to his aspirin.  Teleneurology consult was obtained.  The patient will be admitted to a medical telemetry Bed for further evaluation and management. PAST MEDICAL HISTORY:   Past Medical History:  Diagnosis Date   CRD (chronic renal disease)    Diabetes mellitus without complication (HCC)    GERD (gastroesophageal reflux disease)    Hypertension     PAST SURGICAL HISTORY:   Past Surgical History:  Procedure Laterality Date   BACK SURGERY     BONE EXCISION Right 03/21/2019   Procedure: PART EXCISION BONE-PHALANX RIGHT GREAT TOE;  Surgeon: Gwyneth Revels, DPM;  Location: [facility] ORS;  Service: Podiatry;  Laterality: Right;   CHOLECYSTECTOMY     EYE SURGERY     torn retina   HERNIA REPAIR     INCISION AND DRAINAGE Right 12/13/2018   Procedure: INCISION AND DRAINAGE GREAT RIGHT TOE;  Surgeon: Gwyneth Revels, DPM;  Location: [facility] ORS;  Service: Podiatry;  Laterality: Right;    SOCIAL HISTORY:   Social History   Tobacco Use   Smoking status: Never   Smokeless tobacco: Never  Substance Use Topics   Alcohol use: Not Currently    FAMILY HISTORY:   Family History  Problem  Relation Age of Onset   COPD Mother    Heart disease Father    COPD Father     DRUG ALLERGIES:   Allergies  Allergen Reactions   Empagliflozin Other (See Comments)    Pancreatitis    Liraglutide Other (See Comments)    Pancreatitis ALSO KNOWN AS VICTOZA     REVIEW OF SYSTEMS:   ROS As per history of present illness. All pertinent systems were reviewed above. Constitutional, HEENT, cardiovascular, respiratory, GI, GU, musculoskeletal, neuro, psychiatric, endocrine,  integumentary and hematologic systems were reviewed and are otherwise negative/unremarkable except for positive findings mentioned above in the HPI.   MEDICATIONS AT HOME:   Prior to Admission medications   Medication Sig Start Date End Date Taking? Authorizing Provider  Alpha-Lipoic Acid 200 MG CAPS Take 1 capsule by mouth daily.   Yes [provider]  aspirin EC 81 MG tablet Take 81 mg by mouth at bedtime.    Yes [provider]  brimonidine (ALPHAGAN) 0.2 % ophthalmic solution Place 1 drop into both eyes 2 (two) times a day.   Yes [provider]  empagliflozin (JARDIANCE) 10 MG TABS tablet Take 25 mg by mouth daily. 11/29/20  Yes [provider]  gabapentin (NEURONTIN) 300 MG capsule Take 300 mg by mouth every evening. 04/26/18  Yes [provider]  icosapent Ethyl (VASCEPA) 1 g capsule Take 2 g by mouth daily. 12/11/20  Yes [provider]  latanoprost (XALATAN) 0.005 % ophthalmic solution Place 1 drop into both eyes at bedtime. 04/26/18  Yes [provider]  Melatonin 5 MG CHEW Chew 5 mg by mouth at bedtime.   Yes [provider]  metFORMIN (GLUCOPHAGE) 500 MG tablet Take 1,000 mg by mouth at bedtime.    Yes [provider]  NOVOLOG FLEXPEN 100 UNIT/ML FlexPen Inject 0-14 Units into the skin See admin instructions. Inject under the skin 3 times daily at mealtimes according to sliding scale:  100-150: 0u 151-200: 5u 201-250: 8u 251-300: 10u 301-350: 12u >351: 14u 04/26/18  Yes [provider]  pravastatin (PRAVACHOL) 80 MG tablet Take 80 mg by mouth at bedtime.  12/04/17  Yes [provider]  sertraline (ZOLOFT) 100 MG tablet Take 100 mg by mouth at bedtime.  11/06/17  Yes [provider]  TRESIBA FLEXTOUCH 200 UNIT/ML SOPN Inject 88-98 Units into the skin every evening.    Yes [provider]  levofloxacin (LEVAQUIN) 250 MG tablet Take 1 tablet (250 mg total) by mouth  daily. Patient not taking: Reported on 04/26/2023 03/27/19   Lynn Ito, MD  levofloxacin (LEVAQUIN) 500 MG tablet Take 1 tablet by mouth daily. Patient not taking: Reported on 04/26/2023 03/24/19   [provider]  lisinopril (PRINIVIL,ZESTRIL) 20 MG tablet Take 10 mg by mouth daily. Patient not taking: Reported on 04/26/2023 08/27/17   [provider]  NONFORMULARY OR COMPOUNDED ITEM Trimix (30/1/10)-(Pap/Phent/PGE)  Dosage: Inject 1ml per injection  Prefilled Syringe # Test Dose 3ml vial Vial 5ml   Qty 5ml Refills 3  [facility] [phone] Fax [phone] 10/01/18   Riki Altes, MD      VITAL SIGNS:  Blood pressure 119/75, pulse 74, temperature 98.4 F (36.9 C), temperature source Oral, resp. rate 15, height 5\' 10"  (1.778 m), weight 99.8 kg, SpO2 95%.  PHYSICAL EXAMINATION:  Physical Exam  GENERAL:  62 y.o.-year-old patient lying in the bed with no acute distress.  EYES: Pupils equal, round, reactive to light and accommodation. No  scleral icterus. Extraocular muscles intact.  HEENT: Head atraumatic, normocephalic. Oropharynx and nasopharynx clear.  NECK:  Supple, no jugular venous distention. No thyroid enlargement, no tenderness.  LUNGS: Normal breath sounds bilaterally, no wheezing, rales,rhonchi or crepitation. No use of accessory muscles of respiration.  CARDIOVASCULAR: Regular rate and rhythm, S1, S2 normal. No murmurs, rubs, or gallops.  ABDOMEN: Soft, nondistended, nontender. Bowel sounds present. No organomegaly or mass.  EXTREMITIES: No pedal edema, cyanosis, or clubbing.  NEUROLOGIC: Cranial nerves II through XII are intact. Muscle strength 5/5 in the right upper and lower extremities and 3-4/5 in the left upper and lower extremities with decreased sensation to light touch on the left arm and leg.  Gait not checked.  PSYCHIATRIC: The patient is alert and oriented x 3.  Normal affect and good eye contact. SKIN: No obvious  rash, lesion, or ulcer.   LABORATORY PANEL:   CBC Recent Labs  Lab 04/26/23 1916  WBC 7.4  HGB 16.3  HCT 47.6  PLT 180   ------------------------------------------------------------------------------------------------------------------  Chemistries  Recent Labs  Lab 04/26/23 1916  NA 133*  K 4.4  CL 98  CO2 26  GLUCOSE 251*  BUN 30*  CREATININE 1.76*  CALCIUM 8.7*  AST 25  ALT 30  ALKPHOS 82  BILITOT 0.7   ------------------------------------------------------------------------------------------------------------------  Cardiac Enzymes No results for input(s): "TROPONINI" in the last 168 hours. ------------------------------------------------------------------------------------------------------------------  RADIOLOGY:  MR BRAIN WO CONTRAST  Result Date: 04/26/2023 CLINICAL DATA:  Initial evaluation for neuro deficit, stroke suspected. EXAM: MRI HEAD WITHOUT CONTRAST TECHNIQUE: Multiplanar, multiecho pulse sequences of the brain and surrounding structures were obtained without intravenous contrast. COMPARISON:  CT from earlier the same day. FINDINGS: Brain: Generalized age-related cerebral atrophy. Patchy and confluent T2/FLAIR hyperintensity involving the periventricular deep white matter both cerebral hemispheres. Patchy involvement of the thalami, pons, and cerebellum. Findings consistent with chronic small vessel ischemic disease, moderately advanced in nature. Few scatter remote lacunar infarcts present about the bilateral thalami and pons. Few scattered small remote bilateral cerebellar infarcts. Small chronic bilateral PCA distribution infarcts, right larger than left. 1 cm linear focus of restricted diffusion involving the left dorsal pons, consistent with an acute ischemic nonhemorrhagic infarct (series 5, image 72). Additional punctate acute ischemic nonhemorrhagic infarct noted within the right cerebellum as well (series 5, image 69). Gray-white matter  differentiation otherwise maintained. No acute intracranial hemorrhage. Few chronic micro hemorrhages noted at the right cerebellum and brainstem, likely hypertensive in nature. No mass lesion, midline shift or mass effect. No hydrocephalus or extra-axial fluid collection. Pituitary gland suprasellar region within normal limits. Vascular: Major intracranial vascular flow voids are maintained. Skull and upper cervical spine: Cranial junctional limits. Bone marrow signal intensity normal. No scalp soft tissue abnormality. Sinuses/Orbits: Prior ocular lens replacement on the left. Paranasal sinuses are clear. No significant mastoid effusion. Other: None. IMPRESSION: 1. 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 2. Underlying age-related cerebral atrophy with moderately advanced chronic microvascular ischemic disease, with a few scattered remote lacunar infarcts involving the bilateral thalami, pons, and cerebellum. 3. Small chronic bilateral PCA distribution infarcts, right larger than left. Electronically Signed   By: Rise Mu M.D.   On: 04/26/2023 21:59   CT HEAD CODE STROKE WO CONTRAST  Result Date: 04/26/2023 CLINICAL DATA:  Code stroke.  Altered mental status.  Syncope. EXAM: CT HEAD WITHOUT CONTRAST TECHNIQUE: Contiguous axial images were obtained from the base of the skull through the vertex  without intravenous contrast. RADIATION DOSE REDUCTION: This exam was performed according to the departmental dose-optimization program which includes automated exposure control, adjustment of the mA and/or kV according to patient size and/or use of iterative reconstruction technique. COMPARISON:  None Available. FINDINGS: Brain: No hemorrhage. No hydrocephalus. No extra-axial fluid collection. Or mass effect. No mass lesion. There is a age indeterminate, but likely chronic infarct in the right occipital lobe. There has  an age indeterminate, but likely chronic infarct in the left cerebellum. Vascular: No hyperdense vessel or unexpected calcification. Skull: Normal. Negative for fracture or focal lesion. Sinuses/Orbits: No middle ear mastoid effusion. Paranasal sinuses are clear. Left lens replacement. Orbits are otherwise unremarkable. Other: None. ASPECTS [facility] Stroke Program Early CT Score): 10 IMPRESSION: Age indeterminate, but likely chronic infarcts in the right occipital lobe and bilateral cerebellum. No hemorrhage. Recommend further evaluation with a brain MRI for more definitive characterization. Findings were discussed with Dr. Marisa Severin on 04/26/23 at 7:27 PM. Electronically Signed   By: Lorenza Cambridge M.D.   On: 04/26/2023 19:27      IMPRESSION AND PLAN:  Assessment and Plan: * Acute CVA (cerebrovascular accident) (HCC) - This is ischemic infarct involving the left dorsal pons and right cerebellum. - The patient will be admitted to a medically monitored bed. - This is manifested by left-sided weakness and dysarthria as well as unsteady gait. - We will follow neuro checks q.4 hours for 24 hours.   - The patient will be placed on aspirin and added Plavix.   - Will obtain a bilateral carotid Doppler and 2D echo with bubble study .   - A neurology consultation  as well as physical/occupation/speech therapy consults will be obtained in a.m.Marland Kitchen - I notified Dr. Iver Nestle about the patient. - The patient will be placed on statin therapy and fasting lipids will be checked.   Acute kidney injury superimposed on chronic kidney disease (HCC) - This is superimposed on stage IIIa chronic kidney disease. - This is likely prerenal. - The patient will be hydrated with IV normal saline and will follow BMP. - We will avoid nephrotoxins.  Type 2 diabetes mellitus with peripheral neuropathy (HCC) - The will be placed on supplemental coverage with NovoLog. - We will hold off Glucophage. - We will continue Jardiance and  Neurontin.  Dyslipidemia - The patient will be on statin therapy and will check fasting lipids.  Essential hypertension - Continue antihypertensives with permissive parameters.       DVT prophylaxis: Lovenox.  Advanced Care Planning:  Code Status: full code.  Family Communication:  The plan of care was discussed in details with the patient (and family). I answered all questions. The patient agreed to proceed with the above mentioned plan. Further management will depend upon hospital course. Disposition Plan: Back to previous home environment Consults called: Neurology All the records are reviewed and case discussed with ED provider.  Status is: Inpatient    At the time of the admission, it appears that the appropriate admission status for this patient is inpatient.  This is judged to be reasonable and necessary in order to provide the required intensity of service to ensure the patient's safety given the presenting symptoms, physical exam findings and initial radiographic and laboratory data in the context of comorbid conditions.  The patient requires inpatient status due to high intensity of service, high risk of further deterioration and high frequency of surveillance required.  I certify that at the time of admission, it is my clinical judgment  that the patient will require inpatient hospital care extending more than 2 midnights.                            Dispo: The patient is from: Home              Anticipated d/c is to: Home              Patient currently is not medically stable to d/c.              Difficult to place patient: No  Hannah Beat M.D on 04/27/2023 at 5:45 AM  [facility]   From 7 PM-7 AM, contact night-coverage [email or web address]  CC: Primary care physician; Mickey Farber, MD

## 2023-04-27 NOTE — Evaluation (Signed)
Physical Therapy Evaluation Patient Details Name: Troy Morse MRN: [id number] DOB: 10/03/1960 Today's Date: 04/27/2023  History of Present Illness  Pt is a 62 yo male who presented to the emergency room with acute onset of suspected syncope with slurred speech and generalized weakness that was mostly noted on the left side concerning for CVA. PMH includes GERD, type 2 diabetes mellitus, chronic renal disease, hypertension, R great toe excision, and previous back surgery.   Clinical Impression  Pt alert and oriented to person and place, able to answer subjective questions, but no family present to determine if his cognition is at baseline. Pt lives in a 2-story house with 1 STE at the back and 5 STE in the front, he reports being able to live on the first floor, and he reports that he is able to perform mobility on his own (has had trouble with walking ever since he lost his toes). Per wife's report and chart review, pt uses a manual WC for community distances and requires assist for ADL/IADLs. Upon entry, pt found leaning against the bathroom wall donning his pants, PT provided minA for amb to the sink and back to bed, pt very unsteady and reaching for furniture t/o. BP sitting EOB 147/94. Pt mod I with bed mobility and good sitting balance. Deficits include BLE fine motor deficits, RLE proprioception deficits, and balance impairments. Pt receptive to trialing amb with RW; noted increase in stability and only required CGA. Pt educated on importance of using RW to maximize safety and calling for help when needing to use the bathroom. Lengthy discussion regarding d/c options and need for therapy to help address current functional limitations. Pt left with all needs met and BP 185/93 in supine. Pt would benefit from continued skilled therapy to progress toward mobility goals.       If plan is discharge home, recommend the following: A little help with walking and/or transfers;A little help with  bathing/dressing/bathroom;Help with stairs or ramp for entrance;Assist for transportation;Supervision due to cognitive status;Assistance with cooking/housework;Direct supervision/assist for medications management;Direct supervision/assist for financial management   Can travel by private vehicle    yes    Equipment Recommendations None recommended by PT  Recommendations for Other Services       Functional Status Assessment Patient has had a recent decline in their functional status and demonstrates the ability to make significant improvements in function in a reasonable and predictable amount of time.     Precautions / Restrictions Precautions Precautions: Fall Restrictions Weight Bearing Restrictions: No      Mobility  Bed Mobility Overal bed mobility: Modified Independent Bed Mobility: Supine to Sit, Sit to Supine                Transfers Overall transfer level: Needs assistance Equipment used: None, Rolling walker (2 wheels) Transfers: Sit to/from Stand Sit to Stand: Contact guard assist           General transfer comment: Pt standing in bathroom upon entry without AD, minA to sit EOB. CGA for STS from bed with RW    Ambulation/Gait Ambulation/Gait assistance: Min assist, Contact guard assist Gait Distance (Feet): 50 Feet (20', 30') Assistive device: Rolling walker (2 wheels), None Gait Pattern/deviations: Staggering left, Staggering right, Step-through pattern       General Gait Details: Pt required minA to walk from bathroom to bed 2/2 staggering gait and significant unsteadiness, pt reaching for furniture; CGA for amb with RW  Stairs  Wheelchair Mobility     Tilt Bed    Modified Rankin (Stroke Patients Only)       Balance Overall balance assessment: Needs assistance Sitting-balance support: Feet supported, No upper extremity supported Sitting balance-Leahy Scale: Good     Standing balance support: During functional  activity, No upper extremity supported, Bilateral upper extremity supported, Reliant on assistive device for balance Standing balance-Leahy Scale: Fair Standing balance comment: poor dynamic standing balance without RW; good dynamic standing balance with RW                             Pertinent Vitals/Pain Pain Assessment Pain Assessment: No/denies pain    Home Living Family/patient expects to be discharged to:: Private residence Living Arrangements: Spouse/significant other Available Help at Discharge: Family;Available 24 hours/day Type of Home: House Home Access: Stairs to enter Entrance Stairs-Rails: Can reach both Entrance Stairs-Number of Steps: threshold through the back; 5 STE front Alternate Level Stairs-Number of Steps: flight upstairs but does not go upstairs Home Layout: Multi-level;Able to live on main level with bedroom/bathroom Home Equipment: Grab bars - toilet;Grab bars - tub/shower;Rollator (4 wheels);Shower seat;Wheelchair - manual      Prior Function               Mobility Comments: pt reports he does not need any help walking around; wife reports that she pushes him in a manual WC for community distances ADLs Comments: pt reports MOD I in ADL however upon further exploration, pt reports wife helps with IADLs     Extremity/Trunk Assessment   Upper Extremity Assessment Upper Extremity Assessment: Defer to OT evaluation LUE Deficits / Details: Tremor noted, pt reports chronic    Lower Extremity Assessment Lower Extremity Assessment: RLE deficits/detail;LLE deficits/detail RLE Deficits / Details: Multi toe amputations; grossly 4+/5 MMT; impaired proprioception RLE Sensation: WNL RLE Coordination: decreased fine motor LLE Deficits / Details: grossly 4+/5 MMT LLE Sensation: WNL LLE Coordination: decreased fine motor    Cervical / Trunk Assessment Cervical / Trunk Assessment: Normal  Communication   Communication Communication: No  apparent difficulties Cueing Techniques: Verbal cues  Cognition Arousal: Alert Behavior During Therapy: WFL for tasks assessed/performed Overall Cognitive Status: No family/caregiver present to determine baseline cognitive functioning Area of Impairment: Orientation, Following commands, Safety/judgement, Problem solving                 Orientation Level: Disoriented to, Time     Following Commands: Follows multi-step commands with increased time Safety/Judgement: Decreased awareness of safety, Decreased awareness of deficits   Problem Solving: Slow processing, Decreased initiation          General Comments      Exercises     Assessment/Plan    PT Assessment Patient needs continued PT services  PT Problem List Decreased strength;Decreased mobility;Decreased safety awareness;Decreased range of motion;Decreased coordination;Decreased activity tolerance;Decreased balance;Decreased knowledge of use of DME       PT Treatment Interventions DME instruction;Therapeutic exercise;Gait training;Balance training;Stair training;Functional mobility training;Neuromuscular re-education;Therapeutic activities;Patient/family education    PT Goals (Current goals can be found in the Care Plan section)  Acute Rehab PT Goals Patient Stated Goal: to go home PT Goal Formulation: With patient Time For Goal Achievement: 05/11/23 Potential to Achieve Goals: Good    Frequency Min 1X/week     Co-evaluation               AM-PAC PT "6 Clicks" Mobility  Outcome Measure Help needed turning  from your back to your side while in a flat bed without using bedrails?: None Help needed moving from lying on your back to sitting on the side of a flat bed without using bedrails?: None Help needed moving to and from a bed to a chair (including a wheelchair)?: A Little Help needed standing up from a chair using your arms (e.g., wheelchair or bedside chair)?: A Little Help needed to walk in hospital  room?: A Little Help needed climbing 3-5 steps with a railing? : A Lot 6 Click Score: 19    End of Session Equipment Utilized During Treatment: Gait belt Activity Tolerance: Patient tolerated treatment well;No increased pain Patient left: in bed;with call bell/phone within reach;with bed alarm set Nurse Communication: Mobility status and blood pressure PT Visit Diagnosis: Unsteadiness on feet (R26.81);Other abnormalities of gait and mobility (R26.89)    Time: 1430-1502 PT Time Calculation (min) (ACUTE ONLY): 32 min   Charges:   PT Evaluation $PT Eval Low Complexity: 1 Low PT Treatments $Gait Training: 8-22 mins PT General Charges $$ ACUTE PT VISIT: 1 Visit          Shauna Hugh, SPT 04/27/2023, 3:52 PM

## 2023-04-27 NOTE — Evaluation (Signed)
Occupational Therapy Evaluation Patient Details Name: Troy Morse MRN: [id number] DOB: [birth year] Today's Date: 04/27/2023   History of Present Illness Pt is a 62 yo male who presented to the emergency room with acute onset of suspected syncope with slurred speech and generalized weakness that was mostly noted on the left side concerning for CVA. PMH includes GERD, type 2 diabetes mellitus, chronic renal disease, hypertension, R great toe excision, and previous back surgery.   Clinical Impression   Pt was seen for OT evaluation this date. Pt was alert and oriented x2, ?situational awarness. Pt lives in house with his wife, in multi level home, but is able to live on main level. Prior to hospital admission, pt was amb community distance with MWC, MOD I in ADL however, upon further exploration, pt wife reports physical assistance as needed, for both ADLs/IADLs, utilizes RW at time within the home, driving short distances only. Pt presents to acute OT demonstrating impaired ADL performance and functional mobility 2/2 (See OT problem list for additional functional deficits). Pt currently requires supervision for bed mobility, with use of bed rails for sit<>supine and scooting himself up in bed. Pt requires CGA for STS from EOB, Pt completed LB dressing while sitting on EOB donning socks, MINA required - Pt reports wife helps with all dressing tasks as he has difficulty with [facility]. Pt amb to bathroom with no DME used, MINA to correct 1 LOB + for added steadiness throughout. Pt reported "feeling like I am drunk," seated pt on bed, vitals read: BP 195/119 (MAP 141), HR 68bmp, Sp02 97%. Pt returned to bed to rest and RN notified about elevated BP. Pt would benefit from skilled OT services to address noted impairments and functional limitations (see below for any additional details) in order to maximize safety and independence while minimizing falls risk and caregiver burden. OT will follow acutely.       If  plan is discharge home, recommend the following: A little help with walking and/or transfers;A little help with bathing/dressing/bathroom;Assistance with cooking/housework;Direct supervision/assist for financial management;Assistance with feeding;Assist for transportation;Direct supervision/assist for medications management    Functional Status Assessment  Patient has had a recent decline in their functional status and demonstrates the ability to make significant improvements in function in a reasonable and predictable amount of time.  Equipment Recommendations  Other (comment) (next venue of care)    Recommendations for Other Services       Precautions / Restrictions Precautions Precautions: Fall Restrictions Weight Bearing Restrictions: No      Mobility Bed Mobility Overal bed mobility: Independent Bed Mobility: Supine to Sit, Sit to Supine     Supine to sit: Supervision Sit to supine: Used rails, Supervision        Transfers Overall transfer level: Needs assistance   Transfers: Sit to/from Stand Sit to Stand: Contact guard assist                  Balance Overall balance assessment: Needs assistance Sitting-balance support: Feet supported, No upper extremity supported Sitting balance-Leahy Scale: Good     Standing balance support: During functional activity, No upper extremity supported Standing balance-Leahy Scale: Fair Standing balance comment: statice standing fair, dynamic balance poor on this date                           ADL either performed or assessed with clinical judgement   ADL Overall ADL's : Needs assistance/impaired  Grooming: Therapist, nutritional;Bed level;Independent               Lower Body Dressing: Minimal assistance Lower Body Dressing Details (indicate cue type and reason): Sitting on EOB donning socks, MINA required - Pt reports wife helps with all dressing tasks Toilet Transfer: Minimal assistance;Ambulation Toilet  Transfer Details (indicate cue type and reason): Simulated, no DME used, MINA to right 1 LOB + for added steadyness         Functional mobility during ADLs: Minimal assistance;Contact guard assist;Cueing for safety General ADL Comments: amb to bathroom from EOB with CGA initally, progressed to [facility] for stabilization and correction of 1 LOB.     Vision Baseline Vision/History: 3 Glaucoma Vision Assessment?: Yes Ocular Range of Motion: Within Functional Limits Alignment/Gaze Preference: Within Defined Limits Tracking/Visual Pursuits: Able to track stimulus in all quads without difficulty Saccades: Within functional limits Convergence: Within functional limits Diplopia Assessment: Other (comment)     Perception         Praxis         Pertinent Vitals/Pain Pain Assessment Pain Assessment: No/denies pain     Extremity/Trunk Assessment Upper Extremity Assessment Upper Extremity Assessment: Defer to OT evaluation LUE Deficits / Details: Tremor noted, pt reports chronic LUE Coordination: decreased fine motor   Lower Extremity Assessment Lower Extremity Assessment: RLE deficits/detail;LLE deficits/detail RLE Deficits / Details: Multi toe amputations; grossly 4+/5 MMT; impaired proprioception RLE Sensation: WNL RLE Coordination: decreased fine motor LLE Deficits / Details: grossly 4+/5 MMT LLE Sensation: WNL LLE Coordination: decreased fine motor   Cervical / Trunk Assessment Cervical / Trunk Assessment: Normal   Communication Communication Communication: No apparent difficulties Cueing Techniques: Verbal cues   Cognition Arousal: Alert Behavior During Therapy: WFL for tasks assessed/performed   Area of Impairment: Orientation, Following commands, Safety/judgement, Problem solving                 Orientation Level: Disoriented to, Time, ?situational      Following Commands: Follows multi-step commands with increased time Safety/Judgement: Decreased awareness  of safety, Decreased awareness of deficits   Problem Solving: Slow processing, Decreased initiation General Comments: pt was alert and oriented to self and place only     General Comments       Exercises Other Exercises Other Exercises: Edu: Role of OT, fall prevention, DME use during functional mobility   Shoulder Instructions      Home Living Family/patient expects to be discharged to:: Private residence Living Arrangements: Spouse/significant other Available Help at Discharge: Family;Available 24 hours/day Type of Home: House Home Access: Stairs to enter [employer] of Steps: threshold through the back; 5 STE front Entrance Stairs-Rails: Can reach both Home Layout: Multi-level;Able to live on main level with bedroom/bathroom Alternate Level Stairs-Number of Steps: flight upstairs but does not go upstairs Alternate Level Stairs-Rails: Can reach both Bathroom Shower/Tub: Producer, television/film/video: Standard Bathroom Accessibility: Yes   Home Equipment: Grab bars - toilet;Grab bars - tub/shower;Rollator (4 wheels);Shower seat;Wheelchair - manual          Prior Functioning/Environment               Mobility Comments: pt reports he does not need any help walking around; wife reports that she pushes him in a manual WC for community distances ADLs Comments: Pt Wife reports physical assistance as needed, for both ADLs/IADLs, utilizes RW at time within the home, driving short distances only.       OT Problem List: Decreased strength;Decreased  activity tolerance;Decreased cognition;Decreased safety awareness;Impaired balance (sitting and/or standing)      OT Treatment/Interventions: Self-care/ADL training;Therapeutic exercise;Energy conservation;Therapeutic activities;DME and/or AE instruction;Cognitive remediation/compensation;Balance training;Neuromuscular education    OT Goals(Current goals can be found in the care plan section) Acute Rehab OT  Goals Patient Stated Goal: to get better OT Goal Formulation: With patient Time For Goal Achievement: 05/11/23 Potential to Achieve Goals: Good ADL Goals Pt Will Perform Grooming: standing;with modified independence Pt Will Perform Lower Body Dressing: with modified independence;sit to/from stand Pt Will Transfer to Toilet: with modified independence;ambulating;regular height toilet Pt Will Perform Toileting - Clothing Manipulation and hygiene: with modified independence;sit to/from stand  OT Frequency: Min 1X/week    Co-evaluation              AM-PAC OT "6 Clicks" Daily Activity     Outcome Measure Help from another person eating meals?: A Little Help from another person taking care of personal grooming?: A Lot Help from another person toileting, which includes using toliet, bedpan, or urinal?: A Little Help from another person bathing (including washing, rinsing, drying)?: A Lot Help from another person to put on and taking off regular upper body clothing?: A Little Help from another person to put on and taking off regular lower body clothing?: A Little 6 Click Score: 16   End of Session Equipment Utilized During Treatment: Gait belt Nurse Communication: Mobility status  Activity Tolerance: Patient tolerated treatment well Patient left: in bed;with call bell/phone within reach;with bed alarm set  OT Visit Diagnosis: Other abnormalities of gait and mobility (R26.89);Other symptoms and signs involving cognitive function;Unsteadiness on feet (R26.81)                Time: 1610-9604 OT Time Calculation (min): 32 min Charges:  OT General Charges $OT Visit: 1 Visit OT Evaluation $OT Eval Moderate Complexity: 1 Mod  [employer], OTS

## 2023-04-27 NOTE — ED Notes (Signed)
Pt remains aox4 speech noted to be slurred pt neuro check otherwise unchanged

## 2023-04-28 DIAGNOSIS — N189 Chronic kidney disease, unspecified: Secondary | ICD-10-CM

## 2023-04-28 DIAGNOSIS — E785 Hyperlipidemia, unspecified: Secondary | ICD-10-CM

## 2023-04-28 DIAGNOSIS — I639 Cerebral infarction, unspecified: Secondary | ICD-10-CM | POA: Diagnosis not present

## 2023-04-28 DIAGNOSIS — N179 Acute kidney failure, unspecified: Secondary | ICD-10-CM | POA: Diagnosis not present

## 2023-04-28 DIAGNOSIS — E66811 Obesity, class 1: Secondary | ICD-10-CM

## 2023-04-28 DIAGNOSIS — E1142 Type 2 diabetes mellitus with diabetic polyneuropathy: Secondary | ICD-10-CM | POA: Diagnosis not present

## 2023-04-28 DIAGNOSIS — Z6831 Body mass index (BMI) 31.0-31.9, adult: Secondary | ICD-10-CM

## 2023-04-28 DIAGNOSIS — I10 Essential (primary) hypertension: Secondary | ICD-10-CM

## 2023-04-28 DIAGNOSIS — E6609 Other obesity due to excess calories: Secondary | ICD-10-CM

## 2023-04-28 DIAGNOSIS — E669 Obesity, unspecified: Secondary | ICD-10-CM | POA: Insufficient documentation

## 2023-04-28 LAB — BASIC METABOLIC PANEL
Anion gap: 8 (ref 5–15)
BUN: 28 mg/dL — ABNORMAL HIGH (ref 8–23)
CO2: 25 mmol/L (ref 22–32)
Calcium: 9 mg/dL (ref 8.9–10.3)
Chloride: 103 mmol/L (ref 98–111)
Creatinine, Ser: 1.38 mg/dL — ABNORMAL HIGH (ref 0.61–1.24)
GFR, Estimated: 57 mL/min — ABNORMAL LOW (ref >60)
Glucose, Bld: 153 mg/dL — ABNORMAL HIGH (ref 70–99)
Potassium: 3.5 mmol/L (ref 3.5–5.1)
Sodium: 136 mmol/L (ref 135–145)

## 2023-04-28 LAB — CBC
HCT: 42 % (ref 39.0–52.0)
Hemoglobin: 15 g/dL (ref 13.0–17.0)
MCH: 33.4 pg (ref 26.0–34.0)
MCHC: 35.7 g/dL (ref 30.0–36.0)
MCV: 93.5 fL (ref 80.0–100.0)
Platelets: 159 10*3/uL (ref 150–400)
RBC: 4.49 MIL/uL (ref 4.22–5.81)
RDW: 11.7 % (ref 11.5–15.5)
WBC: 7.5 10*3/uL (ref 4.0–10.5)
nRBC: 0 % (ref 0.0–0.2)

## 2023-04-28 LAB — GLUCOSE, CAPILLARY
Glucose-Capillary: 137 mg/dL — ABNORMAL HIGH (ref 70–99)
Glucose-Capillary: 155 mg/dL — ABNORMAL HIGH (ref 70–99)
Glucose-Capillary: 160 mg/dL — ABNORMAL HIGH (ref 70–99)
Glucose-Capillary: 165 mg/dL — ABNORMAL HIGH (ref 70–99)

## 2023-04-28 NOTE — Plan of Care (Signed)

## 2023-04-28 NOTE — TOC Transition Note (Signed)
Transition of Care [facility]) - CM/SW Discharge Note   Patient Details  Name: Troy Morse MRN: [id number] Date of Birth: 1960-11-22  Transition of Care [facility]) CM/SW Contact:  Luvenia Redden, RN Phone Number: 04/28/2023, 9:06 AM   Clinical Narrative:    Spoke with the patient and his spouse concerning the recommendations for outpatient PT. Lanora Manis  (Spouse) opt to go to [facility] near [employer]. Spouse is aware when pt follows up with his primary provider to request this choose clinic for her ongoing therapy for the appropriate paperwork and orders to this clinic for future therapy.  Spouse will be the responsible party to transport upon his discharge to his residence. No other needs presented at this time.    Final next level of care: Home/Self Care Barriers to Discharge: No Barriers Identified   Patient Goals and CMS Choice   Choice offered to / list presented to : Spouse  Discharge Placement                    Name of family member notified: Lanora Manis (spouse) [phone] Patient and family notified of of transfer: 04/28/23  Discharge Plan and Services Additional resources added to the After Visit Summary for                                       Social Determinants of Health (SDOH) Interventions SDOH Screenings   Food Insecurity: No Food Insecurity (04/27/2023)  Housing: Low Risk  (04/27/2023)  Transportation Needs: No Transportation Needs (04/27/2023)  Utilities: Not At Risk (04/27/2023)  Social Connections: Unknown (10/23/2021)   Received from [facility]  Tobacco Use: Low Risk  (04/27/2023)     Readmission Risk Interventions     No data to display

## 2023-04-28 NOTE — Progress Notes (Signed)
Progress Note   Patient: Troy Morse UJW:[id number] DOB: 08-05-1960 DOA: 04/26/2023     2 DOS: the patient was seen and examined on 04/28/2023   Brief hospital course: JAMARIO ELBAZ is a 62 y.o. male with medical history significant for GERD, type diabetes mellitus and hypertension, who presented to the emergency room with acute onset of suspected syncope with slurred speech and generalized weakness that was mostly noted on the left side concerning for CVA. MRI brain showed 1 cm acute ischemic nonhemorrhagic left dorsal pontine infarct, with additional punctate non hemorrhagic right cerebellar infarct.  Assessment and Plan: * Acute CVA (cerebrovascular accident) (HCC) Ischemic infarct involving the left dorsal pons and right cerebellum. Has some left-sided weakness and dysarthria. He is cleared by SLP, worked with PT yesterday. Follow neuro checks q.4 hours for 24 hours.   Neurology follow up appreciated, continue Aspirin, Plavix and statin. Bilateral carotid Doppler and 2D echo with bubble study pending.  Continue statin therapy, unable to calculate LDL due to high TG, repeat outpatient fasting lipids  Acute kidney injury superimposed on chronic kidney disease (HCC) Kidney function improved, he has stage IIIa chronic kidney disease. Avoid nephrotoxins.  Type 2 diabetes mellitus with peripheral neuropathy (HCC) A1C 8.1 Continue accucheks, sliding scale supplemental coverage with NovoLog. Hold off Glucophage. Continue Jardiance and Neurontin.  Dyslipidemia Continue statin therapy and will check fasting lipids as outpatient.  Essential hypertension IV hydralzine with Permissive parameters.  Will start his home Lisinopril once kidney function better.  Obesity  BMI 31.5 Diet, exercise and weight reduction advised.    Generalized weakness: PT/ OT evaluation and follow up Out of bed to chair. Incentive spirometry. Nursing supportive care. Fall, aspiration  precautions. DVT prophylaxis   Code Status: Full Code  Subjective: Patient is seen and examined today morning. He is lying in bed, worked with PT. States eating better. Wife at bedside. Wishes to go home.  Physical Exam: Vitals:   04/27/23 2356 04/28/23 0347 04/28/23 0757 04/28/23 1116  BP: 126/71 (!) 160/81 (!) 150/90 (!) 164/88  Pulse: 76 65 65 66  Resp: 14 12 14 16   Temp: 98.5 F (36.9 C) 98.3 F (36.8 C) 97.9 F (36.6 C) 98.1 F (36.7 C)  TempSrc: Oral Oral Oral   SpO2: 95% 98% 95% 95%  Weight:      Height:        General - Middle aged Caucasian male, no apparent distress HEENT - PERRLA, EOMI, atraumatic head, non tender sinuses. Lung - Clear, no rales, rhonchi, wheezes. Heart - S1, S2 heard, no murmurs, rubs, trace pedal edema. Abdomen - Soft, non tender, bowel sounds good Neuro - Alert, awake and oriented x 3, left sided weakness, mild dysarthria. Skin - Warm and dry.  Data Reviewed:      Latest Ref Rng & Units 04/28/2023    3:06 AM 04/26/2023    7:16 PM 03/19/2019    2:13 PM  CBC  WBC 4.0 - 10.5 K/uL 7.5  7.4  9.9   Hemoglobin 13.0 - 17.0 g/dL 56.2  13.0  86.5   Hematocrit 39.0 - 52.0 % 42.0  47.6  41.1   Platelets 150 - 400 K/uL 159  180  205       Latest Ref Rng & Units 04/28/2023    3:06 AM 04/26/2023    7:16 PM 03/19/2019    2:13 PM  BMP  Glucose 70 - 99 mg/dL 784  696  295   BUN 8 - 23 mg/dL  28  30  27    Creatinine 0.61 - 1.24 mg/dL 4.09  8.11  9.14   Sodium 135 - 145 mmol/L 136  133  138   Potassium 3.5 - 5.1 mmol/L 3.5  4.4  4.6   Chloride 98 - 111 mmol/L 103  98  105   CO2 22 - 32 mmol/L 25  26  24    Calcium 8.9 - 10.3 mg/dL 9.0  8.7  9.8    US Carotid Bilateral (at [facility] and [facility] only)  Result Date: 04/28/2023 CLINICAL DATA:  Stroke.  Left pontine and right cerebellar infarcts. EXAM: BILATERAL CAROTID DUPLEX ULTRASOUND TECHNIQUE: Wallace Cullens scale imaging, color Doppler and duplex ultrasound were performed of bilateral carotid and vertebral arteries  in the neck. COMPARISON:  Brain MRI 04/26/2023 FINDINGS: Criteria: Quantification of carotid stenosis is based on velocity parameters that correlate the residual internal carotid diameter with NASCET-based stenosis levels, using the diameter of the distal internal carotid lumen as the denominator for stenosis measurement. The following velocity measurements were obtained: RIGHT ICA: 98/10 cm/sec CCA: 99/10 cm/sec SYSTOLIC ICA/CCA RATIO:  1.0 ECA: 145 cm/sec LEFT ICA: 95/13 cm/sec CCA: 112/11 cm/sec SYSTOLIC ICA/CCA RATIO:  0.8 ECA: 119 cm/sec RIGHT CAROTID ARTERY: Small amount of echogenic plaque at the right carotid bulb. External carotid artery is patent with normal waveform. Normal waveforms and velocities in the internal carotid artery. RIGHT VERTEBRAL ARTERY: Antegrade flow and normal waveform in the right vertebral artery. LEFT CAROTID ARTERY: Small amount of echogenic plaque at the left carotid bulb. External carotid artery is patent with normal waveform. Normal waveforms and velocities in the internal carotid artery. LEFT VERTEBRAL ARTERY: Antegrade flow and normal waveform in the left vertebral artery. IMPRESSION: 1. Small amount of plaque at the carotid bulbs. Estimated degree of stenosis in the internal carotid arteries is less than 50% bilaterally. 2. Antegrade flow in both vertebral arteries. Electronically Signed   By: Richarda Overlie M.D.   On: 04/28/2023 11:52   MR ANGIO HEAD WO CONTRAST  Result Date: 04/27/2023 CLINICAL DATA:  Neuro deficit, acute, stroke suspected. EXAM: MRA HEAD WITHOUT CONTRAST TECHNIQUE: Angiographic images of the Circle of Willis were acquired using MRA technique without intravenous contrast. COMPARISON:  MRI brain 04/26/2023. FINDINGS: Anterior circulation: Intracranial ICAs are patent without stenosis or aneurysm. The proximal ACAs and MCAs are patent without stenosis or aneurysm. Distal branches are symmetric. Posterior circulation: Normal basilar artery. The MCAs and right  AICA are patent proximally. Left AICA is not well visualized, which may be due to technique, anatomic variation, or occlusion. The PCAs are patent proximally without stenosis or aneurysm. Distal branches are symmetric. Anatomic variants: None. Other: None. IMPRESSION: 1. Left AICA is not well visualized, which may be due to technique, anatomic variation, or occlusion. 2. Otherwise, unremarkable MRA of the head. Electronically Signed   By: Orvan Falconer M.D.   On: 04/27/2023 09:48   MR BRAIN WO CONTRAST  Result Date: 04/26/2023 CLINICAL DATA:  Initial evaluation for neuro deficit, stroke suspected. EXAM: MRI HEAD WITHOUT CONTRAST TECHNIQUE: Multiplanar, multiecho pulse sequences of the brain and surrounding structures were obtained without intravenous contrast. COMPARISON:  CT from earlier the same day. FINDINGS: Brain: Generalized age-related cerebral atrophy. Patchy and confluent T2/FLAIR hyperintensity involving the periventricular deep white matter both cerebral hemispheres. Patchy involvement of the thalami, pons, and cerebellum. Findings consistent with chronic small vessel ischemic disease, moderately advanced in nature. Few scatter remote lacunar infarcts present about the bilateral thalami and pons. Few scattered small  remote bilateral cerebellar infarcts. Small chronic bilateral PCA distribution infarcts, right larger than left. 1 cm linear focus of restricted diffusion involving the left dorsal pons, consistent with an acute ischemic nonhemorrhagic infarct (series 5, image 72). Additional punctate acute ischemic nonhemorrhagic infarct noted within the right cerebellum as well (series 5, image 69). Gray-white matter differentiation otherwise maintained. No acute intracranial hemorrhage. Few chronic micro hemorrhages noted at the right cerebellum and brainstem, likely hypertensive in nature. No mass lesion, midline shift or mass effect. No hydrocephalus or extra-axial fluid collection. Pituitary gland  suprasellar region within normal limits. Vascular: Major intracranial vascular flow voids are maintained. Skull and upper cervical spine: Cranial junctional limits. Bone marrow signal intensity normal. No scalp soft tissue abnormality. Sinuses/Orbits: Prior ocular lens replacement on the left. Paranasal sinuses are clear. No significant mastoid effusion. Other: None. IMPRESSION: 1. 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 2. Underlying age-related cerebral atrophy with moderately advanced chronic microvascular ischemic disease, with a few scattered remote lacunar infarcts involving the bilateral thalami, pons, and cerebellum. 3. Small chronic bilateral PCA distribution infarcts, right larger than left. Electronically Signed   By: Rise Mu M.D.   On: 04/26/2023 21:59   CT HEAD CODE STROKE WO CONTRAST  Result Date: 04/26/2023 CLINICAL DATA:  Code stroke.  Altered mental status.  Syncope. EXAM: CT HEAD WITHOUT CONTRAST TECHNIQUE: Contiguous axial images were obtained from the base of the skull through the vertex without intravenous contrast. RADIATION DOSE REDUCTION: This exam was performed according to the departmental dose-optimization program which includes automated exposure control, adjustment of the mA and/or kV according to patient size and/or use of iterative reconstruction technique. COMPARISON:  None Available. FINDINGS: Brain: No hemorrhage. No hydrocephalus. No extra-axial fluid collection. Or mass effect. No mass lesion. There is a age indeterminate, but likely chronic infarct in the right occipital lobe. There has an age indeterminate, but likely chronic infarct in the left cerebellum. Vascular: No hyperdense vessel or unexpected calcification. Skull: Normal. Negative for fracture or focal lesion. Sinuses/Orbits: No middle ear mastoid effusion. Paranasal sinuses are clear. Left lens replacement.  Orbits are otherwise unremarkable. Other: None. ASPECTS [facility] Stroke Program Early CT Score): 10 IMPRESSION: Age indeterminate, but likely chronic infarcts in the right occipital lobe and bilateral cerebellum. No hemorrhage. Recommend further evaluation with a brain MRI for more definitive characterization. Findings were discussed with Dr. Marisa Severin on 04/26/23 at 7:27 PM. Electronically Signed   By: Lorenza Cambridge M.D.   On: 04/26/2023 19:27     Family Communication: Discussed with patient, wife they understand and agree. All questions answereed.  Disposition: Status is: Inpatient Remains inpatient appropriate because: stroke work up  Planned Discharge Destination: Home     Time spent: 39 minutes  Author: Marcelino Duster, MD 04/28/2023 12:51 PM Secure chat 7am to 7pm For on call review [email or web address].

## 2023-04-29 ENCOUNTER — Inpatient Hospital Stay (HOSPITAL_COMMUNITY)
Admit: 2023-04-29 | Discharge: 2023-04-29 | Disposition: A | Discharge status: Home / Self Care | Payer: Medicare Other | Attending: Family Medicine | Admitting: Family Medicine

## 2023-04-29 DIAGNOSIS — I6389 Other cerebral infarction: Secondary | ICD-10-CM | POA: Diagnosis not present

## 2023-04-29 DIAGNOSIS — E1142 Type 2 diabetes mellitus with diabetic polyneuropathy: Secondary | ICD-10-CM | POA: Diagnosis not present

## 2023-04-29 DIAGNOSIS — N179 Acute kidney failure, unspecified: Secondary | ICD-10-CM | POA: Diagnosis not present

## 2023-04-29 DIAGNOSIS — E669 Obesity, unspecified: Secondary | ICD-10-CM

## 2023-04-29 DIAGNOSIS — E785 Hyperlipidemia, unspecified: Secondary | ICD-10-CM | POA: Diagnosis not present

## 2023-04-29 DIAGNOSIS — I639 Cerebral infarction, unspecified: Secondary | ICD-10-CM | POA: Diagnosis not present

## 2023-04-29 LAB — ECHOCARDIOGRAM COMPLETE BUBBLE STUDY
Area-P 1/2: 2.39 cm2
S' Lateral: 2.7 cm

## 2023-04-29 LAB — GLUCOSE, CAPILLARY
Glucose-Capillary: 102 mg/dL — ABNORMAL HIGH (ref 70–99)
Glucose-Capillary: 173 mg/dL — ABNORMAL HIGH (ref 70–99)

## 2023-04-29 MED ORDER — CLOPIDOGREL BISULFATE 75 MG PO TABS: 75.0000 mg | ORAL_TABLET | Freq: Every day | ORAL | 0 refills | Status: AC

## 2023-04-29 MED ORDER — METOPROLOL TARTRATE 25 MG PO TABS: 25.0000 mg | ORAL_TABLET | Freq: Two times a day (BID) | ORAL | 2 refills | Status: AC

## 2023-04-29 MED ORDER — ATORVASTATIN CALCIUM 40 MG PO TABS: 40.0000 mg | ORAL_TABLET | Freq: Every day | ORAL | 3 refills | Status: AC

## 2023-04-29 NOTE — Discharge Summary (Signed)
Physician Discharge Summary   Patient: Troy Morse MRN: [id number] DOB: 13-Jul-1960  Admit date:     04/26/2023  Discharge date: 04/29/23  Discharge Physician: Marcelino Duster   PCP: Mickey Farber, MD   Recommendations at discharge:    PCP follow up in 1 week. Patient will need outpatient fasting lipid profile, follow echocardiogram official report.  Discharge Diagnoses: Principal Problem:   Acute CVA (cerebrovascular accident) [facility]) Active Problems:   Type 2 diabetes mellitus with peripheral neuropathy (HCC)   Acute kidney injury superimposed on chronic kidney disease (HCC)   Dyslipidemia   Essential hypertension   Obesity (BMI 30-39.9)  Resolved Problems:   * No resolved hospital problems. *  Hospital Course: Troy Morse is a 62 y.o. male with medical history significant for GERD, type diabetes mellitus and hypertension, who presented to the emergency room with acute onset of suspected syncope with slurred speech and generalized weakness that was mostly noted on the left side concerning for CVA. MRI brain showed 1 cm acute ischemic nonhemorrhagic left dorsal pontine infarct, with additional punctate non hemorrhagic right cerebellar infarct.  Patient is admitted to the hospitalist service for further management evaluation of acute ischemic stroke workup.  Assessment and Plan: * Acute CVA (cerebrovascular accident) (HCC) Ischemic infarct involving the left dorsal pons and right cerebellum. Has some left-sided weakness and dysarthria. A1c 8.1%.  LDL unable to be calculated due to severe hypertriglyceridemia.  Advised outpatient lipid profile. He is cleared by SLP, worked with PT who advised rolling walker, outpatient PT Neurology follow up heart to continue Aspirin, Plavix for 21 days and then aspirin daily. Bilateral carotid Doppler unremarkable and 2D echo with bubble study done, report pending. Continue statin therapy.  Repeat outpatient fasting lipids. Outpatient  PCP follow-up in 1 week.   Acute kidney injury superimposed on chronic kidney disease (HCC) Kidney function improved, he has stage IIIa chronic kidney disease. Avoid nephrotoxins. Patient is not taking lisinopril at home.   Type 2 diabetes mellitus with peripheral neuropathy (HCC) A1C 8.1 Continue accucheks, sliding scale supplemental coverage with NovoLog. Hold off Glucophage. Continue Jardiance and Neurontin.   Dyslipidemia Continue statin therapy and will check fasting lipids as outpatient.   Essential hypertension Patient is not on lisinopril at home. Started metoprolol 25 mg twice daily. Advised to follow-up PCP upon discharge as instructed.   Obesity  BMI 31.5 Diet, exercise and weight reduction advised.   Generalized weakness: PT/ OT evaluation and follow up suggested rolling walker, outpatient physical therapy which will be set up by TOC.       Consultants: Neurology Procedures performed: None Disposition: Home Diet recommendation:  Discharge Diet Orders (From admission, onward)     Start     Ordered   04/29/23 0000  Diet - low sodium heart healthy        04/29/23 1457           Cardiac and Carb modified diet DISCHARGE MEDICATION: Allergies as of 04/29/2023       Reactions   Empagliflozin Other (See Comments)   Pancreatitis   Liraglutide Other (See Comments)   Pancreatitis ALSO KNOWN AS VICTOZA         Medication List     STOP taking these medications    levofloxacin 250 MG tablet Commonly known as: Levaquin   levofloxacin 500 MG tablet Commonly known as: LEVAQUIN   lisinopril 20 MG tablet Commonly known as: ZESTRIL   pravastatin 80 MG tablet Commonly known as: PRAVACHOL  TAKE these medications    Alpha-Lipoic Acid 200 MG Caps Take 1 capsule by mouth daily.   aspirin EC 81 MG tablet Take 81 mg by mouth at bedtime.   atorvastatin 40 MG tablet Commonly known as: LIPITOR Take 1 tablet (40 mg total) by mouth daily.    brimonidine 0.2 % ophthalmic solution Commonly known as: ALPHAGAN Place 1 drop into both eyes 2 (two) times a day.   clopidogrel 75 MG tablet Commonly known as: PLAVIX Take 1 tablet (75 mg total) by mouth daily for 20 days. Start taking on: April 30, 2023   gabapentin 300 MG capsule Commonly known as: NEURONTIN Take 300 mg by mouth every evening.   icosapent Ethyl 1 g capsule Commonly known as: VASCEPA Take 2 g by mouth daily.   Jardiance 10 MG Tabs tablet Generic drug: empagliflozin Take 25 mg by mouth daily.   latanoprost 0.005 % ophthalmic solution Commonly known as: XALATAN Place 1 drop into both eyes at bedtime.   Melatonin 5 MG Chew Chew 5 mg by mouth at bedtime.   metFORMIN 500 MG tablet Commonly known as: GLUCOPHAGE Take 1,000 mg by mouth at bedtime.   metoprolol tartrate 25 MG tablet Commonly known as: LOPRESSOR Take 1 tablet (25 mg total) by mouth 2 (two) times daily.   NONFORMULARY OR COMPOUNDED ITEM Trimix (30/1/10)-(Pap/Phent/PGE)  Dosage: Inject 1ml per injection  Prefilled Syringe # Test Dose 3ml vial Vial 5ml   Qty 5ml Refills 3  [facility] [phone] Fax [phone]   NovoLOG FlexPen 100 UNIT/ML FlexPen Generic drug: insulin aspart Inject 0-14 Units into the skin See admin instructions. Inject under the skin 3 times daily at mealtimes according to sliding scale:  100-150: 0u 151-200: 5u 201-250: 8u 251-300: 10u 301-350: 12u >351: 14u   primidone 250 MG tablet Commonly known as: MYSOLINE Take 250 mg by mouth daily.   sertraline 100 MG tablet Commonly known as: ZOLOFT Take 100 mg by mouth at bedtime.   Evaristo Bury FlexTouch 200 UNIT/ML FlexTouch Pen Generic drug: insulin degludec Inject 88-98 Units into the skin every evening.               Durable Medical Equipment  (From admission, onward)           Start     Ordered   04/29/23 1455  DME Walker  Once       Question Answer Comment  Walker: With 5  Inch Wheels   Patient needs a walker to treat with the following condition Acute ischemic stroke (HCC)      04/29/23 1457            Follow-up Information     Mickey Farber, MD Follow up in 1 week(s).   Specialty: Internal Medicine Contact information: [address] [facility] [place] [address] [phone]                Discharge Exam: Ceasar Mons Weights   04/26/23 1910  Weight: 99.8 kg   General - Middle aged Caucasian male, no apparent distress HEENT - PERRLA, EOMI, atraumatic head, non tender sinuses. Lung - Clear, no rales, rhonchi, wheezes. Heart - S1, S2 heard, no murmurs, rubs, trace pedal edema. Abdomen - Soft, non tender, bowel sounds good Neuro - Alert, awake and oriented x 3, left sided weakness, mild dysarthria. Skin - Warm and dry.  Condition at discharge: stable  The results of significant diagnostics from this hospitalization (including imaging, microbiology, ancillary and laboratory) are listed  below for reference.   Imaging Studies: ECHOCARDIOGRAM COMPLETE BUBBLE STUDY  Result Date: 04/29/2023    ECHOCARDIOGRAM REPORT   Patient Name:   Troy Morse [facility] Date of Exam: 04/29/2023 Medical Rec #:  [id number]        Height:       70.0 in Accession #:    [id number]       Weight:       220.0 lb Date of Birth:  1960/12/18         BSA:          2.174 m Patient Age:    62 years         BP:           148/92 mmHg Patient Gender: M                HR:           73 bpm. Exam Location:  [facility] Procedure: 2D Echo and Saline Contrast Bubble Study Indications:     Stroke I63.9  History:         Patient has no prior history of Echocardiogram examinations.  Sonographer:     Overton Mam RDCS, FASE Referring Phys:  [id number] Vernetta Honey MANSY Diagnosing Phys: Julien Nordmann MD IMPRESSIONS  1. Agitated saline contrast bubble study was positive with shunting observed within 3-6 cardiac cycles suggestive of interatrial shunt. There is a small patent foramen ovale.  2.  Left ventricular ejection fraction, by estimation, is 55 to 60%. Left ventricular ejection fraction by PLAX is 59 %. The left ventricle has normal function. The left ventricle has no regional wall motion abnormalities. There is moderate asymmetric left ventricular hypertrophy of the basal-septal segment. Left ventricular diastolic parameters are consistent with Grade I diastolic dysfunction (impaired relaxation).  3. Right ventricular systolic function is normal. The right ventricular size is normal.  4. The mitral valve is normal in structure. No evidence of mitral valve regurgitation. No evidence of mitral stenosis. Moderate mitral annular calcification.  5. The aortic valve is tricuspid. Aortic valve regurgitation is not visualized. Aortic valve sclerosis is present, with no evidence of aortic valve stenosis.  6. There is borderline dilatation of the aortic root, measuring 37 mm.  7. The inferior vena cava is normal in size with greater than 50% respiratory variability, suggesting right atrial pressure of 3 mmHg. FINDINGS  Left Ventricle: Left ventricular ejection fraction, by estimation, is 55 to 60%. Left ventricular ejection fraction by PLAX is 59 %. The left ventricle has normal function. The left ventricle has no regional wall motion abnormalities. The left ventricular internal cavity size was normal in size. There is moderate asymmetric left ventricular hypertrophy of the basal-septal segment. Left ventricular diastolic parameters are consistent with Grade I diastolic dysfunction (impaired relaxation). Right Ventricle: The right ventricular size is normal. No increase in right ventricular wall thickness. Right ventricular systolic function is normal. Left Atrium: Left atrial size was normal in size. Right Atrium: Right atrial size was normal in size. Pericardium: There is no evidence of pericardial effusion. Mitral Valve: The mitral valve is normal in structure. Moderate mitral annular calcification. No  evidence of mitral valve regurgitation. No evidence of mitral valve stenosis. MV peak gradient, 5.3 mmHg. The mean mitral valve gradient is 3.0 mmHg. Tricuspid Valve: The tricuspid valve is normal in structure. Tricuspid valve regurgitation is not demonstrated. No evidence of tricuspid stenosis. Aortic Valve: The aortic valve is tricuspid. Aortic valve regurgitation is not visualized. Aortic valve sclerosis  is present, with no evidence of aortic valve stenosis. Pulmonic Valve: The pulmonic valve was normal in structure. Pulmonic valve regurgitation is not visualized. No evidence of pulmonic stenosis. Aorta: The aortic root is normal in size and structure. There is borderline dilatation of the aortic root, measuring 37 mm. Venous: The inferior vena cava is normal in size with greater than 50% respiratory variability, suggesting right atrial pressure of 3 mmHg. IAS/Shunts: No atrial level shunt detected by color flow Doppler. Agitated saline contrast was given intravenously to evaluate for intracardiac shunting. Agitated saline contrast bubble study was positive with shunting observed within 3-6 cardiac cycles suggestive of interatrial shunt. A small patent foramen ovale is detected.  LEFT VENTRICLE PLAX 2D LV EF:         Left            Diastology                ventricular     LV e' medial:    6.96 cm/s                ejection        LV E/e' medial:  10.1                fraction by     LV e' lateral:   10.60 cm/s                PLAX is 59      LV E/e' lateral: 6.6                %. LVIDd:         3.90 cm LVIDs:         2.70 cm LV PW:         1.50 cm LV IVS:        1.40 cm LVOT diam:     2.30 cm LVOT Area:     4.15 cm  RIGHT VENTRICLE RV Basal diam:  1.80 cm RV S prime:     8.16 cm/s TAPSE (M-mode): 1.3 cm LEFT ATRIUM             Index        RIGHT ATRIUM          Index LA diam:        4.60 cm 2.12 cm/m   RA Area:     9.25 cm LA Vol (A2C):   64.3 ml 29.58 ml/m  RA Volume:   15.20 ml 6.99 ml/m LA Vol (A4C):    39.3 ml 18.08 ml/m LA Biplane Vol: 51.3 ml 23.60 ml/m                        PULMONIC VALVE AORTA                 PV Vmax:        0.87 m/s Ao Root diam: 3.90 cm PV Peak grad:   3.0 mmHg Ao Asc diam:  3.70 cm RVOT Peak grad: 2 mmHg  MITRAL VALVE MV Area (PHT): 2.39 cm     SHUNTS MV Peak grad:  5.3 mmHg     Systemic Diam: 2.30 cm MV Mean grad:  3.0 mmHg MV Vmax:       1.15 m/s MV Vmean:      75.4 cm/s MV Decel Time: 318 msec MV E velocity: 70.20 cm/s MV A velocity: 108.00 cm/s MV E/A ratio:  0.65 Julien Nordmann MD Electronically  signed by Julien Nordmann MD Signature Date/Time: 04/29/2023/2:46:30 PM    Final    US Carotid Bilateral (at [facility] and [facility] only)  Result Date: 04/28/2023 CLINICAL DATA:  Stroke.  Left pontine and right cerebellar infarcts. EXAM: BILATERAL CAROTID DUPLEX ULTRASOUND TECHNIQUE: Wallace Cullens scale imaging, color Doppler and duplex ultrasound were performed of bilateral carotid and vertebral arteries in the neck. COMPARISON:  Brain MRI 04/26/2023 FINDINGS: Criteria: Quantification of carotid stenosis is based on velocity parameters that correlate the residual internal carotid diameter with NASCET-based stenosis levels, using the diameter of the distal internal carotid lumen as the denominator for stenosis measurement. The following velocity measurements were obtained: RIGHT ICA: 98/10 cm/sec CCA: 99/10 cm/sec SYSTOLIC ICA/CCA RATIO:  1.0 ECA: 145 cm/sec LEFT ICA: 95/13 cm/sec CCA: 112/11 cm/sec SYSTOLIC ICA/CCA RATIO:  0.8 ECA: 119 cm/sec RIGHT CAROTID ARTERY: Small amount of echogenic plaque at the right carotid bulb. External carotid artery is patent with normal waveform. Normal waveforms and velocities in the internal carotid artery. RIGHT VERTEBRAL ARTERY: Antegrade flow and normal waveform in the right vertebral artery. LEFT CAROTID ARTERY: Small amount of echogenic plaque at the left carotid bulb. External carotid artery is patent with normal waveform. Normal waveforms and velocities in the  internal carotid artery. LEFT VERTEBRAL ARTERY: Antegrade flow and normal waveform in the left vertebral artery. IMPRESSION: 1. Small amount of plaque at the carotid bulbs. Estimated degree of stenosis in the internal carotid arteries is less than 50% bilaterally. 2. Antegrade flow in both vertebral arteries. Electronically Signed   By: Richarda Overlie M.D.   On: 04/28/2023 11:52   MR ANGIO HEAD WO CONTRAST  Result Date: 04/27/2023 CLINICAL DATA:  Neuro deficit, acute, stroke suspected. EXAM: MRA HEAD WITHOUT CONTRAST TECHNIQUE: Angiographic images of the Circle of Willis were acquired using MRA technique without intravenous contrast. COMPARISON:  MRI brain 04/26/2023. FINDINGS: Anterior circulation: Intracranial ICAs are patent without stenosis or aneurysm. The proximal ACAs and MCAs are patent without stenosis or aneurysm. Distal branches are symmetric. Posterior circulation: Normal basilar artery. The MCAs and right AICA are patent proximally. Left AICA is not well visualized, which may be due to technique, anatomic variation, or occlusion. The PCAs are patent proximally without stenosis or aneurysm. Distal branches are symmetric. Anatomic variants: None. Other: None. IMPRESSION: 1. Left AICA is not well visualized, which may be due to technique, anatomic variation, or occlusion. 2. Otherwise, unremarkable MRA of the head. Electronically Signed   By: Orvan Falconer M.D.   On: 04/27/2023 09:48   MR BRAIN WO CONTRAST  Result Date: 04/26/2023 CLINICAL DATA:  Initial evaluation for neuro deficit, stroke suspected. EXAM: MRI HEAD WITHOUT CONTRAST TECHNIQUE: Multiplanar, multiecho pulse sequences of the brain and surrounding structures were obtained without intravenous contrast. COMPARISON:  CT from earlier the same day. FINDINGS: Brain: Generalized age-related cerebral atrophy. Patchy and confluent T2/FLAIR hyperintensity involving the periventricular deep white matter both cerebral hemispheres. Patchy  involvement of the thalami, pons, and cerebellum. Findings consistent with chronic small vessel ischemic disease, moderately advanced in nature. Few scatter remote lacunar infarcts present about the bilateral thalami and pons. Few scattered small remote bilateral cerebellar infarcts. Small chronic bilateral PCA distribution infarcts, right larger than left. 1 cm linear focus of restricted diffusion involving the left dorsal pons, consistent with an acute ischemic nonhemorrhagic infarct (series 5, image 72). Additional punctate acute ischemic nonhemorrhagic infarct noted within the right cerebellum as well (series 5, image 69). Gray-white matter differentiation otherwise maintained. No acute intracranial hemorrhage. Few  chronic micro hemorrhages noted at the right cerebellum and brainstem, likely hypertensive in nature. No mass lesion, midline shift or mass effect. No hydrocephalus or extra-axial fluid collection. Pituitary gland suprasellar region within normal limits. Vascular: Major intracranial vascular flow voids are maintained. Skull and upper cervical spine: Cranial junctional limits. Bone marrow signal intensity normal. No scalp soft tissue abnormality. Sinuses/Orbits: Prior ocular lens replacement on the left. Paranasal sinuses are clear. No significant mastoid effusion. Other: None. IMPRESSION: 1. 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 2. Underlying age-related cerebral atrophy with moderately advanced chronic microvascular ischemic disease, with a few scattered remote lacunar infarcts involving the bilateral thalami, pons, and cerebellum. 3. Small chronic bilateral PCA distribution infarcts, right larger than left. Electronically Signed   By: Rise Mu M.D.   On: 04/26/2023 21:59   CT HEAD CODE STROKE WO CONTRAST  Result Date: 04/26/2023 CLINICAL DATA:  Code stroke.  Altered mental status.  Syncope.  EXAM: CT HEAD WITHOUT CONTRAST TECHNIQUE: Contiguous axial images were obtained from the base of the skull through the vertex without intravenous contrast. RADIATION DOSE REDUCTION: This exam was performed according to the departmental dose-optimization program which includes automated exposure control, adjustment of the mA and/or kV according to patient size and/or use of iterative reconstruction technique. COMPARISON:  None Available. FINDINGS: Brain: No hemorrhage. No hydrocephalus. No extra-axial fluid collection. Or mass effect. No mass lesion. There is a age indeterminate, but likely chronic infarct in the right occipital lobe. There has an age indeterminate, but likely chronic infarct in the left cerebellum. Vascular: No hyperdense vessel or unexpected calcification. Skull: Normal. Negative for fracture or focal lesion. Sinuses/Orbits: No middle ear mastoid effusion. Paranasal sinuses are clear. Left lens replacement. Orbits are otherwise unremarkable. Other: None. ASPECTS [facility] Stroke Program Early CT Score): 10 IMPRESSION: Age indeterminate, but likely chronic infarcts in the right occipital lobe and bilateral cerebellum. No hemorrhage. Recommend further evaluation with a brain MRI for more definitive characterization. Findings were discussed with Dr. Marisa Severin on 04/26/23 at 7:27 PM. Electronically Signed   By: Lorenza Cambridge M.D.   On: 04/26/2023 19:27    Microbiology: Results for orders placed or performed during the hospital encounter of 03/21/19  Aerobic/Anaerobic Culture (surgical/deep wound)     Status: None   Collection Time: 03/21/19  1:50 PM   Specimen: Wound  Result Value Ref Range Status   Specimen Description   Final    WOUND Performed at [facility], [address], [place], [place] [address]    Special Requests OSTEOMYLETITIS RIGHT GREAT TOE  Final   Gram Stain   Final    RARE WBC PRESENT, PREDOMINANTLY PMN NO ORGANISMS SEEN    Culture   Final    FEW PSEUDOMONAS  AERUGINOSA NO ANAEROBES ISOLATED Performed at [facility], [address], [place], [place] [address]    Report Status 03/26/2019 FINAL  Final   Organism ID, Bacteria PSEUDOMONAS AERUGINOSA  Final      Susceptibility   Pseudomonas aeruginosa - MIC*    CEFTAZIDIME 4 SENSITIVE Sensitive     CIPROFLOXACIN <=0.25 SENSITIVE Sensitive     GENTAMICIN <=1 SENSITIVE Sensitive     IMIPENEM 1 SENSITIVE Sensitive     PIP/TAZO 8 SENSITIVE Sensitive     CEFEPIME 2 SENSITIVE Sensitive     * FEW PSEUDOMONAS AERUGINOSA    Labs: CBC: Recent Labs  Lab 04/26/23 1916 04/28/23 0306  WBC 7.4  7.5  NEUTROABS 5.4  --   HGB 16.3 15.0  HCT 47.6 42.0  MCV 97.5 93.5  PLT 180 159   Basic Metabolic Panel: Recent Labs  Lab 04/26/23 1916 04/28/23 0306  NA 133* 136  K 4.4 3.5  CL 98 103  CO2 26 25  GLUCOSE 251* 153*  BUN 30* 28*  CREATININE 1.76* 1.38*  CALCIUM 8.7* 9.0   Liver Function Tests: Recent Labs  Lab 04/26/23 1916  AST 25  ALT 30  ALKPHOS 82  BILITOT 0.7  PROT 7.5  ALBUMIN 4.1   CBG: Recent Labs  Lab 04/28/23 1118 04/28/23 1609 04/28/23 2059 04/29/23 0834 04/29/23 1213  GLUCAP 155* 160* 165* 102* 173*    Discharge time spent: 38 minutes.  Signed: Marcelino Duster, MD [facility] 04/29/2023

## 2023-04-29 NOTE — Plan of Care (Signed)
  Problem: Education: Goal: Ability to describe self-care measures that may prevent or decrease complications (Diabetes Survival Skills Education) will improve Outcome: Progressing Goal: Individualized Educational Video(s) Outcome: Progressing   Problem: Coping: Goal: Ability to adjust to condition or change in health will improve Outcome: Progressing   Problem: Fluid Volume: Goal: Ability to maintain a balanced intake and output will improve Outcome: Progressing   Problem: Health Behavior/Discharge Planning: Goal: Ability to identify and utilize available resources and services will improve Outcome: Progressing Goal: Ability to manage health-related needs will improve Outcome: Progressing   Problem: Metabolic: Goal: Ability to maintain appropriate glucose levels will improve Outcome: Progressing   Problem: Nutritional: Goal: Maintenance of adequate nutrition will improve Outcome: Progressing Goal: Progress toward achieving an optimal weight will improve Outcome: Progressing   Problem: Skin Integrity: Goal: Risk for impaired skin integrity will decrease Outcome: Progressing   Problem: Tissue Perfusion: Goal: Adequacy of tissue perfusion will improve Outcome: Progressing   Problem: Education: Goal: Knowledge of disease or condition will improve Outcome: Progressing Goal: Knowledge of secondary prevention will improve (MUST DOCUMENT ALL) Outcome: Progressing Goal: Knowledge of patient specific risk factors will improve Loraine Leriche N/A or DELETE if not current risk factor) Outcome: Progressing   Problem: Ischemic Stroke/TIA Tissue Perfusion: Goal: Complications of ischemic stroke/TIA will be minimized Outcome: Progressing   Problem: Coping: Goal: Will verbalize positive feelings about self Outcome: Progressing Goal: Will identify appropriate support needs Outcome: Progressing   Problem: Health Behavior/Discharge Planning: Goal: Ability to manage health-related needs  will improve Outcome: Progressing Goal: Goals will be collaboratively established with patient/family Outcome: Progressing   Problem: Self-Care: Goal: Ability to participate in self-care as condition permits will improve Outcome: Progressing Goal: Verbalization of feelings and concerns over difficulty with self-care will improve Outcome: Progressing Goal: Ability to communicate needs accurately will improve Outcome: Progressing

## 2023-04-29 NOTE — Progress Notes (Signed)
Physical Therapy Treatment Patient Details Name: Troy Morse MRN: [id number] DOB: Dec 24, 1960 Today's Date: 04/29/2023   History of Present Illness Pt is a 62 yo male who presented to the emergency room with acute onset of suspected syncope with slurred speech and generalized weakness that was mostly noted on the left side concerning for CVA. PMH includes GERD, type 2 diabetes mellitus, chronic renal disease, hypertension, R great toe excision, and previous back surgery.    PT Comments  Pt awake, ready for session.  He is able to get in and out of bed without assist. Stands with supervision.  He completes x 1 lap with RW with overall poor use keeping walker too far in front despite cues to correct.   X 1 lap with no AD where he shows decreased gait quality and appears to fall forward and increases steps to try to prevent falling.  Increased guarding and cues.  He endorses feeling like his upper body is going faster than his feet.  Tried RW again with education and pt is able to demonstrate improved use and staying up inside walker box.  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  Pt does not have shoes here but is encouraged to wear them at home for improved balance.  May benefit from prosthetic insert for shoe.   Recommend walker at all times at home.   If plan is discharge home, recommend the following: A little help with walking and/or transfers;A little help with bathing/dressing/bathroom;Help with stairs or ramp for entrance;Assist for transportation;Supervision due to cognitive status;Assistance with cooking/housework;Direct supervision/assist for medications management;Direct supervision/assist for financial management   Can travel by private vehicle        Equipment Recommendations  Rolling walker (2 wheels)    Recommendations for Other Services       Precautions / Restrictions Precautions Precautions: Fall Restrictions Weight Bearing  Restrictions: No     Mobility  Bed Mobility Overal bed mobility: Modified Independent               Patient Response: Cooperative  Transfers Overall transfer level: Needs assistance Equipment used: None, Rolling walker (2 wheels) Transfers: Sit to/from Stand Sit to Stand: Supervision                Ambulation/Gait Ambulation/Gait assistance: Supervision, Min assist Gait Distance (Feet): 600 Feet Assistive device: Rolling walker (2 wheels), None   Gait velocity: WFL     General Gait Details: gait improved with RW vs no AD   Stairs             Wheelchair Mobility     Tilt Bed Tilt Bed Patient Response: Cooperative  Modified Rankin (Stroke Patients Only)       Balance Overall balance assessment: Needs assistance Sitting-balance support: Feet supported, No upper extremity supported Sitting balance-Leahy Scale: Good     Standing balance support: During functional activity, No upper extremity supported, Bilateral upper extremity supported, Reliant on assistive device for balance Standing balance-Leahy Scale: Fair Standing balance comment: poor dynamic standing balance without RW; good dynamic standing balance with RW                            Cognition Arousal: Alert Behavior During Therapy: WFL for tasks assessed/performed Overall Cognitive Status: No family/caregiver present to determine baseline cognitive functioning  Following Commands: Follows multi-step commands with increased time                Exercises      General Comments        Pertinent Vitals/Pain Pain Assessment Pain Assessment: No/denies pain    Home Living                          Prior Function            PT Goals (current goals can now be found in the care plan section) Progress towards PT goals: Progressing toward goals    Frequency    Min 1X/week      PT Plan      Co-evaluation               AM-PAC PT "6 Clicks" Mobility   Outcome Measure  Help needed turning from your back to your side while in a flat bed without using bedrails?: None Help needed moving from lying on your back to sitting on the side of a flat bed without using bedrails?: None Help needed moving to and from a bed to a chair (including a wheelchair)?: None Help needed standing up from a chair using your arms (e.g., wheelchair or bedside chair)?: None Help needed to walk in hospital room?: A Little Help needed climbing 3-5 steps with a railing? : A Little 6 Click Score: 22    End of Session Equipment Utilized During Treatment: Gait belt Activity Tolerance: Patient tolerated treatment well;No increased pain Patient left: in bed;with call bell/phone within reach;with bed alarm set Nurse Communication: Mobility status PT Visit Diagnosis: Unsteadiness on feet (R26.81);Other abnormalities of gait and mobility (R26.89)     Time: 1610-9604 PT Time Calculation (min) (ACUTE ONLY): 17 min  Charges:    $Gait Training: 8-22 mins PT General Charges $$ ACUTE PT VISIT: 1 Visit                   Danielle Dess, PTA 04/29/23, 9:16 AM

## 2023-04-29 NOTE — Plan of Care (Signed)
Recommend - Outpatient cardiology referral for zio patch vs. Loop recorder and discussion of risk/benefit of PFO closure - Sent to primary team via inbasket  ECHO   1. Agitated saline contrast bubble study was positive with shunting  observed within 3-6 cardiac cycles suggestive of interatrial shunt. There  is a small patent foramen ovale.   2. Left ventricular ejection fraction, by estimation, is 55 to 60%. Left  ventricular ejection fraction by PLAX is 59 %. The left ventricle has  normal function. The left ventricle has no regional wall motion  abnormalities. There is moderate asymmetric  left ventricular hypertrophy of the basal-septal segment. Left ventricular  diastolic parameters are consistent with Grade I diastolic dysfunction  (impaired relaxation).   3. Right ventricular systolic function is normal. The right ventricular  size is normal.   4. The mitral valve is normal in structure. No evidence of mitral valve  regurgitation. No evidence of mitral stenosis. Moderate mitral annular  calcification.   5. The aortic valve is tricuspid. Aortic valve regurgitation is not  visualized. Aortic valve sclerosis is present, with no evidence of aortic  valve stenosis.   6. There is borderline dilatation of the aortic root, measuring 37 mm.   7. The inferior vena cava is normal in size with greater than 50%  respiratory variability, suggesting right atrial pressure of 3 mmHg.  [Normal biatrial sizes]  Carotid US 1. Small amount of plaque at the carotid bulbs. Estimated degree of stenosis in the internal carotid arteries is less than 50% bilaterally. 2. Antegrade flow in both vertebral arteries.    PT/OT notes reviewed: If plan is discharge home, recommend  the following: A little help with walking and/or transfers;A little help with bathing/dressing/bathroom;Help with stairs or ramp for entrance;Assist for transportation;Supervision due to cognitive status;Assistance with  cooking/housework;Direct supervision/assist for medications management;Direct supervision/assist for financial management  Additionally they recommend use of a walker at all times

## 2023-04-29 NOTE — Progress Notes (Signed)
Patient being discharged home. PIV removed. Discharged instructions were went over with patient and patients wife. Both agreed the understood and all questions were answered. Patient will be going home POV with wife.

## 2023-04-29 NOTE — Progress Notes (Signed)
  Echocardiogram 2D Echocardiogram has been performed. A Saline Microcavitation (Bubble Study) was requested and performed today.  Troy Morse 04/29/2023, 10:40 AM

## 2023-04-30 ENCOUNTER — Telehealth: Payer: Self-pay | Admitting: Cardiovascular Disease

## 2023-04-30 NOTE — Telephone Encounter (Signed)
Per Nicolasa Ducking, NP patient needs new patiente appointment for PFP and H/O stroke.  patient with acute stroke was discharged yesterday, echo showed IA shunt. Can you please provide outpatient cards follow up for zio patch vs. loop recorder and discussion of risk/benefit of PFO closure .(Needs to be New patient with DR. Agbor Etang or Dr. Mariah Milling)

## 2024-05-15 NOTE — Progress Notes (Signed)
 " [facility] Department of Occupational Therapy DISCHARGE EVALUATION   PATIENT NAME: Troy Morse ADMISSION DATE: 04/29/2024 DATE OF INITIAL EVALUATION: 04/30/24 DATE OF DISCHARGE EVALUATION: 05/14/24 TIME OF DISCHARGE EVALUATION: 0808 ROOM: 7303-1  REASON FOR ADMISSION: Troy Morse is a 63 y.o. male admitted to [facility] on 04/29/2024 with primary diagnosis of History of multiple strokes.  PAST MEDICAL HISTORY:  Patient  has a past medical history of Abnormal liver diagnostic imaging (05/26/2020), Acute osteomyelitis of toe of right foot (CMS/HHS-HCC) (12/11/2018), Cataract cortical, senile, Chronic kidney disease, Class 1 obesity due to excess calories in adult (11/22/2017), Coronary artery disease, Depression (2017), Diabetic retinopathy associated with type 2 diabetes mellitus (CMS/HHS-HCC), Diabetic ulcer of right great toe (CMS/HHS-HCC) (12/18/2018), Erectile dysfunction due to arterial insufficiency (01/21/2019), Heart disease, History of alcohol abuse, Hyperlipidemia associated with type 2 diabetes mellitus (CMS/HHS-HCC) (04/04/2013), Hyperlipidemia associated with type 2 diabetes mellitus (CMS/HHS-HCC), Hypertension associated with type 2 diabetes mellitus (CMS/HHS-HCC) (05/07/2012), Kidney disease, Motion sickness, Pancreatitis (HHS-HCC) (10/29/2013), Primary open angle glaucoma (POAG) of both eyes, severe stage, Tremor (2019), Type 2 diabetes mellitus with peripheral neuropathy (CMS/HHS-HCC) (01/19/2019), Type 2 diabetes mellitus with stage 3 chronic kidney disease, with long-term current use of insulin  (CMS/HHS-HCC) (05/07/2012), and Vitamin D deficiency. 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  PRECAUTIONS:   Precautions: Altered Mental Status, Falls Risk, Orthostatic hypotension   SUBJECTIVE:   Ready to be out. It's a 0/10 when I don't move it but a 10/10 when I do. Re: R shoulder DISCHARGE EVALUATION INFORMATION:     05/15/24 0801  Discipline Timestamp  Discipline Timestamp OT  Documentation Type  Documentation Type                                E,R, T   Discharge  Patient Subjective Information  Patient Subjective Information Patient agreeable to therapy  Occupational Profile/Social History  Social history source                                Patient  Patient lives                           with spouse  Admitted from Outside hospital       Outside [facility]  Number of falls in the last 3 months 0  Basic ADLs Needs assistance in all areas  Instrumental ADLs requires Assistance With: Requires assistance in all areas       Assistive device for household mobility Lobbyist type [employer] / Materials Engineer, Banker needed for household mobility Risk Analyst (Photographer)       Assistive device for community mobility Systems Analyst / Psychiatric Nurse  Wheelchair, manual  Home Activity / Exercise No  Assistance received prior to admission Yes  Receives help from [facility]  Number of days per week 7  No.of hours per day 24  Assistance available after  discharge Same as prior to admission  Additional Comments Pt reports spouse assisted him with all BADLs, IADLs, and mobility  Home Environment  Type of Home House  Home Layout One level  Access Level entry  [employer] Shower/Tub Walk-in Counselling Psychologist  Home Equipment/DME Available For Use  Bathroom Equipment Grab bars in shower;Shower chair;Grab bars around Education Officer, Community;Wheelchair       Nutritional Therapist type Manual  Precautions  Precautions Altered Mental Status;Falls Risk;Orthostatic hypotension  Pain Assessment  Pain Assessment %% 0-10  Pain Score %% Nine  Pain Type Chronic pain  Pain Loc SHOULDER  Pain Orientation Right  Pain Onset With activity  Non-Pharmacological Intervention(s) Active listening;Shower;Repositioned  Engineer, Drilling  Overall Cognitive Status Impaired/altered mental status  Arousal/Alertness Alert  Orientation X 4  Attention Span Attends with cues to redirect  Following Commands Follows one step commands  Behavior Cooperative  Safety Judgment Decreased awareness of deficits;Patient at risk for falls  Memory Impaired  Problem Solving Simple tasks;With minimal cues  Awareness of Deficits Decreased awareness of deficits  Insight Impaired  Initiation Impaired  Communication Delayed response  Visual                            Client Factors/Performance Skills  Tracking Able to track stimulus in all quadrants without difficulty  Neuro-Musculoskeletal    Client Factors/Performance Skills    RUE Assessment X  LUE Assessment WFL  Additional Comments Deficits in RUE 2/2 rotator cuff injury. Pain in RUE has worsened limiting funcional ROM  RUE Assessment  Comment 3-/5  LUE Assessment                            Comment 4/5  Coordination  Coordination Intention tremor  Intention Tremor Location LUE;RUE  Gross Motor Moderate functional impairment  Fine Motor Mild functional  impairment  Abnormal Tone Present No  Sensation No deficits  Proprioception No apparent deficits  Balance  Sitting/Standing Balance  Sitting Static;Sitting Dynamic;Standing Static       Sitting Balance Surface Hospital Bed       Static Sitting Support Needed Feet supported       Static Sitting Balance Assist Required Contact guard       Dynamic Sitting Support Needed Feet supported;Left UE       Dynamic Sitting Balance Assist Required Moderate assistance       Dynamic Sitting Activities Basic ADLs       Static Standing-Balance Support Bilateral upper extremity support       Static Standing Balance Assist Required Minimum assistance (x2)  Standing Assistive Devices Walker (x2)       Walker type Rolling  Mobility       Supine to Sit Assistance Moderate assist (pt. performs 50-75%)       Supine to Sit Details Requires extra time;Uses rails;Head of bed elevated;Management of lines and tubes;Drawsheet / pad;Right       Number of People Required 1       Sit to Supine Assistance Moderate assist (pt. performs 50-75%)  Sit to Supine Details Uses rails;Requires extra time;Verbal cues;Management of lines and tubes;Trendelenbug bed assist;Drawsheet / pad;Left       Number of People Required 2       Sit to Stand Assistance Moderate assist (pt. performs 50-75%)       Sit to Stand Details Requires extra time;Management of lines and tubes       Number of People Required 2       Sit to Stand Assistive Devices Walker       Walker type [employer] / Other Device Other (Comment) (Stedy)       Stand to Sit Assistance Moderate assist (pt. performs 50-75%)       Stand to Sit Details Verbal cues;Requires extra time;Management of lines and tubes       Number of People Required 1       Stand to [employer];Other (Comment) Laurent)       Walker type [employer] / Other Device Other (Comment) Laurent)       Transfers Dependent transfer;Stand Pivot transfer       Stand  Pivot Assistance Moderate assist (pt. performs 50-75%)       Stand Pivot Transfer Details from;hospital bed;to;wheelchair       Number of People Required 1       Stand Pivot Assistive Device None       Dependent Transfer Assistance Moderate assist (pt. performs 50-75%)       Dependent Transfer Details from;wheelchair;to;shower seat       Number of People Required 1       Dependent Transfer Assistive Device Other (Comment) Laurent)       Type of Equipment/Device Used SaraStedy/Stedy - Curator Transfers Assessed Yes  Toilet Transfers Dependent (Total Assistance)  Shower Transfers Dependent (Total Assistance)  Basic ADLs            Analysis of Occupational Performance  Overall ADL Status Minimal Assist (Pt. performs greater that 75%);Moderate Assist (Pt. performs 50-74%);Maximal Assist (Pt. performs 25-49%);Dependent (Total Assist)    Toileting Location Bed    Toileting Maximal Assist (Pt. performs 25-49%);Assist with hygiene    Toileting - Position Lying  Bathing Location Sitting;Shower  Bathing - Upper Body Moderate Assist (Pt. performs 50-74%)  Bathing - Lower Body Moderate Assist (Pt. performs 50-74%)    Dressing Location Sitting;Standing    Dressing- Upper Body Minimal Assist (Pt. perfroms greater that 75%)    UE Dressing Deficit Pull around back;Fasteners    Dressing - Lower Body Maximal Assist (Pt. performs 25-49%)    LE Dressing Deficit Requires assistace to/for;Setup;Steadying;Requires assistive device for steadying;Increased time to complete;Don/doff R sock;Don/doff L sock;Thread LLE into pants;Pull up over hips;Don/doff R shoe;Don/doff L shoe  Grooming Location Sitting  Grooming Activity Brushing teeth;Washing Face;Comb/Brushing hair  Brushing Teeth Set up assist  Washing Face Set up assist  Comb/Brushing Hair Set up assist    Feeding Position Sitting    Feeding Set up assist  OT Treatment Summary  OT Treatment Summary OT discharge, see flowsheet.  Pt received in bed, RN present giving meds. Pt agreeable to shower. ModA sup>sit and ModA SPT EOB>WC. ModA STS to [facility] for t/f wc<>Butler with management of lines. Pt required ModA for seated shower with vc to wash thoroughly. Pt required MinA for UB dressing seated in WC. Pt required MaxA to thread pants and pull up over hips standing at  RW ModA x2. Pt completed SPT WC>EOB with MaxA. Pt required MaxA x2 to boost in bed 2/2 fatigue and positioning. Pt requested time to rest following shower. Left semi-reclined in bed, all needs within reach, bed alarm on.  Patient Status At End of Session  Status Communicated to: Patient  Pt Left: semi-reclined in bed;with all needs in reach;with nurse call device in reach;with bed/chair alarm system engaged  Assessment  OT Enhancers Good family support/resources;Previous level of function/active lifestyle  OT Barriers Decreased activity tolerance/medically complex/comorbities  Impairments  Decreased strength;Decreased Basic ADLs/Self-care;Decreased endurance/activity intolerance;Decreased functional mobility;Impaired motor control;Decreased IADLs;Decreased cognition;Functional cognition;Decreased Balance  Rehab Potential  Fair  Pt.safe for Discharge/OT perspective? Yes  Summary of Findings  The patient is ready for discharge from a therapy perspective. Discharge therapy  Patient is Progressing Slowly Toward Written Goals Secondary to Medical status limitations;Decreased activity tolerance  Plan  Treatment/Interventions Basic self care;Functional mobility;Patient and family education;Bed mobility;Transfers;Therapeutic exercise;Neuromuscular Reeducation;Energy Conservation  OT Frequency 90 minutes per day at least 5 days per week  Discharge Recommendation (DUH/DRH) Home Health OT  DME Recommendations None (owned PTA)  Plan (Progress Note) Discontinue occupational therapy (MM: 6 ADL)    GOALS:   Occupational Therapy Goals     * Occupational Therapy Goals  (Active)     There are no active problems.             * Occupational Therapy Goals (Resolved)     * OT JIO:Ajuypwh     Dates: Start:  04/30/24    Resolved:  05/15/24   Disciplines: OT    * OT JIO:Ajuypwh LTG (Resolved)     Dates: Start:  04/30/24    Resolved:  05/15/24   Description: Pt will complete FB bathing from seated standing positions with Min A and AE PRN by DC.   Disciplines: OT    Outcomes     Date/Time User Outcome   05/15/24 1113 Kristie Bouchard, OT Not Met/ Adequate for Discharge      Goal Note filed on 05/15/24 1113 by Kristie Bouchard, OT     Pt completes FB bathing from seated positions with ModA and vc to wash thoroughly on this date.          * OT JIO:Ajuypwh STG (Resolved)     Dates: Start:  04/30/24    Resolved:  05/09/24   Description:  Pt will complete FB bathing from seated standing positions with Mod A and AE PRN in 1 week.   Disciplines: OT    Outcomes     Date/Time User Outcome   05/09/24 0808 Ford Kast, OT Met/ Completed      Goal Note filed on 05/09/24 0808 by Ford Kast, OT     Pt completes FB bathing from seated and standing positions (with Stedy for standing) with Mod A for buttocks, foley care, and lower legs/feet.           * OT JIO:Imzddpwh     Dates: Start:  04/30/24    Resolved:  05/15/24   Disciplines: OT    * OT JIO:Imzddpwh Upper LTG (Resolved)     Dates: Start:  04/30/24    Resolved:  05/15/24   Description: Pt will complete UBD with SBA while seated by DC.   Disciplines: OT    Outcomes     Date/Time User Outcome   05/15/24 1113 Kristie Bouchard, OT Not Met/ Adequate for Discharge      Goal Note filed on 05/15/24 1113 by Kristie Bouchard, OT  Pt requires MinA for UBD seated in WC to pull down in front and back. Pt requires assist 2/2 pain and ROM deficits with RUE from rotator cuff injury as well as LUE intention tremor.          * OT JIO:Imzddpwh Upper STG (Resolved)     Dates:  Start:  04/30/24    Resolved:  05/09/24   Description: Pt will complete UBD with Min A while seated unsupported in 1 week.   Disciplines: OT    Outcomes     Date/Time User Outcome   05/09/24 0808 Ford Kast, OT Met/ Completed      Goal Note filed on 05/09/24 0808 by Ford Kast, OT     Pt completes UBD in supported sitting in w/c with Min A to adjust clothing.         * OT JIO:Imzddpwh Lower LTG (Resolved)     Dates: Start:  04/30/24    Resolved:  05/15/24   Description: Pt will complete LBD from seated/standing positions with Mod A with AE PRN by DC.   Disciplines: OT    Outcomes     Date/Time User Outcome   05/15/24 1113 Kristie Bouchard, OT Not Met/ Adequate for Discharge      Goal Note filed on 05/15/24 1113 by Kristie Bouchard, OT     Pt completes LBD from seated/standing positions with MaxA on this date.          * OT JIO:Imzddpwh Lower STG (Resolved)     Dates: Start:  04/30/24    Resolved:  05/15/24   Description: Pt will complete LBD from seated/standing positions with Max A and AE PRN in 1 week.   Disciplines: OT    Outcomes     Date/Time User Outcome   05/15/24 1113 Kristie Bouchard, OT Met/ Completed      Goal Note filed on 05/15/24 1113 by Kristie Bouchard, OT     Pt completes LBD from seated/standing positions with MaxA on this date.            * OT JIO:Hmnnfpwh     Dates: Start:  04/30/24    Resolved:  05/09/24   Disciplines: OT    * OT JIO:Hmnnfpwh LTG (Resolved)     Dates: Start:  04/30/24    Resolved:  05/09/24   Description: Pt will complete grooming tasks with SU of supplies while seated at sink by DC.   Disciplines: OT    Outcomes     Date/Time User Outcome   05/09/24 0824 Ford Kast, OT Met/ Completed      Goal Note filed on 05/09/24 9175 by Ford Kast, OT     Pt completes simple grooming tasks with SU of supplies while seated in w/c at sink.           * OT JIO:Unpozupwh     Dates:  Start:  04/30/24    Resolved:  05/14/24   Disciplines: OT    * OT JIO:Unpozupwh LTG (Resolved)     Dates: Start:  04/30/24    Resolved:  05/14/24   Description: Pt will complete clothing management and perineal hygiene while toileting with Min A by DC.   Disciplines: OT    Outcomes     Date/Time User Outcome   05/14/24 1447 Ford Kast, OT Not Met/ Adequate for Discharge      Goal Note filed on 05/14/24 1447 by Ford Kast, OT     Pt continues with indwelling foley and bowel incontinence, DC  goal.         * OT JIO:Unpozupwh STG (Resolved)     Dates: Start:  04/30/24    Resolved:  05/14/24   Description: Pt will complete clothing management and perineal hygiene while toileting with Mod A in 1 week.   Disciplines: OT    Outcomes     Date/Time User Outcome   05/14/24 1447 Ford Kast, OT Not Met/ Adequate for Discharge      Goal Note filed on 05/14/24 1447 by Ford Kast, OT     Pt continues with indwelling foley and bowel incontinence, DC goal.            ASSESSMENT:  Upon admission to rehab Troy Morse was functioning with maximal assistance-total assistance for BADLs. Patient made Fair   progress while in house and patient's course was complicated by orthostatic hypotension, pain, illness, and bowel/bladder management. Troy Morse was primarily limited by pain in RUE, cognition, and chronic CVA deficits. OT sessions focused on sitting balance, functional transfers, ADL strategies, and standing tolerance. Goals were met or adequate for discharge.  At discharge, the patient is functioning setupA for grooming, minimal assistance for UBD, maximal assistance for LBD, moderate assistance for bathing, and maxA-totalA for toileting 2/2 incontinence and indwelling foley. Recommending HHOT for follow up needs. All DME was owned prior to admission. PLAN:   Refer to Flowsheet information above for Discharge recommendations.  DME was owned prior  to admission. No further skilled OT needs are indicated at the acute rehabilitation level at this time.  Patient is safe to discharge with needs as listed above. ADDITIONAL INFORMATION:   On 05/15/2024,  has discussed with patient/family/caregiver if they have the help they will need when they leave the hospital  PAC-QI flowsheet completed: Yes MEGAN  MANN, OT  *Some images could not be shown."

## 2024-05-15 NOTE — Progress Notes (Signed)
" °  [facility] Department of Occupational Therapy CONTACT NOTE  DATE: 05/15/2024 PATIENT NAME: Troy Morse Pt missed initial 8 minutes of therapy 2/2 RN giving meds. Pt missed final 20 minutes of therapy 2/2 fatigue following shower. Will follow up per OT POC.  MEGAN  MANN, OT  "

## 2024-05-15 NOTE — Discharge Summary (Signed)
 "   Discharge Summary  Admit Date: 04/29/2024 Discharge Date: 05/15/2024  Admitting Physician: Beatriz Toribio Dural, MD Discharge Physician: Dural Beatriz Toribio, MD  Primary Care Provider: [name], MD, Phone [phone]  Discharge Destination: Home  Admission Diagnoses:  Debility s/t DKA  Discharge Diagnoses:  Principal Problem:   History of multiple strokes Active Problems:   Abnormal stress echocardiogram   Hypertension associated with type 2 diabetes mellitus (CMS/HHS-HCC)   Family history of coronary arteriosclerosis   CKD (chronic kidney disease) stage 3, GFR 30-59 ml/min (CMS-HCC)   Diabetic polyneuropathy associated with type 2 diabetes mellitus (CMS/HHS-HCC)   Adjustment reaction   High risk medication use   Numbness and tingling in left hand   Numbness and tingling of left leg   Cirrhosis of liver without ascites (CMS/HHS-HCC)   Abnormal liver diagnostic imaging   Class 1 obesity due to excess calories in adult   Benign prostatic hyperplasia with lower urinary tract symptoms   Diabetic macular edema (CMS/HHS-HCC)   Cerebrovascular accident (CVA) due to thrombosis of cerebral artery (CMS/HHS-HCC)   DKA (diabetic ketoacidosis) (CMS/HHS-HCC)   Acute pulmonary embolism without acute cor pulmonale (CMS/HHS-HCC) Resolved Problems:   Drug-induced constipation   AKI (acute kidney injury)  Primary Diagnosis: Admitted for admitted for debility after hospitalization of forDKA. A1c on admission was 10.9.   Changes Made (with rationale):   He has been followed. Endocrine and blood glucose has been managed.He will DC on 20 units of lantus  at hs and 2-4 units of lispro with meals Flomax was stopped 2/2 hypotension Failed TOV UTI -nitrofurantoinx 7 days  To-Do List (incidental findings, follow-up studies, etc.): Referral to urology for urinary retention Complete Nitrofuantoin  Anticipatory Guidance for Outpatient Care:  DKA. A1c on admission was 10.9.  Fu  with PCP for glucose control     Results Pending at Discharge:  None Please see phone numbers at end of this summary for lab contact information.   Follow-up/Care Transition Plan: Sched. appts: Future Appointments  Date Time Provider Department Center  05/15/2024  2:00 PM Earla Domino, [place] [facility]  05/19/2024  4:40 PM [name], MD [facility]  06/06/2024  3:30 PM Cherilyn Debby Quivers, MD [facility]  06/25/2024  1:00 PM Skeet Marcey Heinz, MD [facility]  07/15/2024  4:00 PM Bevely Donnice [place], MD [facility] [place]    Follow-up info: No follow-up provider specified.     Allergies/Intolerances:  No Known Allergies   New Adverse Drug Events: hypotension with flowmax  Medications:     Current Discharge Medication List     START taking these medications      Instructions  cyanocobalamin  1000 MCG tablet Quantity: 30 tablet Refills: 1  Commonly known as: VITAMIN B12 Take 1 tablet (1,000 mcg total) by mouth once daily Last time this was given: 1,000 mcg on May 15, 2024  8:01 AM   diclofenac 1 % topical gel Quantity: 100 g Refills: 1  Commonly known as: VOLTAREN Apply 2 g topically 4 (four) times daily Last time this was given: 2 g on May 10, 2024  9:41 AM   insulin  LISPRO pen injector (concentration 100 units/mL) Quantity: 15 mL Refills: 1 Replaces: insulin  LISPRO injection (concentration 100 units/mL)  Commonly known as: HumaLOG KWIKPEN Give with meals if blood glucose is < 120 give 2 units, If blood glucose is > 120 give 4 units Last time this was given: Ask your nurse or doctor   lactobacil  rhamnosus (GG)-inulin 10 billion cell - 200 mg capsule Quantity: 30 capsule Refills: 1  Commonly known as: CULTURELLE Take 1 capsule by mouth once daily Last time this was given: 1 capsule on May 15, 2024  8:02 AM   loperamide 2 mg capsule Quantity: 30 capsule Refills: 0 Stop taking on: May 25, 2024  Commonly known as: IMODIUM Take 2 capsules (4 mg total) by mouth 4 (four) times daily as needed for Diarrhea for up to 10 days Last time this was given: 4 mg on May 15, 2024 12:30 PM   midodrine 2.5 MG tablet Quantity: 90 tablet Refills: 1  Commonly known as: PROAMATINE Take 1 tablet (2.5 mg total) by mouth 3 (three) times daily with meals Last time this was given: 2.5 mg on May 15, 2024 12:30 PM   nitrofurantoin (macrocrystal-monohydrate) 100 MG capsule Quantity: 6 capsule Refills: 0 Stop taking on: May 17, 2024  Commonly known as: MACROBID Take 1 capsule (100 mg total) by mouth 2 (two) times daily for 3 days Last time this was given: 100 mg on May 15, 2024  8:02 AM       These medications have been CHANGED      Instructions  apixaban  5 mg tablet Quantity: 60 tablet Refills: 1 What changed: See the new instructions.  Commonly known as: ELIQUIS  Take 1 tablet (5 mg total) by mouth every 12 (twelve) hours Last time this was given: 5 mg on May 15, 2024  8:02 AM   insulin  glargine-yfgn injection (concentration 100 units/mL) Quantity: 15 mL Refills: 1 What changed:  medication strength how much to take  Commonly known as: SEMGLEE (INSULIN  GLARG-YFGN)PEN Inject 0.2 mLs (20 Units total) subcutaneously at bedtime Last time this was given: 24 Units on May 14, 2024  9:06 PM   metFORMIN  500 MG XR tablet Quantity: 60 tablet Refills: 1 What changed:  medication strength See the new instructions.  Commonly known as: GLUCOPHAGE -XR Take 2 tablets (1,000 mg total) by mouth daily with dinner Last time this was given: 1,000 mg on May 14, 2024  4:58 PM   pantoprazole  40 MG DR tablet Quantity: 30 tablet Refills: 1 What changed: See the new instructions.  Commonly known as: PROTONIX  Take 1 tablet (40 mg total) by mouth once daily Last time this was given: 40 mg on May 15, 2024  8:02 AM   sertraline  50 MG tablet Quantity: 90  tablet Refills: 1 What changed: medication strength  Commonly known as: ZOLOFT  Take 3 tablets (150 mg total) by mouth once daily Last time this was given: 150 mg on May 15, 2024  8:02 AM       CONTINUE taking these medications      Instructions  aspirin  81 mg tablet Quantity: 30 tablet Refills: 1  Take 1 tablet (81 mg total) by mouth once daily Last time this was given: Ask your nurse or doctor   atorvastatin  80 MG tablet Quantity: 90 tablet Refills: 3  Commonly known as: LIPITOR Take 1 tablet (80 mg total) by mouth once daily Last time this was given: 80 mg on May 15, 2024  8:01 AM   cholecalciferol  2,000 unit tablet Quantity: 30 tablet Refills: 1  Commonly known as: VITAMIN D3 Take 1 tablet (2,000 Units total) by mouth once daily Last time this was given: 2,000 Units on May 15, 2024  8:01 AM   clonazePAM  0.5 MG tablet Quantity: 60 tablet Refills: 5  Commonly known as: KlonoPIN  Take 1 tablet (0.5 mg total)  by mouth 2 (two) times daily for 180 days Last time this was given: 0.5 mg on May 15, 2024  8:05 AM   FREESTYLE LIBRE 3 PLUS SENSOR Devi Quantity: 2 each Refills: 1  Use 1 each every 15 (fifteen) days   gabapentin  300 MG capsule Quantity: 60 capsule Refills: 1  Commonly known as: NEURONTIN  Take 1 capsule (300 mg total) by mouth 2 (two) times daily Last time this was given: 300 mg on May 15, 2024  8:02 AM   ketorolac  0.5 % ophthalmic solution Quantity: 10 mL Refills: 1  Commonly known as: ACULAR  Place 1 drop into the right eye 4 (four) times daily Last time this was given: 1 drop on May 15, 2024 12:35 PM   multivitamin 400 mcg tablet Quantity: 30 tablet Refills: 1  Take 1 tablet by mouth once daily Last time this was given: 1 tablet on May 15, 2024  8:02 AM   ondansetron  4 MG disintegrating tablet Quantity: 20 tablet Refills: 1  Commonly known as: ZOFRAN -ODT Take 1 tablet (4 mg total) by mouth every 8 (eight) hours as  needed for Nausea   pen needle, diabetic 31 gauge x 3/16 needle Quantity: 400 each Refills: 12  Commonly known as: BD ULTRA-FINE MINI PEN NEEDLE 4 (four) times daily       STOP taking these medications    finasteride  5 mg tablet Commonly known as: PROSCAR    insulin  LISPRO injection (concentration 100 units/mL) Commonly known as: AdmeLOG, HumaLOG Replaced by: insulin  LISPRO pen injector (concentration 100 units/mL)   polyethylene glycol packet Commonly known as: MIRALAX    sennosides-docusate 8.6-50 mg tablet Commonly known as: SENOKOT-S   tamsulosin 0.4 mg capsule Commonly known as: FLOMAX         Brief History of Present Illness:  Troy Morse is a 63 y.o. male patient with history of multiple CVA (with residual aphasia and R sided deficits), T2DM (A1c 9,2 from 01/02/2024), c/b retinopathy, neuropathy, nephropathy, R foot ulcer s/p amputation, h/o PE (11/22/23) on Eliquis , MASLD, CKD3, depression, chronic pancreatitis  was admitted 04/18/24-04/30/23  in DKA. A1c on admission was 10.9. Hospital course was complicated by urinary retention, Foley was required to be placed by urology.  Urinary tract infection, residual aphasia and right sided weakness, CKD depression history of PE diabetic retinopathy cataracts, essential tremor  _____________________   Hospital Course by Problem:  Debility # ADL/mobility dysfunction - PT/OT/ST with goals of maximizing functional independence.   #DKA #Type 2 diabetes C/B peripheral neuropathy #HgbA1c 10.9 -Lantus  titrate (50u) -Lispro titrate (12u) - ACHS + SSI; appreciate endocrine team assisting -Follow-up endocrinology 06/06/2024   #Hx CVA's-11/24, 1/25, 5/25, 7/25 #Left corona radiata infarct #Right occipital infarct #Left thalamic infarct #Bilateral cerebellar infarcts -Residual aphasic -Residual right-sided weakness -ASA --Atorvastatin    #Urinary retention-required Foley in acute #Dysuria #Cultures positive for E  faecalis -Treated with ampicillin then nitrofurantoin urine  -Foley discontinued at discharge -Foley replaced 11/20 -Flomax stopped -Sat on foley during therapy-caused trauma 11/26 -Replace foley 11/26 with coude catheter ,-UA C&S with >100k E.coli; switch amoxicillin  to macrobid BID x 7d (11/29-).  -urine output looks good/clear yellow   #CKD 3 -Baseline creatinine 1.1-1.2 -Required IV fluids in hospital - creat up to 1.7 and returned back to 1.2     #Depression -Sertraline    #Hx PE # DVT PX -Apixaban    #Diabetic retinopathy #Cataracts -Ketorolac  GGT S   #Essential tremor -Clonazepam    #Malnutrition -Oral nutrition -Supplementation   # pain  -APAP -Controlled   #  Orthostatic hypotension -RRT called 11/20 -stopped flomax -recovered with IVF -orthostatic vs -TEDs -ABD binder -Midodrine titrated 5 mg tid now reduced at 2.5mg  tid   #Abd pain -patient has hx pancreatitis-check amylase and lipase with AM labs as well as LFTs   #Diarrhea - C diff was negative -change bowel meds to prn - culturelle -immodium qid prn -required IVF for fluid losses       _____________________  Discharge Exam:  BP 129/79 (BP Location: Left upper arm, Patient Position: Lying)   Pulse 80   Temp 36.7 C (98 F) (Axillary)   Resp 16   Ht 177.8 cm (5' 10)   Wt 84.6 kg (186 lb 8.2 oz)   SpO2 97%   BMI 26.76 kg/m    Hzw:dont in speech, alert, cooperative and no distress HEENT:EOMI, clear conjunctiva, MMM Lungs: unlab breaths on room air,  no audible wheezing CV: No LE edema calves soft Abdomen: soft, non-tender; ND MSK: joints without tenderness or swelling, normal muscle bulk Neurologic: decreased initiation Skin: Skin color, texture normal. No rashes or lesions Psych: Pleasant affect, calm and cooperative    Pertinent Lab Testing: Recent Labs  Lab 05/13/24 0546 05/15/24 0421  NA 140 140  K 3.8 4.0  CL 106 110*  CO2 21 23  BUN 31* 25*  CREATININE 1.3 1.2   GLUCOSE 97 87  CALCIUM  9.8 9.2   Recent Labs  Lab 05/13/24 0546 05/15/24 0421  AST 20 15  ALT 19 15  ALKPHOS 56 53  TBILI 0.6 0.7    Recent Labs  Lab 05/12/24 0526 05/13/24 1342 05/15/24 0421  WBC 6.8 9.6 6.7  HGB 12.9* 12.7* 12.2*  HCT 39.0 39.0 38.4*  PLT 159 216 163   No results for input(s): APTT, INR in the last 168 hours.   Other Pertinent Labs:    Micro:  No results found for this visit on 04/29/24.    Pertinent Imaging:   X-ray abdomen 3 way series includes 1 view chest Result Date: 05/13/2024 EXAM: Abdominal series INDICATION: diarrhea, Z86.73 Personal history of transient ischemic attack (TIA), and cerebral infarction without residual deficits, E11.65 Type 2 diabetes mellitus with hyperglycemia (CMS/HHS-HCC), E11.8 Type 2 diabetes mellitus with unspecified complications (CMS/HHS-HCC). COMPARISON: 01/02/2024 chest x-ray. Abdomen and pelvis CT dated 11/16/2023. TECHNIQUE: Supine and erect or decubitus views of the abdomen and a frontal view of the chest were performed. FINDINGS: Bowel gas pattern is nonobstructive. No free air is seen. There is scattered stool of the left side of the colon. There are phleboliths and vascular calcifications of the pelvis. There are degenerative changes of the lumbar spine. Overall bone density is normal. A frontal view of the chest reveals a normal heart size. Lungs are clear without evidence of acute disease. IMPRESSION: 1.  Nonobstructive bowel gas pattern and no free air. 2.  No acute cardiopulmonary abnormality. Electronically Signed by:  Rosina Gold, MD, [facility] Radiology Electronically Signed on:  05/13/2024 2:11 PM  _____________________  Code Status: Full Code Goals of care were not addressed during this admission.   Status on Discharge:  Current activity: Chairfast (05/14/24 2100) Current mobility: Slightly limited (05/14/24 2100)  Activity Recommendation: activity as tolerated  Other Discharge Instructions: Services  setup at discharge: Home Health PT/OT, Nurse, Aide Tubes/lines at discharge: foley- Failed TOV  Diet: Diet carbohydrate level 3 (75 gm/meal) Oral Supplements - Adult All Supplements; Boost Glucose Control-Assorted; Breakfast and Dinner  Wound Care Order Instructions     None  _____________________  Time spent on discharge process: 45 minutes    DEVERE KANDICE EDISON, NP   05/15/2024   Hospital Contact Information:  [place] [facility]) [facility]) [facility]) [facility])  Pending tests:  Laboratory: [phone] Microbiology: [phone] Pathology: [phone] Radiology: [phone]  General questions: [phone] Pending tests: Laboratory: [phone] Microbiology: [phone] Pathology: [phone] Radiology: [phone]  General questions:  [phone] Pending tests:  Laboratory: [phone] Microbiology: [phone] Pathology: [phone] Radiology: [phone]  General questions:  [phone] Pending tests: Laboratory: [phone]) [phone] Microbiology: [phone] Pathology: [phone] Radiology: [phone]  General questions:  [phone]    "

## 2024-05-28 NOTE — Progress Notes (Signed)
 Referring Provider: Rudy Troy Hamilton, MD  PCP: Troy Troy Hamilton, MD   CC: No chief complaint on file.    HPI Troy Morse is a 63 y.o. male who presents today in consultation of Troy Morse Rudy, MD for urinary retention. Was hospitalized from 04/18/24-04/29/24 for DKA. Found to be in urinary retention 04/18/24, nursing unable to place Foley. Urology consulted with placement of coude tip catheter without issue. Culture grew e. Faecalis. Treated with ampicillin. Passed TOV during admission. Started on Flomax.   Discharged to rehab facility -  11/185/25-05/15/24. Foley was reinserted on 11/20. Flomax was stopped 2/2 hypotension. Sat on Foley during therapy --> trauma, foley replaced 11/26 with coude catheter. Urine culture >100K e coli 05/10/24. Treated with macrobid. Failed TOV. Discharged with Foley.   Had catheter in July 20025 admission as well. Had UTI that lead to DKA. Another CVA at that time.   Just recently after admission had UTI again - taking Cipro today is last day. Urine dark and cloudy.  Urine output decreasing over the last few days. Drinking 20 ounces water about every hour. No swelling. No fevers.   Before first UTI in July had dysuria. Not verbalizing any symptoms prior to most recent hospitalization.   Catheter last exchanged was 05/07/24, had significant difficulty replacing.   No history of prostate issues previously. No   rUTIs started after the strokes.   No problems with constipation.  Still taking medication to help control his hypotension.   Past Medical History:  Diagnosis Date   Abnormal liver diagnostic imaging 05/26/2020   Acute osteomyelitis of toe of right foot (CMS/HHS-HCC) 12/11/2018   Culture + for Pseudomonas aeruginosa; S/P amputation by Dr. Ashley 03/21/2019   Cataract cortical, senile    bilateral   Chronic kidney disease    Class 1 obesity due to excess calories in adult 11/22/2017   Coronary artery disease    Depression  2017   Diabetic retinopathy associated with type 2 diabetes mellitus (CMS/HHS-HCC)    Diabetic ulcer of right great toe (CMS/HHS-HCC) 12/18/2018   Last Assessment & Plan:  Pt presents for a hospital follow up of a diabetic ulcer on his right great toe. Was admitted at [facility] from 12/11/18 - 12/15/18. Had been following with podiatry prior to admission. Received IV rocephin  and vancomycin , discharged on oral augmentin  10-14 day course. Reports the toe is feeling much better. His wife has been cleaning the wound and dress   Erectile dysfunction due to arterial insufficiency 01/21/2019   Treated with Trimix intracavernosal injections from Dr. Twylla   Heart disease    History of alcohol abuse    [age] years old; no H/O DUI   Hyperlipidemia associated with type 2 diabetes mellitus (CMS/HHS-HCC) 04/04/2013   Hyperlipidemia associated with type 2 diabetes mellitus (CMS/HHS-HCC)    Hypertension associated with type 2 diabetes mellitus (CMS/HHS-HCC) 05/07/2012   Kidney disease    Motion sickness    Pancreatitis (HHS-HCC) 10/29/2013   another episode 2017 attributed to Victoza; 3 all together related to medication   Primary open angle glaucoma (POAG) of both eyes, severe stage    Tremor 2019   Type 2 diabetes mellitus with peripheral neuropathy (CMS/HHS-HCC) 01/19/2019   Type 2 diabetes mellitus with stage 3 chronic kidney disease, with long-term current use of insulin  (CMS/HHS-HCC) 05/07/2012   Vitamin D deficiency     Past Surgical History:  Procedure Laterality Date   TONSILLECTOMY  1968   BACK SURGERY  1986  Dr. Kathi   VITREOUS RETINAL SURGERY Left 02/17/2010   PPV/ RD REPAIR/ AIR BUBBLE OS   LAPAROSCOPIC CHOLECYSTECTOMY  2018   UMBILICAL HERNIA REPAIR N/A 07/13/2016   with re-do soon after   Amputation right first toe Right 03/21/2019   For osteomyelitis, by Dr. Ashley   REPAIR COLLATERAL LIGAMENT METACARPOPHALANGEAL/INTERPHALANGEAL JOINT  Right 05/06/2019   Procedure: REPAIR OF COLLATERAL LIGAMENT, METACARPOPHALANGEALJOINT;  Surgeon: Troy Morse Pac, MD;  Location: [facility] OR;  Service: Orthopedics;  Laterality: Right;   INTRAOPERATIVE FLUOROSCOPY Right 05/06/2019   Procedure: FLUOROSCOPY (SEPARATE PROCEDURE), UP TO 1 HOUR PHYSICIAN OR OTHER QUALIFIED HEALTH CARE PROFESSIONAL TIME;  Surgeon: Troy Morse Pac, MD;  Location: [facility] OR;  Service: Orthopedics;  Laterality: Right;   COLONOSCOPY  06/2019   no polyps found   EXTRACTION CATARACT INTRACAPSULAR W/INSERTION INTRAOCULAR PROSTHESIS Left 10/07/2019   Procedure: EXTRACAPSULAR CATARACT ECCE REMOVAL WITH INSERTION OF INTRAOCULAR LENS PROSTHESIS (1 STAGE PROCEDURE), MANUAL OR MECHANICAL TECHNIQUE;  Surgeon: Troy Trinna Everitt Delene Sheria, MD;  [facility];  Service: Ophthalmology;  Laterality: Left;   GLAUCOMA EYE SURGERY Left 10/07/2019   Procedure: KERMIT DUAL BLADE (KDB) GONIOTOMY;  Surgeon: Troy Trinna Everitt Delene Sheria, MD;  [facility];  Service: Ophthalmology;  Laterality: Left;   ESOPHAGOGASTRODOUDENOSCOPY W/ULTRASOUND EXAMINATION N/A 09/25/2023   Procedure: Upper Endoscopic Ultrasound;  Surgeon: Troy Morse, Troy Caldron, MD;  Location: [facility];  Service: Gastroenterology;  Laterality: N/A;   ESOPHAGOGASTRODOUDENOSCOPY W/BIOPSY N/A 09/25/2023   Procedure: ESOPHAGOGASTRODUODENOSCOPY, FLEXIBLE, TRANSORAL; WITH BIOPSY, SINGLE OR MULTIPLE;  Surgeon: Troy Morse, Troy Caldron, MD;  Location: [facility];  Service: Gastroenterology;  Laterality: N/A;   CHOLECYSTECTOMY     CIRCUMCISION NEWBORN     HERNIA REPAIR     Laser surgery of retina Left    right hand ligament tear     pin in right hand from ligament repair   SPINE SURGERY      Social History   Tobacco Use   Smoking status: Never   Smokeless tobacco: Never   Tobacco comments:    none  Vaping Use   Vaping status: Never Used  Substance Use Topics    Alcohol use: Not Currently    Alcohol/week: 0.0 standard drinks of alcohol   Drug use: Never    Family History  Problem Relation Name Age of Onset   Fuch's dystrophy Mother Troy Morse    COPD Mother Kmarion Rawl    Tremor Mother Keiyon Plack    Diabetes Father Iven Earnhart        Died   High blood pressure (Hypertension) Father Amaree Leeper    Stroke Father Krew Hortman    Kidney disease Father Daquon Greenleaf    Myocardial Infarction (Heart attack) Father Ralston Venus    Alcohol abuse Brother     Coronary Artery Disease (Blocked arteries around heart) Brother         S/P CABG 51 years old   Drug abuse Brother     Diabetes Maternal Grandmother     Cancer Maternal Grandfather     Diabetes Paternal Grandmother     Blindness Paternal Grandmother     Coronary aneurysm Paternal Grandmother     Drug abuse Son     Glaucoma Neg Hx     Macular degeneration Neg Hx     Retinal degeneration Neg Hx     Prostate cancer Neg Hx     Colon cancer Neg Hx     Colon polyps Neg Hx  Anesthesia problems Neg Hx      Current Outpatient Medications  Medication Sig Dispense Refill   apixaban  (ELIQUIS ) 5 mg tablet Take 1 tablet (5 mg total) by mouth every 12 (twelve) hours 60 tablet 1   aspirin  81 mg tablet Take 1 tablet (81 mg total) by mouth once daily 30 tablet 1   atorvastatin  (LIPITOR) 80 MG tablet Take 1 tablet (80 mg total) by mouth once daily 90 tablet 3   blood-glucose sensor (FREESTYLE LIBRE 3 PLUS SENSOR) Devi Use 1 each every 15 (fifteen) days 2 each 1   cholecalciferol  (VITAMIN D3) 2,000 unit tablet Take 1 tablet (2,000 Units total) by mouth once daily 30 tablet 1   ciprofloxacin HCl (CIPRO) 250 MG tablet Take 1 tablet (250 mg total) by mouth 2 (two) times daily for 7 days 14 tablet 0   clonazePAM  (KLONOPIN ) 0.5 MG tablet Take 1 tablet (0.5 mg total) by mouth 2 (two) times daily for 180 days 60 tablet 5   cyanocobalamin  (VITAMIN B12) 1000 MCG tablet Take 1  tablet (1,000 mcg total) by mouth once daily 30 tablet 1   diclofenac (VOLTAREN) 1 % topical gel Apply 2 g topically 4 (four) times daily 100 g 1   gabapentin  (NEURONTIN ) 300 MG capsule Take 1 capsule (300 mg total) by mouth 2 (two) times daily 60 capsule 1   insulin  glargine-yfgn (SEMGLEE ,INSULIN  GLARG-YFGN,PEN) injection (concentration 100 units/mL) Inject 0.2 mLs (20 Units total) subcutaneously at bedtime 15 mL 1   insulin  LISPRO (HUMALOG KWIKPEN) pen injector (concentration 100 units/mL) Give with meals if blood glucose is < 120 give 2 units, If blood glucose is > 120 give 4 units 15 mL 1   ketorolac  (ACULAR ) 0.5 % ophthalmic solution Place 1 drop into the right eye 4 (four) times daily 10 mL 1   lactobacil rhamnosus, GG,-inulin (CULTURELLE) 10 billion cell - 200 mg capsule Take 1 capsule by mouth once daily 30 capsule 1   metFORMIN  (GLUCOPHAGE -XR) 500 MG XR tablet Take 2 tablets (1,000 mg total) by mouth daily with dinner 60 tablet 1   midodrine (PROAMATINE) 2.5 MG tablet Take 1 tablet (2.5 mg total) by mouth 3 (three) times daily with meals 90 tablet 1   multivitamin 400 mcg tablet Take 1 tablet by mouth once daily 30 tablet 1   ondansetron  (ZOFRAN -ODT) 4 MG disintegrating tablet Take 1 tablet (4 mg total) by mouth every 8 (eight) hours as needed for Nausea 20 tablet 1   pen needle, diabetic (BD ULTRA-FINE MINI PEN NEEDLE) 31 gauge x 3/16 needle 4 (four) times daily 400 each 12   sertraline  (ZOLOFT ) 50 MG tablet Take 3 tablets (150 mg total) by mouth once daily 90 tablet 1   No current facility-administered medications for this visit.    No Known Allergies   EXAM Constitutional: alert, interactive with provider, cooperative, in no distress Mental status: oriented x 3, good historian, appropriate mood and behavior, thought content appears normal Respiratory: no respiratory distress, no audible wheezing   LABORATORY I personally reviewed and reviewed with the patient the  following: Lab Results  Component Value Date   TPCRERATIO 115 03/24/2020   PROTEINUA Trace (!) 05/21/2024   NITRITE Positive (!) 05/21/2024   LEUKOCYTESUR 2+ (!) 05/21/2024   RBCUA 38 (H) 05/07/2024   WBCUA 156 (H) 05/07/2024   SQUAMEPI 0 05/07/2024   BACTERIA None Seen 01/21/2019   Lab Results  Component Value Date   URCULT >=100,000 CFU/mL Escherichia coli (!) 05/21/2024  URCULT >=100,000 CFU/mL Escherichia coli (!) 05/07/2024   URCULT >=100,000 CFU/mL Enterococcus faecalis (!) 04/19/2024    Lab Results  Component Value Date   NA 140 05/15/2024   K 4.0 05/15/2024   CL 110 (H) 05/15/2024   CO2 23 05/15/2024   BUN 25 (H) 05/15/2024   CREATININE 1.2 05/15/2024   GLUCOSE 87 05/15/2024   CALCIUM  9.2 05/15/2024   ANIONGAP 7 05/15/2024   BUNCRE 20 05/15/2024   GFR 68 05/15/2024    PSA (Prostate Specific Antigen), Total  Date Value Ref Range Status  11/08/2021 0.84 <=2.99 ng/mL Final    Comment:    [facility] PSA Screening algorithm, based on a multi-disciplinary consensus panel review of best reported practice in the literature. All recommendations and treatment decisions should be made in conjunction with the patient after discussion and counseling.  If PSA >= 3.0 ng/ml, consider referral to Urology If PSA <  3.0 ng/ml, consider screening every two years  [employer] Total-PSA Method:  The measured value of this analyte can vary depending upon the testing procedure used. Values determined on patient samples by differing testing procedures cannot be directly compared with one another, and could be cause of erroneous medical interpretation.  ]  RADIOLOGY I personally reviewed and reviewed with the patient the following: CT a/p 11/16/23  No kidney stones or hydronephrosis. Normal appearance of bladder, no catheter in place.    ASSESSMENT 1. Urinary retention  2. Recurrent UTI   He has several risk factors for urinary retention including multiple CVA,  DM, possible BPH. However, may have had active UTI at time of retention contributing. Initially had Foley placed with UTI during July 2025 admission. We discussed option of another TOV in the office versus continuing with indwelling catheter. Due to multiple issues with catheter placement during hospitalization, rehab they are not comfortable with trial of CIC. They would like to keep the catheter in place through the holidays and come in for TOV after that. He will finish course of Cipro today for recent UTI. Urine remains cloudy, but no other symptoms of UTI. Discussed s/s to look out for including lower abdominal pain, leaking around catheter, malaise, fever, chills.   PLAN: Contact Home health RN GLENWOOD Mar regarding additional catheter bag, stat lock supplies. She will order these today.  Will return on 06/16/24 for TOV    Attestation Statement:   I personally performed the service, non-incident to. [facility])   SARAH COLEMAN LUNGER, NP   This video encounter was conducted with the patient's (or proxy's) verbal consent via secure, interactive audio and video telecommunications while away from clinic/office/hospital.  The patient (or proxy) was instructed to have this encounter in a suitably private space and to only have persons present to whom they give permission to participate. In addition, patient identity was confirmed by use of name plus two identifiers.  This visit was coded based on medical decision making (MDM).    Attestation Statement:   I personally performed the service, non-incident to. [facility])   SARAH COLEMAN LUNGER, NP

## 2024-06-17 ENCOUNTER — Encounter: Payer: Self-pay | Admitting: Emergency Medicine

## 2024-06-17 ENCOUNTER — Other Ambulatory Visit: Payer: Self-pay

## 2024-06-17 ENCOUNTER — Inpatient Hospital Stay
Admission: EM | Admit: 2024-06-17 | Discharge: 2024-06-27 | DRG: 698 | Disposition: A | Discharge status: Skilled Nursing Facility

## 2024-06-17 ENCOUNTER — Emergency Department

## 2024-06-17 DIAGNOSIS — Z7982 Long term (current) use of aspirin: Secondary | ICD-10-CM

## 2024-06-17 DIAGNOSIS — Z1624 Resistance to multiple antibiotics: Secondary | ICD-10-CM | POA: Diagnosis present

## 2024-06-17 DIAGNOSIS — I2510 Atherosclerotic heart disease of native coronary artery without angina pectoris: Secondary | ICD-10-CM | POA: Diagnosis present

## 2024-06-17 DIAGNOSIS — T83511A Infection and inflammatory reaction due to indwelling urethral catheter, initial encounter: Principal | ICD-10-CM | POA: Diagnosis present

## 2024-06-17 DIAGNOSIS — Z86711 Personal history of pulmonary embolism: Secondary | ICD-10-CM

## 2024-06-17 DIAGNOSIS — G9341 Metabolic encephalopathy: Secondary | ICD-10-CM | POA: Diagnosis present

## 2024-06-17 DIAGNOSIS — Z7901 Long term (current) use of anticoagulants: Secondary | ICD-10-CM

## 2024-06-17 DIAGNOSIS — Z794 Long term (current) use of insulin: Secondary | ICD-10-CM

## 2024-06-17 DIAGNOSIS — N390 Urinary tract infection, site not specified: Principal | ICD-10-CM | POA: Diagnosis present

## 2024-06-17 DIAGNOSIS — F411 Generalized anxiety disorder: Secondary | ICD-10-CM | POA: Diagnosis present

## 2024-06-17 DIAGNOSIS — I129 Hypertensive chronic kidney disease with stage 1 through stage 4 chronic kidney disease, or unspecified chronic kidney disease: Secondary | ICD-10-CM | POA: Diagnosis present

## 2024-06-17 DIAGNOSIS — N1831 Chronic kidney disease, stage 3a: Secondary | ICD-10-CM | POA: Diagnosis present

## 2024-06-17 DIAGNOSIS — Y846 Urinary catheterization as the cause of abnormal reaction of the patient, or of later complication, without mention of misadventure at the time of the procedure: Secondary | ICD-10-CM | POA: Diagnosis present

## 2024-06-17 DIAGNOSIS — Z6826 Body mass index (BMI) 26.0-26.9, adult: Secondary | ICD-10-CM

## 2024-06-17 DIAGNOSIS — E663 Overweight: Secondary | ICD-10-CM | POA: Diagnosis present

## 2024-06-17 DIAGNOSIS — N401 Enlarged prostate with lower urinary tract symptoms: Secondary | ICD-10-CM | POA: Diagnosis present

## 2024-06-17 DIAGNOSIS — N183 Chronic kidney disease, stage 3 unspecified: Secondary | ICD-10-CM | POA: Diagnosis present

## 2024-06-17 DIAGNOSIS — R531 Weakness: Principal | ICD-10-CM

## 2024-06-17 DIAGNOSIS — Z9049 Acquired absence of other specified parts of digestive tract: Secondary | ICD-10-CM

## 2024-06-17 DIAGNOSIS — Z79899 Other long term (current) drug therapy: Secondary | ICD-10-CM

## 2024-06-17 DIAGNOSIS — B9620 Unspecified Escherichia coli [E. coli] as the cause of diseases classified elsewhere: Secondary | ICD-10-CM | POA: Diagnosis present

## 2024-06-17 DIAGNOSIS — E1122 Type 2 diabetes mellitus with diabetic chronic kidney disease: Secondary | ICD-10-CM | POA: Diagnosis present

## 2024-06-17 DIAGNOSIS — I10 Essential (primary) hypertension: Secondary | ICD-10-CM | POA: Diagnosis present

## 2024-06-17 DIAGNOSIS — Q2112 Patent foramen ovale: Secondary | ICD-10-CM

## 2024-06-17 DIAGNOSIS — Z8249 Family history of ischemic heart disease and other diseases of the circulatory system: Secondary | ICD-10-CM

## 2024-06-17 DIAGNOSIS — R197 Diarrhea, unspecified: Secondary | ICD-10-CM | POA: Diagnosis present

## 2024-06-17 DIAGNOSIS — E1165 Type 2 diabetes mellitus with hyperglycemia: Secondary | ICD-10-CM | POA: Diagnosis present

## 2024-06-17 DIAGNOSIS — Z66 Do not resuscitate: Secondary | ICD-10-CM | POA: Diagnosis present

## 2024-06-17 DIAGNOSIS — E1142 Type 2 diabetes mellitus with diabetic polyneuropathy: Secondary | ICD-10-CM | POA: Diagnosis present

## 2024-06-17 DIAGNOSIS — Z7401 Bed confinement status: Secondary | ICD-10-CM

## 2024-06-17 DIAGNOSIS — Z8673 Personal history of transient ischemic attack (TIA), and cerebral infarction without residual deficits: Secondary | ICD-10-CM

## 2024-06-17 DIAGNOSIS — E785 Hyperlipidemia, unspecified: Secondary | ICD-10-CM | POA: Diagnosis present

## 2024-06-17 DIAGNOSIS — K219 Gastro-esophageal reflux disease without esophagitis: Secondary | ICD-10-CM | POA: Diagnosis present

## 2024-06-17 DIAGNOSIS — Z7984 Long term (current) use of oral hypoglycemic drugs: Secondary | ICD-10-CM

## 2024-06-17 LAB — URINALYSIS, COMPLETE (UACMP) WITH MICROSCOPIC
Bilirubin Urine: NEGATIVE
Glucose, UA: >=500 mg/dL — AB
Ketones, ur: NEGATIVE mg/dL
Nitrite: POSITIVE — AB
Protein, ur: 100 mg/dL — AB
RBC / HPF: >50 RBC/hpf (ref 0–5)
Specific Gravity, Urine: 1.015 (ref 1.005–1.030)
WBC, UA: >50 WBC/hpf (ref 0–5)
pH: 5 (ref 5.0–8.0)

## 2024-06-17 LAB — CBC
HCT: 37.2 % — ABNORMAL LOW (ref 39.0–52.0)
Hemoglobin: 13 g/dL (ref 13.0–17.0)
MCH: 32.7 pg (ref 26.0–34.0)
MCHC: 34.9 g/dL (ref 30.0–36.0)
MCV: 93.7 fL (ref 80.0–100.0)
Platelets: 156 K/uL (ref 150–400)
RBC: 3.97 MIL/uL — ABNORMAL LOW (ref 4.22–5.81)
RDW: 11.6 % (ref 11.5–15.5)
WBC: 10.1 K/uL (ref 4.0–10.5)
nRBC: 0 % (ref 0.0–0.2)

## 2024-06-17 LAB — GLUCOSE, CAPILLARY: Glucose-Capillary: 380 mg/dL — ABNORMAL HIGH (ref 70–99)

## 2024-06-17 LAB — C DIFFICILE QUICK SCREEN W PCR REFLEX
C Diff antigen: NEGATIVE
C Diff interpretation: NOT DETECTED
C Diff toxin: NEGATIVE

## 2024-06-17 LAB — COMPREHENSIVE METABOLIC PANEL WITH GFR
ALT: 32 U/L (ref 0–44)
AST: 23 U/L (ref 15–41)
Albumin: 4 g/dL (ref 3.5–5.0)
Alkaline Phosphatase: 79 U/L (ref 38–126)
Anion gap: 12 (ref 5–15)
BUN: 20 mg/dL (ref 8–23)
CO2: 25 mmol/L (ref 22–32)
Calcium: 10.1 mg/dL (ref 8.9–10.3)
Chloride: 100 mmol/L (ref 98–111)
Creatinine, Ser: 1.13 mg/dL (ref 0.61–1.24)
GFR, Estimated: >60 mL/min
Glucose, Bld: 355 mg/dL — ABNORMAL HIGH (ref 70–99)
Potassium: 4.1 mmol/L (ref 3.5–5.1)
Sodium: 137 mmol/L (ref 135–145)
Total Bilirubin: 0.4 mg/dL (ref 0.0–1.2)
Total Protein: 6.9 g/dL (ref 6.5–8.1)

## 2024-06-17 LAB — HEMOGLOBIN A1C
Hgb A1c MFr Bld: 9.4 % — ABNORMAL HIGH (ref 4.8–5.6)
Mean Plasma Glucose: 223.08 mg/dL

## 2024-06-17 LAB — RESP PANEL BY RT-PCR (RSV, FLU A&B, COVID)  RVPGX2
Influenza A by PCR: NEGATIVE
Influenza B by PCR: NEGATIVE
Resp Syncytial Virus by PCR: NEGATIVE
SARS Coronavirus 2 by RT PCR: NEGATIVE

## 2024-06-17 LAB — MAGNESIUM: Magnesium: 1.6 mg/dL — ABNORMAL LOW (ref 1.7–2.4)

## 2024-06-17 MED ORDER — ACETAMINOPHEN 650 MG RE SUPP: 650.0000 mg | Freq: Four times a day (QID) | RECTAL | Status: DC | PRN

## 2024-06-17 MED ORDER — CLONAZEPAM 0.5 MG PO TABS
0.5000 mg | ORAL_TABLET | Freq: Every day | ORAL | Status: DC
  Administered 2024-06-18 – 2024-06-19 (×2): 0.5 mg via ORAL
  Filled 2024-06-17 (×2): qty 1

## 2024-06-17 MED ORDER — ONDANSETRON HCL 4 MG PO TABS: 4.0000 mg | ORAL_TABLET | Freq: Four times a day (QID) | ORAL | Status: DC | PRN

## 2024-06-17 MED ORDER — SODIUM CHLORIDE 0.9% FLUSH
3.0000 mL | Freq: Two times a day (BID) | INTRAVENOUS | Status: DC
  Administered 2024-06-17 – 2024-06-26 (×18): 3 mL via INTRAVENOUS

## 2024-06-17 MED ORDER — ATORVASTATIN CALCIUM 20 MG PO TABS
40.0000 mg | ORAL_TABLET | Freq: Every day | ORAL | Status: DC
  Administered 2024-06-17 – 2024-06-26 (×10): 40 mg via ORAL
  Filled 2024-06-17 (×10): qty 2

## 2024-06-17 MED ORDER — LATANOPROST 0.005 % OP SOLN
1.0000 [drp] | Freq: Every day | OPHTHALMIC | Status: DC
  Administered 2024-06-17 – 2024-06-26 (×10): 1 [drp] via OPHTHALMIC
  Filled 2024-06-17: qty 2.5

## 2024-06-17 MED ORDER — FINASTERIDE 5 MG PO TABS
5.0000 mg | ORAL_TABLET | Freq: Every day | ORAL | Status: DC
  Administered 2024-06-18 – 2024-06-27 (×10): 5 mg via ORAL
  Filled 2024-06-17 (×10): qty 1

## 2024-06-17 MED ORDER — APIXABAN 5 MG PO TABS
5.0000 mg | ORAL_TABLET | Freq: Two times a day (BID) | ORAL | Status: DC
  Administered 2024-06-17 – 2024-06-27 (×20): 5 mg via ORAL
  Filled 2024-06-17 (×20): qty 1

## 2024-06-17 MED ORDER — MAGNESIUM SULFATE 4 GM/100ML IV SOLN
4.0000 g | Freq: Once | INTRAVENOUS | Status: AC
  Administered 2024-06-17: 4 g via INTRAVENOUS
  Filled 2024-06-17: qty 100

## 2024-06-17 MED ORDER — PANTOPRAZOLE SODIUM 40 MG PO TBEC
40.0000 mg | DELAYED_RELEASE_TABLET | Freq: Every day | ORAL | Status: DC
  Administered 2024-06-18 – 2024-06-27 (×10): 40 mg via ORAL
  Filled 2024-06-17 (×10): qty 1

## 2024-06-17 MED ORDER — LABETALOL HCL 5 MG/ML IV SOLN: 10.0000 mg | INTRAVENOUS | Status: DC | PRN

## 2024-06-17 MED ORDER — INSULIN GLARGINE 100 UNIT/ML ~~LOC~~ SOLN
22.0000 [IU] | Freq: Every day | SUBCUTANEOUS | Status: DC
  Administered 2024-06-17: 22 [IU] via SUBCUTANEOUS
  Filled 2024-06-17 (×2): qty 0.22

## 2024-06-17 MED ORDER — ASPIRIN 81 MG PO TBEC
81.0000 mg | DELAYED_RELEASE_TABLET | Freq: Every day | ORAL | Status: DC
  Administered 2024-06-18 – 2024-06-27 (×10): 81 mg via ORAL
  Filled 2024-06-17 (×10): qty 1

## 2024-06-17 MED ORDER — SODIUM CHLORIDE 0.9 % IV SOLN
1.0000 g | Freq: Once | INTRAVENOUS | Status: AC
  Administered 2024-06-17: 1 g via INTRAVENOUS
  Filled 2024-06-17: qty 10

## 2024-06-17 MED ORDER — SODIUM CHLORIDE 0.9 % IV BOLUS
1000.0000 mL | Freq: Once | INTRAVENOUS | Status: AC
  Administered 2024-06-17: 1000 mL via INTRAVENOUS

## 2024-06-17 MED ORDER — ACETAMINOPHEN 325 MG PO TABS
650.0000 mg | ORAL_TABLET | Freq: Four times a day (QID) | ORAL | Status: DC | PRN
  Administered 2024-06-20 – 2024-06-23 (×2): 650 mg via ORAL
  Filled 2024-06-17 (×2): qty 2

## 2024-06-17 MED ORDER — GABAPENTIN 300 MG PO CAPS
300.0000 mg | ORAL_CAPSULE | Freq: Every evening | ORAL | Status: DC
  Administered 2024-06-18: 300 mg via ORAL
  Filled 2024-06-17: qty 1

## 2024-06-17 MED ORDER — ONDANSETRON HCL 4 MG/2ML IJ SOLN
4.0000 mg | Freq: Four times a day (QID) | INTRAMUSCULAR | Status: DC | PRN
  Administered 2024-06-18: 4 mg via INTRAVENOUS
  Filled 2024-06-17: qty 2

## 2024-06-17 MED ORDER — OYSTER SHELL CALCIUM/D3 500-5 MG-MCG PO TABS
1.0000 | ORAL_TABLET | Freq: Every day | ORAL | Status: DC
  Administered 2024-06-18 – 2024-06-27 (×10): 1 via ORAL
  Filled 2024-06-17 (×10): qty 1

## 2024-06-17 MED ORDER — INSULIN ASPART 100 UNIT/ML IJ SOLN
0.0000 [IU] | Freq: Three times a day (TID) | INTRAMUSCULAR | Status: DC
  Administered 2024-06-18: 8 [IU] via SUBCUTANEOUS
  Filled 2024-06-17: qty 8

## 2024-06-17 MED ORDER — VITAMIN B-12 1000 MCG PO TABS
1000.0000 ug | ORAL_TABLET | Freq: Every day | ORAL | Status: DC
  Administered 2024-06-18 – 2024-06-27 (×10): 1000 ug via ORAL
  Filled 2024-06-17 (×10): qty 1

## 2024-06-17 MED ORDER — HEPARIN SODIUM (PORCINE) 5000 UNIT/ML IJ SOLN: 5000.0000 [IU] | Freq: Three times a day (TID) | INTRAMUSCULAR | Status: DC

## 2024-06-17 MED ORDER — SERTRALINE HCL 50 MG PO TABS
150.0000 mg | ORAL_TABLET | Freq: Every day | ORAL | Status: DC
  Administered 2024-06-18 – 2024-06-27 (×10): 150 mg via ORAL
  Filled 2024-06-17 (×10): qty 3

## 2024-06-17 MED ORDER — SENNOSIDES-DOCUSATE SODIUM 8.6-50 MG PO TABS
1.0000 | ORAL_TABLET | Freq: Every evening | ORAL | Status: DC | PRN
  Administered 2024-06-17: 1 via ORAL
  Filled 2024-06-17: qty 1

## 2024-06-17 MED ORDER — INSULIN ASPART 100 UNIT/ML IJ SOLN
0.0000 [IU] | Freq: Every day | INTRAMUSCULAR | Status: DC
  Administered 2024-06-17: 5 [IU] via SUBCUTANEOUS
  Filled 2024-06-17: qty 5

## 2024-06-17 NOTE — ED Provider Notes (Incomplete)
" ° °  [facility] Provider Note    Event Date/Time   First MD Initiated Contact with Patient 06/17/24 1309     (approximate)  History   Chief Complaint: Weakness  HPI  Troy Morse is a 64 y.o. male with a past medical history of CKD, diabetes, gastric reflux, hypertension, prior CVA, bedbound, presents to the emergency department with concerns for weakness.  According to EMS patient is coming from home where he has home health care they state over the last couple days patient has had worsening weakness and fatigue.  Patient just completed a course of antibiotics for possible urinary tract infection.  There is significant amount of debris within the patient's Foley catheter tubing.  Patient also has a history of DKA previously.  Patient denies any nausea or vomiting.  Denies any pain.  No known fever.  Physical Exam   Triage Vital Signs: ED Triage Vitals [06/17/24 1317]  Encounter Vitals Group     BP      Girls Systolic BP Percentile      Girls Diastolic BP Percentile      Boys Systolic BP Percentile      Boys Diastolic BP Percentile      Pulse      Resp      Temp      Temp src      SpO2      Weight 220 lb 7.4 oz (100 kg)     Height 5' 10 (1.778 m)     Head Circumference      Peak Flow      Pain Score      Pain Loc      Pain Education      Exclude from Growth Chart     Most recent vital signs: There were no vitals filed for this visit.  General: Awake, no distress.  CV:  Good peripheral perfusion.  Regular rate and rhythm  Resp:  Normal effort.  Equal breath sounds bilaterally.  Abd:  No distention.  Soft, nontender.  No rebound or guarding.  ED Results / Procedures / Treatments   EKG  ***  RADIOLOGY  ***   MEDICATIONS ORDERED IN ED: Medications - No data to display   IMPRESSION / MDM / ASSESSMENT AND PLAN / ED COURSE  I reviewed the triage vital signs and the nursing notes.  Patient's presentation is most consistent with  acute presentation with potential threat to life or bodily function.  Patient presents the emergency department for generalized weakness and fatigue worsening over the last few days.  Patient is bedbound at baseline.  Patient is able to answer questions responds slowly but appears to be accurate.  We will check labs we will obtain a urine sample after replacing the Foley catheter.  Will obtain a COVID/flu swab will IV hydrate continue to closely monitor while awaiting further results.  FINAL CLINICAL IMPRESSION(S) / ED DIAGNOSES   Weakness   Note:  This document was prepared using Dragon voice recognition software and may include unintentional dictation errors. "

## 2024-06-17 NOTE — ED Provider Notes (Signed)
 Consulted with and patient admitted to hospital service under care of Dr. Fernand KANDICE Dicky Oneil, MD 06/17/24 [phone]

## 2024-06-17 NOTE — ED Triage Notes (Signed)
 Pt to ER via EMS with c/o increasing weakness and lethargy for last day.  Pt has chronic foley and UTIs, just completed Abx for same.  Pt has hx of DKA, especially after abx treatment. Foley last changed 6 weeks ago during hospitalization.  Urine appears cloudy with sediment.

## 2024-06-17 NOTE — H&P (Addendum)
 " History and Physical    Troy Morse FMW:[id number] DOB: 1961/02/28 DOA: 06/17/2024  DOS: the patient was seen and examined on 06/17/2024  PCP: [name], MD   Patient coming from: Home  I have personally briefly reviewed patient[facility]  HPI:   Troy Morse is a 64 y.o. year old male with medical history of hypertension, hyperlipidemia, type 2 diabetes, history of CVA, history of PE on Eliquis  who presented to the ED with increasing weakness and somnolence.  Patient with poor functional status who has chronic foley catheter and later treatment for UTI.  His last catheter was placed 6 weeks ago. Per wife, he was supposed to have urology appointment to remove catheter but cancelled due to weakness. He denies any fevers or chills. On arrival to the ED patient was noted to be HDS stable.  Lab work and imaging obtained.  CBC without leukocytosis but WBC at upper limit of normal, CMP with moderate hyperglycemia but otherwise unremarkable.  Respiratory panel negative for COVID, flu, RSV.  UA consistent with urinary tract infection.  Urine culture pending.  Patient given 1 dose of ceftriaxone , appears last culture showed E. coli.  Given this, TRH contacted for admission.  Review of Systems: As mentioned in the history of present illness. All other systems reviewed and are negative.   Past Medical History:  Diagnosis Date   CRD (chronic renal disease)    Diabetes mellitus without complication (HCC)    GERD (gastroesophageal reflux disease)    Hypertension     Past Surgical History:  Procedure Laterality Date   BACK SURGERY     BONE EXCISION Right 03/21/2019   Procedure: PART EXCISION BONE-PHALANX RIGHT GREAT TOE;  Surgeon: Ashley Soulier, DPM;  Location: [facility] ORS;  Service: Podiatry;  Laterality: Right;   CHOLECYSTECTOMY     EYE SURGERY     torn retina   HERNIA REPAIR     INCISION AND DRAINAGE Right 12/13/2018   Procedure:  INCISION AND DRAINAGE GREAT RIGHT TOE;  Surgeon: Ashley Soulier, DPM;  Location: [facility] ORS;  Service: Podiatry;  Laterality: Right;     Allergies[1]  Family History  Problem Relation Age of Onset   COPD Mother    Heart disease Father    COPD Father     Prior to Admission medications  Medication Sig Start Date End Date Taking? Authorizing Provider  acetaminophen  (TYLENOL ) 500 MG tablet Take 1,000 mg by mouth every 8 (eight) hours as needed for mild pain (pain score 1-3). 04/13/21  Yes [provider]  aspirin  EC 81 MG tablet Take 81 mg by mouth daily.   Yes [provider]  atorvastatin  (LIPITOR) 40 MG tablet Take 1 tablet (40 mg total) by mouth daily. 04/29/23  Yes Darci Pore, MD  calcium -vitamin D (OSCAL WITH D) 500-5 MG-MCG tablet Take 1 tablet by mouth daily with breakfast.   Yes [provider]  clonazePAM  (KLONOPIN ) 0.5 MG tablet Take 0.5 mg by mouth daily.   Yes [provider]  cyanocobalamin  (VITAMIN B12) 500 MCG tablet Take 1,000 mcg by mouth daily.   Yes [provider]  ELIQUIS  5 MG TABS tablet Take 5 mg by mouth 2 (two) times daily.   Yes [provider]  finasteride  (PROSCAR ) 5 MG tablet Take 5 mg by mouth daily.   Yes [provider]  gabapentin  (NEURONTIN ) 300 MG capsule Take 300 mg by mouth every evening. 04/26/18  Yes [provider]  insulin  glargine (LANTUS ) 100 UNIT/ML injection Inject 22 Units into the skin daily. 22-24   Yes [provider]  latanoprost  (XALATAN ) 0.005 % ophthalmic solution Place 1 drop into both eyes at bedtime. 04/26/18  Yes [provider]  metFORMIN  (GLUCOPHAGE ) 500 MG tablet Take 1,000 mg by mouth at bedtime.    Yes [provider]  midodrine (PROAMATINE) 2.5 MG tablet Take 2.5 mg by mouth 3 (three) times daily with meals.   Yes [provider]  pantoprazole  (PROTONIX ) 40 MG tablet Take 40 mg by mouth daily. 06/16/24  Yes [provider]  sertraline  (ZOLOFT ) 100 MG tablet Take 150 mg by mouth daily. 11/06/17  Yes [provider]  brimonidine  (ALPHAGAN ) 0.2 % ophthalmic solution Place 1 drop into both eyes 2 (two) times a day.    [provider]  empagliflozin  (JARDIANCE ) 10 MG TABS tablet Take 25 mg by mouth daily. 11/29/20   [provider]  icosapent  Ethyl (VASCEPA ) 1 g capsule Take 2 g by mouth daily. 12/11/20   [provider]  metoprolol  tartrate (LOPRESSOR ) 25 MG tablet Take 1 tablet (25 mg total) by mouth 2 (two) times daily. 04/29/23 04/28/24  Darci Pore, MD  NONFORMULARY OR COMPOUNDED ITEM Trimix (30/1/10)-(Pap/Phent/PGE)  Dosage: Inject 1ml per injection  Prefilled Syringe # Test Dose 3ml vial Vial 5ml   Qty 5ml Refills 3  [facility] [phone] Fax [phone] 10/01/18   Twylla Glendia BROCKS, MD  TRESIBA FLEXTOUCH 200 UNIT/ML SOPN Inject 88-98 Units into the skin every evening.     [provider]    Social History:  reports that he has never smoked. He has never used smokeless tobacco. He reports that he does not currently use alcohol. He reports that he does not currently use drugs. Lives with wife, does not smoke or drink. Needs assistance with ADLs and iADLs.   Physical Exam: Vitals:   06/17/24 1545 06/17/24 1600 06/17/24 1615 06/17/24 1736  BP: (!) 153/90 138/61 (!) 148/85 (!) 133/94  Pulse: 67 64 65 63  Resp: (!) 22 11 12 16   Temp:    97.8 F (36.6 C)  TempSrc:    Oral  SpO2: 100% 100% 100% 97%  Weight:      Height:        Gen: chronically ill appearing HENT: NCAT CV: normal heart sounds, diminished radial pulses Lung: CTAB Abd: No TTP, normal bowel sounds MSK: right foot with amputation Neuro: alert and oriented, 2/5 strength in bilateral lower extremity.   Labs on Admission: I have personally reviewed following labs and imaging studies  CBC: Recent Labs  Lab 06/17/24 1405  WBC 10.1  HGB 13.0  HCT 37.2*   MCV 93.7  PLT 156   Basic Metabolic Panel: Recent Labs  Lab 06/17/24 1405 06/17/24 1455  NA 137  --   K 4.1  --   CL 100  --   CO2 25  --   GLUCOSE 355*  --   BUN 20  --   CREATININE 1.13  --   CALCIUM  10.1  --   MG  --  1.6*   GFR: Estimated Creatinine Clearance: 78.3 mL/min (by C-G formula based on SCr of 1.13 mg/dL). Liver Function Tests: Recent Labs  Lab 06/17/24 1405  AST 23  ALT 32  ALKPHOS 79  BILITOT 0.4  PROT 6.9  ALBUMIN 4.0   No results for input(s): LIPASE, AMYLASE in the last 168 hours. No results for input(s):  AMMONIA in the last 168 hours. Coagulation Profile: No results for input(s): INR, PROTIME in the last 168 hours. Cardiac Enzymes: No results for input(s): CKTOTAL, CKMB, CKMBINDEX, TROPONINI, TROPONINIHS in the last 168 hours. BNP (last 3 results) No results for input(s): BNP in the last 8760 hours. HbA1C: No results for input(s): HGBA1C in the last 72 hours. CBG: No results for input(s): GLUCAP in the last 168 hours. Lipid Profile: No results for input(s): CHOL, HDL, LDLCALC, TRIG, CHOLHDL, LDLDIRECT in the last 72 hours. Thyroid Function Tests: No results for input(s): TSH, T4TOTAL, FREET4, T3FREE, THYROIDAB in the last 72 hours. Anemia Panel: No results for input(s): VITAMINB12, FOLATE, FERRITIN, TIBC, IRON, RETICCTPCT in the last 72 hours. Urine analysis:    Component Value Date/Time   COLORURINE YELLOW (A) 06/17/2024 1439   APPEARANCEUR CLOUDY (A) 06/17/2024 1439   LABSPEC 1.015 06/17/2024 1439   PHURINE 5.0 06/17/2024 1439   GLUCOSEU >=500 (A) 06/17/2024 1439   HGBUR LARGE (A) 06/17/2024 1439   BILIRUBINUR NEGATIVE 06/17/2024 1439   KETONESUR NEGATIVE 06/17/2024 1439   PROTEINUR 100 (A) 06/17/2024 1439   NITRITE POSITIVE (A) 06/17/2024 1439   LEUKOCYTESUR MODERATE (A) 06/17/2024 1439    Radiological Exams on Admission: I have personally reviewed images DG Chest  Portable 1 View Result Date: 06/17/2024 CLINICAL DATA:  Weakness. EXAM: PORTABLE CHEST 1 VIEW COMPARISON:  01/06/2018. FINDINGS: The heart size and mediastinal contours are within normal limits. Low lung volumes with bronchovascular crowding. No focal consolidation, sizeable pleural effusion, or pneumothorax. No acute osseous abnormality. IMPRESSION: Low lung volumes.  No acute cardiopulmonary findings. Electronically Signed   By: Harrietta Sherry M.D.   On: 06/17/2024 13:56    EKG: My personal interpretation of EKG shows: pending  Assessment/Plan Principal Problem:   Complicated UTI (urinary tract infection) Active Problems:   Type 2 diabetes mellitus with peripheral neuropathy (HCC)   Essential hypertension   CAD (coronary artery disease)   CKD (chronic kidney disease) stage 3, GFR 30-59 ml/min (HCC)   H/O: stroke   Urinary Tract Infection Patient found to have urinary tract infection. Per home caregivers he has been having more lethargy and increased weakness.  UA consistent with UTI. Urine and blood culture pending. Pt started on IV Ceftriaxone  1 g daily. Given pt's urinary retention, he has foley catheter placed. Plan was to follow up with urology on 06/16/24 for voiding trial.  -Continue current abx therapy -Follow up cultures -Monitor fever curve and leukocytosis -Will need outpatient urology follow up to see if catheter can be removed. He is established with [facility].  -Consider voiding trial while inpatient  Hypertension: Mildly hypertensive.  Not on any pharmacotherapy as he has history of hypotension.  Will continue to monitor given active infection.  Holding home midodrine 2.5 mg 3 times daily.  Hyperlipidemia/CAD: Continue home statin.  CKD 3A: At baseline.  Renally dose medications, avoid nephrotoxic agents.  GAD: Continue home clonazepam , SSRI  BPH: Continue home finasteride   Type 2 diabetes: Continue home glargine, start SSI.  Holding home metformin .  Continue  home gabapentin  for neuropathy. Uncontrolled diabetes may be leading to recurrent UTI. Needs good control of his diabetes, discussed extensively with wife and pt.   GERD: Continue home PPI  Hx of PE: on elquis, continue home eliquis .   Debility: 2/2 to strokes since July. Bed bound since stroke. Will need continued PT/OT as he gets home PT/OT.  PT/OT consulted.   Diarrhea: Appears to be chronic. C. Diff negative. GI panel  ordered.   VTE prophylaxis:  eliquis  Diet: HH/Carb Code Status:  DNR/DNI Telemetry:  Admission status: Observation, Med-Surg Patient is from: Home Anticipated d/c is to: Home Anticipated d/c is in: 1-2 days   Family Communication: Updated at bedside  Consults called: None   Severity of Illness: The appropriate patient status for this patient is OBSERVATION. Observation status is judged to be reasonable and necessary in order to provide the required intensity of service to ensure the patient's safety. The patient's presenting symptoms, physical exam findings, and initial radiographic and laboratory data in the context of their medical condition is felt to place them at decreased risk for further clinical deterioration. Furthermore, it is anticipated that the patient will be medically stable for discharge from the hospital within 2 midnights of admission.    Morene Bathe, MD [facility] DEL. [facility]      [1] No Active Allergies  "

## 2024-06-18 ENCOUNTER — Observation Stay

## 2024-06-18 DIAGNOSIS — N390 Urinary tract infection, site not specified: Secondary | ICD-10-CM | POA: Diagnosis not present

## 2024-06-18 LAB — GASTROINTESTINAL PANEL BY PCR, STOOL (REPLACES STOOL CULTURE)

## 2024-06-18 LAB — BASIC METABOLIC PANEL WITH GFR
Anion gap: 12 (ref 5–15)
Anion gap: 12 (ref 5–15)
BUN: 18 mg/dL (ref 8–23)
BUN: 17 mg/dL (ref 8–23)
CO2: 24 mmol/L (ref 22–32)
CO2: 25 mmol/L (ref 22–32)
Calcium: 9.8 mg/dL (ref 8.9–10.3)
Calcium: 9.8 mg/dL (ref 8.9–10.3)
Chloride: 99 mmol/L (ref 98–111)
Chloride: 99 mmol/L (ref 98–111)
Creatinine, Ser: 1.12 mg/dL (ref 0.61–1.24)
Creatinine, Ser: 1.17 mg/dL (ref 0.61–1.24)
GFR, Estimated: >60 mL/min
GFR, Estimated: >60 mL/min
Glucose, Bld: 286 mg/dL — ABNORMAL HIGH (ref 70–99)
Glucose, Bld: 405 mg/dL — ABNORMAL HIGH (ref 70–99)
Potassium: 4.1 mmol/L (ref 3.5–5.1)
Potassium: 4.5 mmol/L (ref 3.5–5.1)
Sodium: 135 mmol/L (ref 135–145)
Sodium: 135 mmol/L (ref 135–145)

## 2024-06-18 LAB — CBC
HCT: 37.4 % — ABNORMAL LOW (ref 39.0–52.0)
HCT: 38.4 % — ABNORMAL LOW (ref 39.0–52.0)
Hemoglobin: 12.9 g/dL — ABNORMAL LOW (ref 13.0–17.0)
Hemoglobin: 13 g/dL (ref 13.0–17.0)
MCH: 32.2 pg (ref 26.0–34.0)
MCH: 32 pg (ref 26.0–34.0)
MCHC: 34.5 g/dL (ref 30.0–36.0)
MCHC: 33.9 g/dL (ref 30.0–36.0)
MCV: 93.3 fL (ref 80.0–100.0)
MCV: 94.6 fL (ref 80.0–100.0)
Platelets: 153 K/uL (ref 150–400)
Platelets: 145 K/uL — ABNORMAL LOW (ref 150–400)
RBC: 4.01 MIL/uL — ABNORMAL LOW (ref 4.22–5.81)
RBC: 4.06 MIL/uL — ABNORMAL LOW (ref 4.22–5.81)
RDW: 11.3 % — ABNORMAL LOW (ref 11.5–15.5)
RDW: 11.6 % (ref 11.5–15.5)
WBC: 9.2 K/uL (ref 4.0–10.5)
WBC: 8.6 K/uL (ref 4.0–10.5)
nRBC: 0 % (ref 0.0–0.2)
nRBC: 0 % (ref 0.0–0.2)

## 2024-06-18 LAB — GLUCOSE, CAPILLARY
Glucose-Capillary: 186 mg/dL — ABNORMAL HIGH (ref 70–99)
Glucose-Capillary: 287 mg/dL — ABNORMAL HIGH (ref 70–99)
Glucose-Capillary: 289 mg/dL — ABNORMAL HIGH (ref 70–99)
Glucose-Capillary: 290 mg/dL — ABNORMAL HIGH (ref 70–99)
Glucose-Capillary: 299 mg/dL — ABNORMAL HIGH (ref 70–99)
Glucose-Capillary: 300 mg/dL — ABNORMAL HIGH (ref 70–99)
Glucose-Capillary: 376 mg/dL — ABNORMAL HIGH (ref 70–99)

## 2024-06-18 LAB — HIV ANTIBODY (ROUTINE TESTING W REFLEX): HIV Screen 4th Generation wRfx: NONREACTIVE

## 2024-06-18 MED ORDER — INSULIN ASPART 100 UNIT/ML IJ SOLN
10.0000 [IU] | Freq: Once | INTRAMUSCULAR | Status: AC
  Administered 2024-06-18: 10 [IU] via SUBCUTANEOUS
  Filled 2024-06-18: qty 10

## 2024-06-18 MED ORDER — SODIUM CHLORIDE 0.9 % IV SOLN: INTRAVENOUS | Status: DC

## 2024-06-18 MED ORDER — SODIUM CHLORIDE 0.9 % IV SOLN
12.5000 mg | Freq: Four times a day (QID) | INTRAVENOUS | Status: DC | PRN
  Administered 2024-06-18: 12.5 mg via INTRAVENOUS
  Filled 2024-06-18: qty 12.5

## 2024-06-18 MED ORDER — CHLORHEXIDINE GLUCONATE CLOTH 2 % EX PADS
6.0000 | MEDICATED_PAD | Freq: Every day | CUTANEOUS | Status: AC
  Administered 2024-06-18 – 2024-06-24 (×7): 6 via TOPICAL

## 2024-06-18 MED ORDER — INSULIN GLARGINE 100 UNIT/ML ~~LOC~~ SOLN
30.0000 [IU] | Freq: Every day | SUBCUTANEOUS | Status: DC
  Administered 2024-06-18 – 2024-06-23 (×6): 30 [IU] via SUBCUTANEOUS
  Filled 2024-06-18 (×7): qty 0.3

## 2024-06-18 MED ORDER — INSULIN ASPART 100 UNIT/ML IJ SOLN
0.0000 [IU] | INTRAMUSCULAR | Status: DC
  Administered 2024-06-18: 15 [IU] via SUBCUTANEOUS
  Filled 2024-06-18: qty 15

## 2024-06-18 MED ORDER — INSULIN ASPART 100 UNIT/ML IJ SOLN
0.0000 [IU] | INTRAMUSCULAR | Status: DC
  Administered 2024-06-18: 4 [IU] via SUBCUTANEOUS
  Administered 2024-06-18: 11 [IU] via SUBCUTANEOUS
  Filled 2024-06-18: qty 4
  Filled 2024-06-18: qty 11

## 2024-06-18 MED ORDER — DEXTROSE 50 % IV SOLN: 1.0000 | INTRAVENOUS | Status: DC | PRN

## 2024-06-18 MED ORDER — SODIUM CHLORIDE 0.9 % IV SOLN
1.0000 g | INTRAVENOUS | Status: DC
  Administered 2024-06-18: 1 g via INTRAVENOUS
  Filled 2024-06-18 (×2): qty 10

## 2024-06-18 MED ORDER — INSULIN ASPART 100 UNIT/ML IJ SOLN
5.0000 [IU] | INTRAMUSCULAR | Status: DC
  Administered 2024-06-18 – 2024-06-19 (×3): 5 [IU] via SUBCUTANEOUS
  Filled 2024-06-18 (×3): qty 5

## 2024-06-18 NOTE — Progress Notes (Signed)
 PT Cancellation Note  Patient Details Name: Troy Morse MRN: [id number] DOB: 10-21-1960   Cancelled Treatment:    Reason Eval/Treat Not Completed: Other (comment) Attempted to see pt for PT evaluation with pt received in bed, vomiting & diaphoretic, nurse called to room & attending to pt. Will re-attempt eval as able.  Richerd Pinal, PT, DPT 06/18/2024, 10:01 AM   Richerd CHRISTELLA Pinal 06/18/2024, 10:01 AM

## 2024-06-18 NOTE — TOC CM/SW Note (Signed)
 Patient requires a Nurse, Adult for transition out of the bed, without the lift the patient would be confined to the bed.

## 2024-06-18 NOTE — Care Management Obs Status (Signed)
 MEDICARE OBSERVATION STATUS NOTIFICATION   Patient Details  Name: Troy Morse MRN: [id number] Date of Birth: 1960-09-29   Medicare Observation Status Notification Given:  Yes    Azilee Pirro W, CMA 06/18/2024, 11:14 AM

## 2024-06-18 NOTE — TOC Initial Note (Addendum)
 Transition of Care [facility]) - Initial/Assessment Note    Patient Details  Name: Troy Morse MRN: [id number] Date of Birth: [birth year]  Transition [facility]) CM/SW Contact:    Daved JONETTA Hamilton, RN Phone Number: 06/18/2024, 3:42 PM  Clinical Narrative:                  Spoke with Troy Morse patient's spouse and POA. Elizabeth provided [facility] with POA documentation, this CM will place in patient's chart to be scanned. Troy verbalized that patient resides at home with her and that they have [facility] services through [place]. Therapy recommends a hospital bed and hoyer lift for at home and Troy verbalized permission to reach out to [employer] to see if this can be arranged. This CM contacted [employer], requested Mitch to order. Attending placed order for hospital bed to include the hoyer lift, medical necessity notes routed to Attending for signature.   Once patient is medically ready for discharge will need transportation arranged as patient is LV1 Bedbound. Patient will need resumption orders for [facility] for aide, Nurse, PT, and OT.   TOC will continue to follow.   Expected Discharge Plan: Home w Home Health Services Barriers to Discharge: Continued Medical Work up   Patient Goals and CMS Choice            Expected Discharge Plan and Services   Discharge Planning Services: CM Consult Post Acute Care Choice: Durable Medical Equipment, Home Health Living arrangements for the past 2 months: Single Family Home                 DME Arranged: Hospital bed, Other see comment [employer]) DME Agency: [employer]                  Prior Living Arrangements/Services Living arrangements for the past 2 months: Single Family Home Lives with:: Spouse          Need for Family Participation in Patient Care: Yes (Comment) Care giver support system in place?: Yes (comment) Current home services: Homehealth aide, Home PT, Home RN Criminal Activity/Legal Involvement Pertinent to Current  Situation/Hospitalization: No - Comment as needed  Activities of Daily Living   ADL Screening (condition at time of admission) Independently performs ADLs?: No Does the patient have a NEW difficulty with bathing/dressing/toileting/self-feeding that is expected to last >3 days?: No Does the patient have a NEW difficulty with getting in/out of bed, walking, or climbing stairs that is expected to last >3 days?: No Does the patient have a NEW difficulty with communication that is expected to last >3 days?: No Is the patient deaf or have difficulty hearing?: No Does the patient have difficulty seeing, even when wearing glasses/contacts?: No Does the patient have difficulty concentrating, remembering, or making decisions?: No  Permission Sought/Granted      Share Information with NAME: Tino Ronan [facility])     Permission granted to share info w Relationship: Spouse POA  Permission granted to share info w Contact Information: [phone]  Emotional Assessment       Orientation: : Oriented to Self Alcohol / Substance Use: Not Applicable Psych Involvement: No (comment)  Admission diagnosis:  Complicated UTI (urinary tract infection) [N39.0] Patient Active Problem List   Diagnosis Date Noted   Complicated UTI (urinary tract infection) 06/17/2024   H/O: stroke 06/17/2024   Obesity (BMI 30-39.9) 04/28/2023   Type 2 diabetes mellitus with peripheral neuropathy (HCC) 04/27/2023   Dyslipidemia 04/27/2023   Essential hypertension 04/27/2023  Acute kidney injury superimposed on chronic kidney disease 04/27/2023   Acute CVA (cerebrovascular accident) (HCC) 04/26/2023   Acute osteomyelitis of toe of right foot (HCC) 12/11/2018   Age-related nuclear cataract of both eyes 07/18/2017   Atrophic gastritis without hemorrhage 11/23/2015   Acute pancreatitis 10/03/2015   CKD (chronic kidney disease) stage 3, GFR 30-59 ml/min (HCC) 05/30/2012   Abnormal stress echocardiogram 05/07/2012   CAD  (coronary artery disease) 04/26/2012   Cataract 08/23/2011   Adjustment reaction 03/03/2011   PCP:  [name], MD Pharmacy:   [facility] [phone] [place], [place] - [address] AT [facility] OF [address] [place] [address] Phone: [phone] Fax: [phone]  [facility] - [place], [place] - [address] [place] [address] Phone: [phone] Fax: [phone]  [facility] #[id number] [place], [place] - [address] [facility] [address] [facility] [address] [address] AT [facility] OF [address] [facility] [address] [place] [address] Phone: [phone] Fax: [phone]  [facility] - [place], [place] - [address] [place] [address] Phone: [phone] Fax: [phone]     Social Drivers of Health (SDOH) Social History: SDOH Screenings   Food Insecurity: No Food Insecurity (06/17/2024)  Housing: Low Risk (06/17/2024)  Transportation Needs: No Transportation Needs (06/17/2024)  Utilities: Not At Risk (06/17/2024)  Financial Resource Strain: Low Risk  (04/29/2024)   Received from [facility]  Tobacco Use: Low Risk (06/17/2024)  Health Literacy: Adequate Health Literacy (05/15/2024)   Received from [facility]   SDOH Interventions:     Readmission Risk Interventions     No data to display

## 2024-06-18 NOTE — Care Plan (Signed)
 ICU CN at bedside to exchange foley catheter

## 2024-06-18 NOTE — Evaluation (Signed)
 Physical Therapy Evaluation Patient Details Name: Troy Morse MRN: [id number] DOB: Apr 19, 1961 Today's Date: 06/18/2024  History of Present Illness  Pt is a 64 y/o M admitted on 06/17/24 after presenting with c/o increasing weakness & somnolence. Pt is being treated for complicated UTI. PMH: HTN, HLD, DM2, CVA, PE, chronic renal disease  Clinical Impression  Pt seen for PT evaluation with pt agreeable, wife present during session. Per wife, pt requires use of sit>stand lift to transfer OOB but hasn't done this for awhile. On this date, pt demonstrates active movement in LUE only. Pt requires total assist +2 for supine<>sit, sit<>stand from elevated EOB. Recommend ongoing PT services to reduce caregiver burden.        If plan is discharge home, recommend the following: Two people to help with walking and/or transfers;Two people to help with bathing/dressing/bathroom;Assistance with feeding;Supervision due to cognitive status;Direct supervision/assist for financial management;Direct supervision/assist for medications management   Can travel by private vehicle        Equipment Recommendations Hospital bed;Hoyer lift  Recommendations for Other Services       Functional Status Assessment Patient has had a recent decline in their functional status and demonstrates the ability to make significant improvements in function in a reasonable and predictable amount of time.     Precautions / Restrictions Precautions Precautions: Fall Restrictions Weight Bearing Restrictions Per Provider Order: No      Mobility  Bed Mobility Overal bed mobility: Needs Assistance Bed Mobility: Supine to Sit, Sit to Supine     Supine to sit: Total assist, +2 for physical assistance, +2 for safety/equipment, Used rails, HOB elevated (exit R side of bed) Sit to supine: Total assist, +2 for physical assistance, +2 for safety/equipment, HOB elevated, Used rails   General bed mobility comments: +2 to scoot to  [facility]    Transfers Overall transfer level: Needs assistance Equipment used: 2 person hand held assist Transfers: Sit to/from Stand Sit to Stand: Total assist, +2 physical assistance, +2 safety/equipment, From elevated surface           General transfer comment: sit>stand from elevated EOB with BUE HHA, +2 assist, therapists blocking B knees for increased stability    Ambulation/Gait                  Stairs            Wheelchair Mobility     Tilt Bed    Modified Rankin (Stroke Patients Only)       Balance Overall balance assessment: Needs assistance Sitting-balance support: Feet supported Sitting balance-Leahy Scale: Poor Sitting balance - Comments: Attempted to engage pt in reaching outside of BOS with LUE with pt attempting ~2 times Postural control: Right lateral lean, Posterior lean Standing balance support: During functional activity, Bilateral upper extremity supported Standing balance-Leahy Scale: Zero                               Pertinent Vitals/Pain Pain Assessment Pain Assessment: Faces Faces Pain Scale: Hurts a little bit Pain Location: generalized with mobility Pain Descriptors / Indicators: Grimacing Pain Intervention(s): Limited activity within patient's tolerance, Monitored during session, Repositioned    Home Living Family/patient expects to be discharged to:: Private residence Living Arrangements: Spouse/significant other Available Help at Discharge: Family;Available 24 hours/day             Home Equipment: Wheelchair - manual;Other (comment) Additional Comments: sit to stand lift, adjustable bed  Prior Function Prior Level of Function : Needs assist             Mobility Comments: Pt requiring total assist to use sit<>stand lift to transfer to w/c but hasn't done this for weeks per wife. ADLs Comments: total a except does assist with brushing his teeth.     Extremity/Trunk Assessment   Upper  Extremity Assessment Upper Extremity Assessment: LUE deficits/detail;RUE deficits/detail RUE Deficits / Details: 1/5 grossly LUE Deficits / Details: 3-/5 grossly    Lower Extremity Assessment Lower Extremity Assessment: Defer to PT evaluation LLE Deficits / Details: -20 degrees from full knee extension PROM       Communication   Communication Communication: Impaired Factors Affecting Communication: Difficulty expressing self;Reduced clarity of speech    Cognition Arousal: Alert Behavior During Therapy: WFL for tasks assessed/performed   PT - Cognitive impairments: History of cognitive impairments                         Following commands: Impaired Following commands impaired: Follows one step commands with increased time, Follows one step commands inconsistently     Cueing Cueing Techniques: Verbal cues, Gestural cues     General Comments General comments (skin integrity, edema, etc.): assisted pt with donning new gown 2/2 current one soiled 2/2 sweat    Exercises     Assessment/Plan    PT Assessment Patient needs continued PT services  PT Problem List Decreased strength;Decreased coordination;Decreased range of motion;Decreased activity tolerance;Decreased cognition;Decreased knowledge of use of DME;Decreased balance;Decreased safety awareness;Decreased mobility;Decreased knowledge of precautions       PT Treatment Interventions DME instruction;Therapeutic exercise;Wheelchair mobility training;Balance training;Neuromuscular re-education;Functional mobility training;Cognitive remediation;Therapeutic activities;Patient/family education    PT Goals (Current goals can be found in the Care Plan section)  Acute Rehab PT Goals Patient Stated Goal: get better PT Goal Formulation: With patient/family Time For Goal Achievement: 07/02/24 Potential to Achieve Goals: Fair    Frequency Min 1X/week     Co-evaluation PT/OT/SLP Co-Evaluation/Treatment: Yes Reason  for Co-Treatment: To address functional/ADL transfers;Complexity of the patient's impairments (multi-system involvement);For patient/therapist safety PT goals addressed during session: Mobility/safety with mobility;Balance OT goals addressed during session: ADL's and self-care       AM-PAC PT 6 Clicks Mobility  Outcome Measure Help needed turning from your back to your side while in a flat bed without using bedrails?: Total Help needed moving from lying on your back to sitting on the side of a flat bed without using bedrails?: Total Help needed moving to and from a bed to a chair (including a wheelchair)?: Total Help needed standing up from a chair using your arms (e.g., wheelchair or bedside chair)?: Total Help needed to walk in hospital room?: Total Help needed climbing 3-5 steps with a railing? : Total 6 Click Score: 6    End of Session   Activity Tolerance: Patient tolerated treatment well Patient left: in bed;with call bell/phone within reach;with bed alarm set Nurse Communication: Mobility status PT Visit Diagnosis: Other abnormalities of gait and mobility (R26.89);Muscle weakness (generalized) (M62.81)    Time: 8567-8542 PT Time Calculation (min) (ACUTE ONLY): 25 min   Charges:   PT Evaluation $PT Eval Moderate Complexity: 1 Mod   PT General Charges $$ ACUTE PT VISIT: 1 Visit         Richerd Pinal, PT, DPT 06/18/2024, 3:14 PM   Richerd CHRISTELLA Pinal 06/18/2024, 3:13 PM

## 2024-06-18 NOTE — Plan of Care (Signed)
  Problem: Nutrition: Goal: Adequate nutrition will be maintained Outcome: Progressing   Problem: Coping: Goal: Level of anxiety will decrease Outcome: Progressing   Problem: Pain Managment: Goal: General experience of comfort will improve and/or be controlled Outcome: Progressing   Problem: Skin Integrity: Goal: Risk for impaired skin integrity will decrease Outcome: Progressing

## 2024-06-18 NOTE — Hospital Course (Addendum)
 Hospital course / significant events:   HPI: Troy Morse is a 64 y.o. year old male with medical history of hypertension, hyperlipidemia, type 2 diabetes, history of CVA, history of PE on Eliquis  who presented to the ED with increasing weakness and somnolence.  Patient with poor functional status who has chronic foley catheter and later treatment for UTI.  His last catheter was placed 6 weeks ago. Per wife, he was supposed to have urology appointment to remove catheter but cancelled due to weakness. He denies any fevers or chills.   01/06: On arrival to the ED patient was noted to be HDS. CBC without leukocytosis, CMP with moderate hyperglycemia but.  Respiratory panel negative for COVID, flu, RSV.  UA consistent with urinary tract infection.  Urine culture pending.  Patient given 1 dose of ceftriaxone , appears last culture showed E. coli.  Admitted to hospitalist.  01/07: ongoing confusion/encephalopathic. UCx again (+)Ecoli, pending susceptibilities. Aspiration event this morning, question vomiting. NPO pending SLP eval, continue NS fluids. Foley exchanged today  01/08: much more alert / baseline. UCx (+)EColi multiple resistances, d/w ID pharmacist - plan for gentamicin dose and DC home however pt's wife requests he stays here for void trial, she would like to establish him w/ [facility] urologist instead of [facility]. D/w urology - will see pt, for now can continue w/ ceftriaxone  01/09: d/w urology, recommendation is for outpatient void trial but there is concern re transportation and mobility issues - d/w urology, will plan for void trial here Monday, will have to be Monday given limited availability of urology coverage through the weekend in case of adverse outcome. I attempted to call [name] to relay this plan this morning at 09:50 but no answer, will attempt again later. PT/OT also to see, given concern for pt weaker than when he was DC from rehab at [facility] at which point he was ambulating.  01/10:  await void trial Mon 01/12   Consultants:  Urology  Procedures/Surgeries: none      ASSESSMENT & PLAN:   Urinary Tract Infection UCX (+)Ecoli multiple resistances Altered mental status / metabolic encephalopathy d/t UTI - resolved  UTI POA associated w/ catheter which was also POA  IV Ceftriaxone , option for IV gentamicin x1 but since he will be in the hospital anyway will continue ceftriaxone  for now  Given pt's urinary retention, he has foley catheter. Plan was to follow up with urology on 06/16/24 for voiding trial but did not go d/t weakness. Foley exchanged here 01/07 Of note, pt's wife over the phone was concerned about black mold in there she notes there was small black round balls coming out the urethra around the catheter and down the groin in the ED yesterday - no noted material that matches this description though there was noted to be significant sediment/debris.   Urinary retention Of note, pt's wife Troy Morse has requested void trial here stating she does not want to take him home until he's urinating on his own.  Urology consult to discuss plan for void trial / other input  I have advised Troy Morse that IF a void trial is attempted but he is unable to void independently then will need to re-place Foley and will plan release him from hospital to follow outpatient if he is medically stable. I have advised her also that either way that I will defer to urology re: void trial given he is a complicated catheter placement. Urology consult - recommendation is for outpatient void trial but there is  concern re transportation and mobility issues so will plan for void trial here Monday, will have to be Monday given limited availability of urology coverage through the weekend in case of adverse outcome. I attempted to call [name] to relay this plan this morning at 09:50 but no answer, will attempt again later.  Continue finasteride     Type 2 diabetes w/ hyperglycemia   Uncontrolled diabetes may be leading to recurrent UTI. Needs good control of his diabetes, discussed extensively with wife and pt.  Eating better, resumed achs and basal insulin  Holding home metformin .   Continue home gabapentin  for neuropathy.   Aspiration event witnessed by RN 06/18/24 Potential aspiration pneumonitis but no symptoms Aspiration d/t encephalopathy, which has resolved Given CVA remains aspiration risk especially w/ other causes for weakness (currently, infection) Issue has resolved now that pt is more alert, he is tolerating diet  SLP eval --> regular diet IV fluids x1L for now   RUE swelling w/o tenderness or pitting edema Eliquis  - unlikely clot Suspect dependent edema pt keeps this arm tight against his side - prop up in a slightly winged out abduction using pillows, on eliquis  unlikely DVT but will obtain imaging question some other sort stenosis if this isn't rsolving   Hx CVA w/ residual deficits  See [facility] admission 10/2023 Of note, PFO on echo, per neurology this was NOT contributory to CVA, see consult note 11/02/2023 Secondary prevention: Statin, eliquis , Glc control, BP control   Hx PE 11/2023 eliquis   Essential Hypertension   Not on any pharmacotherapy  History of hypotension.   continue to monitor given active infection.   Holding home midodrine 2.5 mg 3 times daily.   Hyperlipidemia/CAD:  Continue home statin.   CKD 3A: At baseline.   Renally dose medications as needed, avoid nephrotoxic agents. Monitor BMP   GAD Continue home clonazepam , SSRI    GERD:  Continue home PPI   Hx of PE: on elquis,  continue home eliquis .    Debility: 2/2 to strokes since July.  Bed bound since stroke.  need continued PT/OT as he gets home PT/OT.  PT/OT consulted. Qualifies for rehab  Of note, he was ambulatory on discharge from [facility] rehab - w/ yes/no questions I ascertained that pt has not been getting up out of med much since being back home. Suspect  worsening weakness d/t deconditioning + current UTI complicated of course by underlying CVA deficits  W/ yes/no questions, pt is amenable to rehab    Diarrhea: Appears to be chronic.  C. Diff negative.  GI panel also negative Monitor      Overweight based on BMI: Body mass index is 27.39 kg/m.SABRA Significantly low or high BMI is associated with higher medical risk.  Underweight - under 18  overweight - 25 to 29 obese - 30 or more Class 1 obesity: BMI of 30.0 to 34 Class 2 obesity: BMI of 35.0 to 39 Class 3 obesity: BMI of 40.0 to 49 Super Morbid Obesity: BMI 50-59 Super-super Morbid Obesity: BMI 60+ Healthy nutrition and physical activity advised as adjunct to other disease management and risk reduction treatments    DVT prophylaxis: eliquis  IV fluids: NS continuous IV fluids  x1L Nutrition: regular (S/p SLP eval) Central lines / other devices: Foley cath changed out today 06/21/2024   Code Status: DNR ACP documentation reviewed:  none on file in [facility]  TOC needs: likely for SNF rehab placement  Barriers to dispo: at this point awaiting foley removal and void trial  Monday (today is Friday) see above. At any rate he is likely to be here until SNF can be arranged.

## 2024-06-18 NOTE — Progress Notes (Addendum)
 " PROGRESS NOTE    Troy Morse   FMW:[id number] DOB: 1961/05/09  DOA: 06/17/2024 Date of Service: 06/18/2024 which is hospital day 0  PCP: [name], MD    Hospital course / significant events:   HPI: Troy Morse is a 64 y.o. year old male with medical history of hypertension, hyperlipidemia, type 2 diabetes, history of CVA, history of PE on Eliquis  who presented to the ED with increasing weakness and somnolence.  Patient with poor functional status who has chronic foley catheter and later treatment for UTI.  His last catheter was placed 6 weeks ago. Per wife, he was supposed to have urology appointment to remove catheter but cancelled due to weakness. He denies any fevers or chills.   01/06: On arrival to the ED patient was noted to be HDS stable.  Lab work and imaging obtained.  CBC without leukocytosis but WBC at upper limit of normal, CMP with moderate hyperglycemia but otherwise unremarkable.  Respiratory panel negative for COVID, flu, RSV.  UA consistent with urinary tract infection.  Urine culture pending.  Patient given 1 dose of ceftriaxone , appears last culture showed E. coli.  Admitted to hospitalist.  01/07: UCx again (+)Ecoli, pending susceptibilities. Aspiration event this morning, question vomiting. NPO pending SLP eval, continue NS fluids. Foley exchanged today    Consultants:  none  Procedures/Surgeries: none      ASSESSMENT & PLAN:   Urinary Tract Infection UCX (+)Ecoli pend susceptibilities  Altered mental status / metabolic encephalopathy d/t UTI UTI POA associated w/ catheter POA  IV Ceftriaxone  Urine and blood culture pending.   Given pt's urinary retention, he has foley catheter. Plan was to follow up with urology on 06/16/24 for voiding trial but did not go d/t weakness. Foley exchanged here 06/18/2024  Of note, pt's wife concerned about black mold in there she notes there was small black round balls coming out the urethra around the catheter  and down the groin in the ED yesterday - no noted material that matches this description though there is noted to be significant sediment/debris    Type 2 diabetes w/ hyperglycemia  Uncontrolled diabetes may be leading to recurrent UTI. Needs good control of his diabetes, discussed extensively with wife and pt.  Continue home glargine start SSI q4h until taking consistent meals  Holding home metformin .   Continue home gabapentin  for neuropathy.   Aspiration event witnessed by RN Likely aspiration pneumonitis Cont abx for UTI w/ ceftriaxone , consider for anaerobic coverage but no lobar pna and likely pneumonitis so will hold off on expanding abx at this time unless clinically worse  SLP eval --> regular diet IV fluids x1L for now   Essential Hypertension   Not on any pharmacotherapy  History of hypotension.   continue to monitor given active infection.   Holding home midodrine 2.5 mg 3 times daily.   Hyperlipidemia/CAD:  Continue home statin.   CKD 3A: At baseline.   Renally dose medications as needed, avoid nephrotoxic agents. Monitor BMP   GAD Continue home clonazepam , SSRI   BPH Urinary retention with chronc Foley Continue home finasteride     GERD:  Continue home PPI   Hx of PE: on elquis,  continue home eliquis .    Debility: 2/2 to strokes since July.  Bed bound since stroke.  need continued PT/OT as he gets home PT/OT.  PT/OT consulted.    Diarrhea: Appears to be chronic.  C. Diff negative.  GI panel also negative Monitor  Overweight based on BMI: Body mass index is 26.29 kg/m.SABRA Significantly low or high BMI is associated with higher medical risk.  Underweight - under 18  overweight - 25 to 29 obese - 30 or more Class 1 obesity: BMI of 30.0 to 34 Class 2 obesity: BMI of 35.0 to 39 Class 3 obesity: BMI of 40.0 to 49 Super Morbid Obesity: BMI 50-59 Super-super Morbid Obesity: BMI 60+ Healthy nutrition and physical activity advised as adjunct to  other disease management and risk reduction treatments    DVT prophylaxis: eliquis  IV fluids: NS continuous IV fluids  x1L Nutrition: regular (S/p SLP eval) Central lines / other devices: Foley cath changed out today 06/18/2024   Code Status: DNR ACP documentation reviewed:  none on file in [facility]  TOC needs: TBD Medical barriers to dispo: UTi. Expected medical readiness for discharge 2-3 days.              Subjective / Brief ROS:  Patient not contributory  Family Communication: call to pt's wife [name] 06/18/2024 5:19 PM     Objective Findings:  Vitals:   06/18/24 1312 06/18/24 1315 06/18/24 1452 06/18/24 1630  BP:  122/71  122/69  Pulse:  90  75  Resp: 20   17  Temp: (!) 100.7 F (38.2 C)  98.7 F (37.1 C) 97.6 F (36.4 C)  TempSrc: Oral  Oral   SpO2:  93%  95%  Weight:      Height:        Intake/Output Summary (Last 24 hours) at 06/18/2024 1718 Last data filed at 06/18/2024 1527 Gross per 24 hour  Intake 356.41 ml  Output 850 ml  Net -493.59 ml   Filed Weights   06/17/24 1317 06/18/24 0405  Weight: 100 kg 83.1 kg    Examination:  Physical Exam Constitutional:      General: He is not in acute distress. Cardiovascular:     Rate and Rhythm: Normal rate and regular rhythm.  Pulmonary:     Effort: Pulmonary effort is normal.     Breath sounds: Normal breath sounds.  Neurological:     Mental Status: He is alert. He is disoriented.     Comments: (Per pt's wife - not conversational at baseline but able to answer yes/no and more alert.) Currently pt is alert but not answering any questions, appears sleepy but easily awakens. Unable to completely assess neuro exam re new weakness as he is not following commands           Scheduled Medications:   apixaban   5 mg Oral BID   aspirin  EC  81 mg Oral Daily   atorvastatin   40 mg Oral Daily   calcium -vitamin D  1 tablet Oral Q breakfast   Chlorhexidine  Gluconate Cloth  6 each Topical Daily    clonazePAM   0.5 mg Oral Daily   cyanocobalamin   1,000 mcg Oral Daily   finasteride   5 mg Oral Daily   gabapentin   300 mg Oral QPM   insulin  aspart  0-20 Units Subcutaneous Q4H   insulin  aspart  5 Units Subcutaneous Q4H   insulin  glargine  30 Units Subcutaneous Daily   latanoprost   1 drop Both Eyes QHS   pantoprazole   40 mg Oral Daily   sertraline   150 mg Oral Daily   sodium chloride  flush  3 mL Intravenous Q12H    Continuous Infusions:  sodium chloride      cefTRIAXone  (ROCEPHIN )  IV Stopped (06/18/24 1338)   promethazine  (PHENERGAN ) injection (IM or  IVPB) Stopped (06/18/24 1056)    PRN Medications:  acetaminophen  **OR** acetaminophen , dextrose , labetalol , ondansetron  **OR** ondansetron  (ZOFRAN ) IV, promethazine  (PHENERGAN ) injection (IM or IVPB), senna-docusate  Antimicrobials from admission:  Anti-infectives (From admission, onward)    Start     Dose/Rate Route Frequency Ordered Stop   06/18/24 1300  cefTRIAXone  (ROCEPHIN ) 1 g in sodium chloride  0.9 % 100 mL IVPB        1 g 200 mL/hr over 30 Minutes Intravenous Every 24 hours 06/18/24 1154     06/17/24 1515  cefTRIAXone  (ROCEPHIN ) 1 g in sodium chloride  0.9 % 100 mL IVPB        1 g 200 mL/hr over 30 Minutes Intravenous  Once 06/17/24 1508 06/17/24 1757           Data Reviewed:  I have personally reviewed the following...  CBC: Recent Labs  Lab 06/17/24 1405 06/18/24 0455 06/18/24 1529  WBC 10.1 9.2 8.6  HGB 13.0 12.9* 13.0  HCT 37.2* 37.4* 38.4*  MCV 93.7 93.3 94.6  PLT 156 153 145*   Basic Metabolic Panel: Recent Labs  Lab 06/17/24 1405 06/17/24 1455 06/18/24 0455 06/18/24 1529  NA 137  --  135 135  K 4.1  --  4.1 4.5  CL 100  --  99 99  CO2 25  --  24 25  GLUCOSE 355*  --  286* 405*  BUN 20  --  18 17  CREATININE 1.13  --  1.12 1.17  CALCIUM  10.1  --  9.8 9.8  MG  --  1.6*  --   --    GFR: Estimated Creatinine Clearance: 65.9 mL/min (by C-G formula based on SCr of 1.17 mg/dL). Liver  Function Tests: Recent Labs  Lab 06/17/24 1405  AST 23  ALT 32  ALKPHOS 79  BILITOT 0.4  PROT 6.9  ALBUMIN 4.0   No results for input(s): LIPASE, AMYLASE in the last 168 hours. No results for input(s): AMMONIA in the last 168 hours. Coagulation Profile: No results for input(s): INR, PROTIME in the last 168 hours. Cardiac Enzymes: No results for input(s): CKTOTAL, CKMB, CKMBINDEX, TROPONINI in the last 168 hours. BNP (last 3 results) No results for input(s): PROBNP in the last 8760 hours. HbA1C: Recent Labs    06/17/24 1405  HGBA1C 9.4*   CBG: Recent Labs  Lab 06/18/24 0807 06/18/24 0908 06/18/24 1115 06/18/24 1142 06/18/24 1604  GLUCAP 300* 299* 289* 290* 376*   Lipid Profile: No results for input(s): CHOL, HDL, LDLCALC, TRIG, CHOLHDL, LDLDIRECT in the last 72 hours. Thyroid Function Tests: No results for input(s): TSH, T4TOTAL, FREET4, T3FREE, THYROIDAB in the last 72 hours. Anemia Panel: No results for input(s): VITAMINB12, FOLATE, FERRITIN, TIBC, IRON, RETICCTPCT in the last 72 hours. Most Recent Urinalysis On File:     Component Value Date/Time   COLORURINE YELLOW (A) 06/17/2024 1439   APPEARANCEUR CLOUDY (A) 06/17/2024 1439   LABSPEC 1.015 06/17/2024 1439   PHURINE 5.0 06/17/2024 1439   GLUCOSEU >=500 (A) 06/17/2024 1439   HGBUR LARGE (A) 06/17/2024 1439   BILIRUBINUR NEGATIVE 06/17/2024 1439   KETONESUR NEGATIVE 06/17/2024 1439   PROTEINUR 100 (A) 06/17/2024 1439   NITRITE POSITIVE (A) 06/17/2024 1439   LEUKOCYTESUR MODERATE (A) 06/17/2024 1439   Sepsis Labs: @LABRCNTIP (procalcitonin:4,lacticidven:4) Microbiology: Recent Results (from the past 240 hours)  Resp panel by RT-PCR (RSV, Flu A&B, Covid) Anterior Nasal Swab     Status: None   Collection Time: 06/17/24  2:05 PM   Specimen:  Anterior Nasal Swab  Result Value Ref Range Status   SARS Coronavirus 2 by RT PCR NEGATIVE NEGATIVE Final     Comment: (NOTE) SARS-CoV-2 target nucleic acids are NOT DETECTED.  The SARS-CoV-2 RNA is generally detectable in upper respiratory specimens during the acute phase of infection. The lowest concentration of SARS-CoV-2 viral copies this assay can detect is 138 copies/mL. A negative result does not preclude SARS-Cov-2 infection and should not be used as the sole basis for treatment or other patient management decisions. A negative result may occur with  improper specimen collection/handling, submission of specimen other than nasopharyngeal swab, presence of viral mutation(s) within the areas targeted by this assay, and inadequate number of viral copies(<138 copies/mL). A negative result must be combined with clinical observations, patient history, and epidemiological information. The expected result is Negative.  Fact Sheet for Patients:  bloggercourse.com  Fact Sheet for Healthcare Providers:  seriousbroker.it  This test is no t yet approved or cleared by the [place]  FDA and  has been authorized for detection and/or diagnosis of SARS-CoV-2 by FDA under an Emergency Use Authorization (EUA). This EUA will remain  in effect (meaning this test can be used) for the duration of the COVID-19 declaration under Section 564(b)(1) of the Act, 21 U.S.C.section 360bbb-3(b)(1), unless the authorization is terminated  or revoked sooner.       Influenza A by PCR NEGATIVE NEGATIVE Final   Influenza B by PCR NEGATIVE NEGATIVE Final    Comment: (NOTE) The Xpert Xpress SARS-CoV-2/FLU/RSV plus assay is intended as an aid in the diagnosis of influenza from Nasopharyngeal swab specimens and should not be used as a sole basis for treatment. Nasal washings and aspirates are unacceptable for Xpert Xpress SARS-CoV-2/FLU/RSV testing.  Fact Sheet for Patients: bloggercourse.com  Fact Sheet for Healthcare  Providers: seriousbroker.it  This test is not yet approved or cleared by the [place]  FDA and has been authorized for detection and/or diagnosis of SARS-CoV-2 by FDA under an Emergency Use Authorization (EUA). This EUA will remain in effect (meaning this test can be used) for the duration of the COVID-19 declaration under Section 564(b)(1) of the Act, 21 U.S.C. section 360bbb-3(b)(1), unless the authorization is terminated or revoked.     Resp Syncytial Virus by PCR NEGATIVE NEGATIVE Final    Comment: (NOTE) Fact Sheet for Patients: bloggercourse.com  Fact Sheet for Healthcare Providers: seriousbroker.it  This test is not yet approved or cleared by the [place]  FDA and has been authorized for detection and/or diagnosis of SARS-CoV-2 by FDA under an Emergency Use Authorization (EUA). This EUA will remain in effect (meaning this test can be used) for the duration of the COVID-19 declaration under Section 564(b)(1) of the Act, 21 U.S.C. section 360bbb-3(b)(1), unless the authorization is terminated or revoked.  Performed at [facility], [address], [place], [place] [address]   Urine Culture     Status: Abnormal (Preliminary result)   Collection Time: 06/17/24  2:39 PM   Specimen: Urine, Catheterized  Result Value Ref Range Status   Specimen Description   Final    URINE, CATHETERIZED Performed at [facility], [address], [place], [place] [address]    Special Requests   Final    NONE Performed at [facility], [address] [place], [place] [address]    Culture (A)  Final    >=100,000 COLONIES/mL ESCHERICHIA COLI SUSCEPTIBILITIES TO FOLLOW Performed at [facility], [address], [place], [place] [address]  Report Status PENDING  Incomplete  C Difficile Quick Screen w PCR reflex     Status: None   Collection Time: 06/17/24  2:39 PM    Specimen: STOOL  Result Value Ref Range Status   C Diff antigen NEGATIVE NEGATIVE Final   C Diff toxin NEGATIVE NEGATIVE Final   C Diff interpretation No C. difficile detected.  Final    Comment: Performed at [facility], [address] [place], [place] [address]  Gastrointestinal Panel by PCR , Stool     Status: None   Collection Time: 06/17/24  2:39 PM   Specimen: Stool  Result Value Ref Range Status   Campylobacter species NOT DETECTED NOT DETECTED Final   Plesimonas shigelloides NOT DETECTED NOT DETECTED Final   Salmonella species NOT DETECTED NOT DETECTED Final   Yersinia enterocolitica NOT DETECTED NOT DETECTED Final   Vibrio species NOT DETECTED NOT DETECTED Final   Vibrio cholerae NOT DETECTED NOT DETECTED Final   Enteroaggregative E coli (EAEC) NOT DETECTED NOT DETECTED Final   Enteropathogenic E coli (EPEC) NOT DETECTED NOT DETECTED Final   Enterotoxigenic E coli (ETEC) NOT DETECTED NOT DETECTED Final   Shiga like toxin producing E coli (STEC) NOT DETECTED NOT DETECTED Final   Shigella/Enteroinvasive E coli (EIEC) NOT DETECTED NOT DETECTED Final   Cryptosporidium NOT DETECTED NOT DETECTED Final   Cyclospora cayetanensis NOT DETECTED NOT DETECTED Final   Entamoeba histolytica NOT DETECTED NOT DETECTED Final   Giardia lamblia NOT DETECTED NOT DETECTED Final   Adenovirus F40/41 NOT DETECTED NOT DETECTED Final   Astrovirus NOT DETECTED NOT DETECTED Final   Norovirus GI/GII NOT DETECTED NOT DETECTED Final   Rotavirus A NOT DETECTED NOT DETECTED Final   Sapovirus (I, II, IV, and V) NOT DETECTED NOT DETECTED Final    Comment: Performed at [facility], [address], [place], [place] [address]      Radiology Studies last 3 days: [facility] Chest Port 1 View Result Date: 06/18/2024 CLINICAL DATA:  Aspiration into the airway. EXAM: PORTABLE CHEST 1 VIEW COMPARISON:  06/17/2024 FINDINGS: Patchy and linear density in the left lower lobe. The remainder of the lungs are  clear with normal vascularity. Normal-sized heart. Aortic arch atheromatous calcifications. Unremarkable bones. IMPRESSION: Left lower lobe atelectasis and possible aspiration pneumonitis. Electronically Signed   By: Elspeth Bathe M.D.   On: 06/18/2024 15:17   DG Chest Portable 1 View Result Date: 06/17/2024 CLINICAL DATA:  Weakness. EXAM: PORTABLE CHEST 1 VIEW COMPARISON:  01/06/2018. FINDINGS: The heart size and mediastinal contours are within normal limits. Low lung volumes with bronchovascular crowding. No focal consolidation, sizeable pleural effusion, or pneumothorax. No acute osseous abnormality. IMPRESSION: Low lung volumes.  No acute cardiopulmonary findings. Electronically Signed   By: Harrietta Sherry M.D.   On: 06/17/2024 13:56       Time spent: 50 min     Laneta Blunt, DO [facility] 06/18/2024, 5:18 PM    Dictation software may have been used to generate the above note. Typos may occur and escape review in typed/dictated notes. Please contact Dr Blunt directly for clarity if needed.  Staff may message me via secure chat in Epic  but this may not receive an immediate response,  please page me for urgent matters!  If 7PM-7AM, please contact night coverage [email or web address]       "

## 2024-06-18 NOTE — Inpatient Diabetes Management (Addendum)
 Inpatient Diabetes Program Recommendations  AACE/ADA: New Consensus Statement on Inpatient Glycemic Control (2015)  Target Ranges:  Prepandial:   less than 140 mg/dL      Peak postprandial:   less than 180 mg/dL (1-2 hours)      Critically ill patients:  140 - 180 mg/dL    Latest Reference Range & Units 06/17/24 14:05  Hemoglobin A1C 4.8 - 5.6 % 9.4 (H)  223 mg/dl  (H): Data is abnormally high  Latest Reference Range & Units 06/17/24 20:24 06/18/24 08:07 06/18/24 09:08  Glucose-Capillary 70 - 99 mg/dL 619 (H)  5 units Novolog   22 units Lantus  300 (H)  8 units Novolog   299 (H)   (H): Data is abnormally high   Admit Complicated UTI  Home Meds: Jardiance  25 mg daily            Lantus  22 units daily            Metformin  1000 mg at HS            Tresiba 88-98 units daily??  Current Orders: Lantus  22 units at HS     Novolog  Moderate Correction Scale/ SSI (0-15 units) TID AC + HS   MD- Note CBG 299 this AM  Please consider increasing the Lantus  to 30 units daily at [facility]    Telemedicine visit with PCP on 05/19/2024 after admission at [facility] for DKA. Was told to Increase Lantus  to 50 units at HS and Take Humalog 12 units TID Follow up with ENDO 06/06/2024??   Addendum 12:30pm--Went by to speak with pt but he is unable to have conversation due to previous CVA.  Called pt's wife and she told me she is his full time care taker and gives all meds.  Clarified home insulin  regimen: Lantus  22-24 units daily + Humalog 5-20 units TID per SSI.  Wife told me they do not wish to follow up with ENDO (Dr. Cherilyn) and would rather PCP manage pt's diabetes.  Wife told me she did not adjust pt's insulins at the telemedicine visit on 05/19/2024 after discussion with the PCP.  Pt normally wears FSL3 CGM so wife can monitor glucose levels closely.  Explained to wife that we started Lantus  and Novolog  last PM and will adjust daily as needed based on CBGs.  Wife very appreciative of call and  info.    --Will follow patient during hospitalization--  Adina Rudolpho Arrow RN, MSN, CDCES Diabetes Coordinator Inpatient Glycemic Control Team Team Pager: [phone] (8a-5p)

## 2024-06-18 NOTE — Progress Notes (Signed)
 SLP Cancellation Note  Patient Details Name: Troy Morse MRN: [id number] DOB: 1961-03-13   Cancelled treatment:       Reason Eval/Treat Not Completed: Medical issues which prohibited therapy (Chart review completed. Noted pt known to ST services at [facility] with most recent clinical swallowing evaluation (01/03/24) and MBSS (01/16/22) recommending regular diet with thin liquids. Pt with nausea, vomiting, and diaphoresis this AM. Will defer clinical swallowing evaluation at this time and continue efforts as appropriate.)  Delon Bangs, M.S., CCC-SLP Speech-Language Pathologist [facility] [phone] [place])  [name] 06/18/2024, 10:29 AM

## 2024-06-18 NOTE — Evaluation (Signed)
 Occupational Therapy Evaluation Patient Details Name: Troy Morse MRN: [id number] DOB: June 21, 1960 Today's Date: 06/18/2024   History of Present Illness   Pt is a 64 y/o M admitted on 06/17/24 after presenting with c/o increasing weakness & somnolence. Pt is being treated for complicated UTI. PMH: HTN, HLD, DM2, CVA, PE, chronic renal disease   Clinical Impressions Mr Troy Morse was seen for OT evaluation this date. Prior to hospital admission, pt was recently d/c from [facility] inpatient rehab 05/15/24 and has been largely bed bound since, can assist with toileitng in standing using sit to stand lift. This date pt states ~3 words total, smiles appropriately to conversation, and volitional movement noted in LUE only (3-/5).   Pt currently requires TOTAL A x2 sup<>sit and sit<>stand with B knee block for clean linen change. MOD A sitting balance ~8 min including reaching LUE towards target x3 trials. Pt would benefit from skilled OT to address noted impairments and functional limitations (see below for any additional details). Upon hospital discharge, recommend hospital bed and hoyer lift for safe return home to decrease caregiver burden.     If plan is discharge home, recommend the following:   A lot of help with walking and/or transfers;A lot of help with bathing/dressing/bathroom     Functional Status Assessment   Patient has had a recent decline in their functional status and demonstrates the ability to make significant improvements in function in a reasonable and predictable amount of time.     Equipment Recommendations   Hospital bed;Hoyer lift     Recommendations for Other Services         Precautions/Restrictions   Precautions Precautions: Fall Recall of Precautions/Restrictions: Impaired Restrictions Weight Bearing Restrictions Per Provider Order: No     Mobility Bed Mobility Overal bed mobility: Needs Assistance Bed Mobility: Supine to Sit, Sit to Supine      Supine to sit: Total assist, +2 for physical assistance, +2 for safety/equipment, Used rails, HOB elevated Sit to supine: Total assist, +2 for physical assistance, +2 for safety/equipment, HOB elevated, Used rails        Transfers Overall transfer level: Needs assistance Equipment used: 2 person hand held assist Transfers: Sit to/from Stand Sit to Stand: Total assist, +2 physical assistance, +2 safety/equipment, From elevated surface                  Balance Overall balance assessment: Needs assistance Sitting-balance support: Bilateral upper extremity supported Sitting balance-Leahy Scale: Poor   Postural control: Right lateral lean Standing balance support: Bilateral upper extremity supported Standing balance-Leahy Scale: Zero                             ADL either performed or assessed with clinical judgement   ADL Overall ADL's : Needs assistance/impaired                                       General ADL Comments: TOTAL A x2 don/doff gown in sitting     Vision         Perception         Praxis         Pertinent Vitals/Pain Pain Assessment Pain Assessment: Faces Faces Pain Scale: Hurts little more Pain Location: generalized with mobility Pain Descriptors / Indicators: Grimacing Pain Intervention(s): Limited activity within patient's tolerance, Repositioned  Extremity/Trunk Assessment Upper Extremity Assessment Upper Extremity Assessment: LUE deficits/detail;RUE deficits/detail RUE Deficits / Details: 1/5 grossly LUE Deficits / Details: 3-/5 grossly   Lower Extremity Assessment Lower Extremity Assessment: Defer to PT evaluation LLE Deficits / Details: -20 degrees from full knee extension PROM       Communication Communication Communication: Impaired Factors Affecting Communication: Difficulty expressing self;Reduced clarity of speech   Cognition Arousal: Alert Behavior During Therapy: WFL for tasks  assessed/performed Cognition: Difficult to assess Difficult to assess due to: Impaired communication           OT - Cognition Comments: aphasic; states 2 words during session                 Following commands: Impaired       Cueing  General Comments   Cueing Techniques: Verbal cues;Gestural cues  assisted pt with donning new gown 2/2 current one soiled 2/2 sweat   Exercises     Shoulder Instructions      Home Living Family/patient expects to be discharged to:: Private residence Living Arrangements: Spouse/significant other Available Help at Discharge: Family;Available 24 hours/day                         Home Equipment: Wheelchair - manual;Other (comment)   Additional Comments: sit to stand lift, adjustable bed      Prior Functioning/Environment Prior Level of Function : Needs assist             Mobility Comments: Pt requiring total assist to use sit<>stand lift to transfer to w/c but hasn't done this for weeks per wife. ADLs Comments: total a except does assist with brushing his teeth.    OT Problem List: Decreased strength;Decreased range of motion;Decreased activity tolerance;Impaired balance (sitting and/or standing);Decreased safety awareness   OT Treatment/Interventions: Self-care/ADL training;Therapeutic exercise;Energy conservation;DME and/or AE instruction;Therapeutic activities;Patient/family education      OT Goals(Current goals can be found in the care plan section)   Acute Rehab OT Goals Patient Stated Goal: to go home with equipment OT Goal Formulation: With patient/family Time For Goal Achievement: 07/02/24 Potential to Achieve Goals: Fair ADL Goals Pt Will Perform Grooming: with max assist;sitting Pt Will Perform Lower Body Dressing: bed level;with caregiver independent in assisting;with max assist;with adaptive equipment Pt Will Transfer to Toilet: with max assist;stand pivot transfer;bedside commode   OT Frequency:   Min 2X/week    Co-evaluation PT/OT/SLP Co-Evaluation/Treatment: Yes Reason for Co-Treatment: To address functional/ADL transfers;Complexity of the patient's impairments (multi-system involvement);For patient/therapist safety PT goals addressed during session: Mobility/safety with mobility;Balance OT goals addressed during session: ADL's and self-care      AM-PAC OT 6 Clicks Daily Activity     Outcome Measure Help from another person eating meals?: A Lot Help from another person taking care of personal grooming?: A Lot Help from another person toileting, which includes using toliet, bedpan, or urinal?: Total Help from another person bathing (including washing, rinsing, drying)?: Total Help from another person to put on and taking off regular upper body clothing?: Total Help from another person to put on and taking off regular lower body clothing?: Total 6 Click Score: 8   End of Session    Activity Tolerance: Patient tolerated treatment well Patient left: in bed;with call bell/phone within reach;with family/visitor present  OT Visit Diagnosis: Other abnormalities of gait and mobility (R26.89)                Time: 8567-8541 OT Time Calculation (min):  26 min Charges:  OT General Charges $OT Visit: 1 Visit OT Evaluation $OT Eval Moderate Complexity: 1 Mod OT Treatments $Self Care/Home Management : 8-22 mins  Elston Slot, M.S. OTR/L  06/18/2024, 3:17 PM  ascom [phone]

## 2024-06-18 NOTE — Evaluation (Addendum)
 Clinical/Bedside Swallow Evaluation Patient Details  Name: Troy Morse MRN: [id number] Date of Birth: 25-Dec-1960  Today's Date: 06/18/2024 Time: SLP Start Time (ACUTE ONLY): 1510 SLP Stop Time (ACUTE ONLY): 1525 SLP Time Calculation (min) (ACUTE ONLY): 15 min  Past Medical History:  Past Medical History:  Diagnosis Date   CRD (chronic renal disease)    Diabetes mellitus without complication (HCC)    GERD (gastroesophageal reflux disease)    Hypertension    Past Surgical History:  Past Surgical History:  Procedure Laterality Date   BACK SURGERY     BONE EXCISION Right 03/21/2019   Procedure: PART EXCISION BONE-PHALANX RIGHT GREAT TOE;  Surgeon: Ashley Soulier, DPM;  Location: [facility] ORS;  Service: Podiatry;  Laterality: Right;   CHOLECYSTECTOMY     EYE SURGERY     torn retina   HERNIA REPAIR     INCISION AND DRAINAGE Right 12/13/2018   Procedure: INCISION AND DRAINAGE GREAT RIGHT TOE;  Surgeon: Ashley Soulier, DPM;  Location: [facility] ORS;  Service: Podiatry;  Laterality: Right;   HPI:  Pt is a 64 y/o M admitted on 06/17/24 after presenting with c/o increasing weakness & somnolence. Pt is being treated for complicated UTI. PMH: HTN, HLD, DM2, CVA, PE, chronic renal disease. CXR, 06/19/23, Left lower lobe atelectasis and possible aspiration pneumonitis. (Noted pt s/p vomiting episode)   Assessment / Plan / Recommendation  Clinical Impression  Pt seen for clinical swallowing evaluation. Pt alert, limited verbalizations. Easily distracted by external stimuli. Difficulty following commands, but not obvious oral motor deficits. Pt demonstrated an intact oral swallow function. Immediate cough with sequential sips of thin liquids via straw. Wife, at bedside, noted this is baseline. No overt s/sx pharyngeal dysphagia with solids or single sips of thin liquids via cup or straw. Pt's wife politely declined need for further assessment of swallowing via instrumental means. Suspect pt is at or near  swallowing baseline given multiple ST evaluations in EMR with most recent in July 2025 recommending regular diet and thin liquids. Recommend resuming a regular diet and thin liquids with safe swallowing strategies including, but not limited to, standard aspiration precautions, single bites/sips with pinched straw or single cup sip, reducing environmental distractions, and 1:1 assistance with feeding. Pt is at increased risk for aspiration PNA given dependence for feeding, hx of stroke, cognitive deficits, and comorbidities.  SLP Visit Diagnosis: Dysphagia, pharyngeal phase (R13.13)    Aspiration Risk  Mild aspiration risk    Diet Recommendation Regular;Thin liquid    Liquid Administration via: Spoon;Cup;Straw Medication Administration:  (as tolerated) Supervision: Staff to assist with self feeding;Full supervision/cueing for compensatory strategies Compensations: Minimize environmental distractions;Slow rate;Small sips/bites (PINCH STRAW TO LIMIT RATE/VOLUME OF INTAKE) Postural Changes: Seated upright at 90 degrees;Remain upright for at least 30 minutes after po intake    Other Recommendations Oral Care Recommendations: Oral care QID     Functional Status Assessment Patient has not had a recent decline in their functional status    Swallow Study   General Date of Onset: 06/17/24 HPI: Pt is a 64 y/o M admitted on 06/17/24 after presenting with c/o increasing weakness & somnolence. Pt is being treated for complicated UTI. PMH: HTN, HLD, DM2, CVA, PE, chronic renal disease Type of Study: Bedside Swallow Evaluation Previous Swallow Assessment: multiple swallowing evaluation; most recently, CSE at [facility], 01/04/24, recommended regular and thin Diet Prior to this Study: NPO Temperature Spikes Noted: Yes (100.7, WBC WNL) Respiratory Status: Room air Behavior/Cognition: Alert;Pleasant mood;Cooperative;Distractible;Requires cueing Oral Cavity Assessment:  Within Functional Limits Oral Care Completed by  SLP: Recent completion by staff Oral Cavity - Dentition: Adequate natural dentition Vision:  (vision appeared functional) Patient Positioning: Upright in bed Baseline Vocal Quality: Low vocal intensity Volitional Cough: Cognitively unable to elicit Volitional Swallow: Unable to elicit    Oral/Motor/Sensory Function Overall Oral Motor/Sensory Function:  (no obvious deficits)   Ice Chips Ice chips: Not tested   Thin Liquid Thin Liquid: Impaired Presentation: Cup;Straw Oral Phase Impairments:  (WFL) Pharyngeal  Phase Impairments: Cough - Immediate Other Comments: immediate cough only with rapid, sequential sips of juice    Nectar Thick Nectar Thick Liquid: Not tested   Honey Thick Honey Thick Liquid: Not tested   Puree Puree: Not tested   Solid     Solid: Within functional limits     Troy Morse, M.S., CCC-SLP Speech-Language Pathologist [facility] [phone] (ASCOM)  Troy HERO Troy Morse 06/18/2024,3:47 PM

## 2024-06-18 NOTE — Progress Notes (Signed)
 OT Cancellation Note  Patient Details Name: AMELIA MACKEN MRN: [id number] DOB: 02-Oct-1960   Cancelled Treatment:    Reason Eval/Treat Not Completed: Medical issues which prohibited therapy. Chart reviewed, on arrival pt in bed, vomiting & diaphoretic, RN called to room & attending to pt. Will re-attempt eval as able.   Elston Slot, M.S. OTR/L  06/18/2024, 10:39 AM  ascom [phone]

## 2024-06-18 NOTE — TOC CM/SW Note (Signed)
 Patient has had a stroke which requires his body to be positioned in ways not feasible with a normal bed. Head must be elevated at least 30 degrees. The patient's medical condition requires frequent changes in body position which cannot be achieved with a normal bed.

## 2024-06-19 DIAGNOSIS — N401 Enlarged prostate with lower urinary tract symptoms: Secondary | ICD-10-CM | POA: Diagnosis present

## 2024-06-19 DIAGNOSIS — E785 Hyperlipidemia, unspecified: Secondary | ICD-10-CM | POA: Diagnosis present

## 2024-06-19 DIAGNOSIS — N390 Urinary tract infection, site not specified: Secondary | ICD-10-CM | POA: Diagnosis present

## 2024-06-19 DIAGNOSIS — Z1624 Resistance to multiple antibiotics: Secondary | ICD-10-CM | POA: Diagnosis present

## 2024-06-19 DIAGNOSIS — B9620 Unspecified Escherichia coli [E. coli] as the cause of diseases classified elsewhere: Secondary | ICD-10-CM | POA: Diagnosis present

## 2024-06-19 DIAGNOSIS — I2510 Atherosclerotic heart disease of native coronary artery without angina pectoris: Secondary | ICD-10-CM | POA: Diagnosis present

## 2024-06-19 DIAGNOSIS — Q2112 Patent foramen ovale: Secondary | ICD-10-CM | POA: Diagnosis not present

## 2024-06-19 DIAGNOSIS — N400 Enlarged prostate without lower urinary tract symptoms: Secondary | ICD-10-CM | POA: Diagnosis not present

## 2024-06-19 DIAGNOSIS — Y846 Urinary catheterization as the cause of abnormal reaction of the patient, or of later complication, without mention of misadventure at the time of the procedure: Secondary | ICD-10-CM | POA: Diagnosis present

## 2024-06-19 DIAGNOSIS — T83511A Infection and inflammatory reaction due to indwelling urethral catheter, initial encounter: Secondary | ICD-10-CM | POA: Diagnosis present

## 2024-06-19 DIAGNOSIS — E1165 Type 2 diabetes mellitus with hyperglycemia: Secondary | ICD-10-CM | POA: Diagnosis present

## 2024-06-19 DIAGNOSIS — E663 Overweight: Secondary | ICD-10-CM | POA: Diagnosis present

## 2024-06-19 DIAGNOSIS — N1831 Chronic kidney disease, stage 3a: Secondary | ICD-10-CM | POA: Diagnosis present

## 2024-06-19 DIAGNOSIS — E1122 Type 2 diabetes mellitus with diabetic chronic kidney disease: Secondary | ICD-10-CM | POA: Diagnosis present

## 2024-06-19 DIAGNOSIS — R197 Diarrhea, unspecified: Secondary | ICD-10-CM | POA: Diagnosis present

## 2024-06-19 DIAGNOSIS — Z794 Long term (current) use of insulin: Secondary | ICD-10-CM | POA: Diagnosis not present

## 2024-06-19 DIAGNOSIS — E1142 Type 2 diabetes mellitus with diabetic polyneuropathy: Secondary | ICD-10-CM | POA: Diagnosis present

## 2024-06-19 DIAGNOSIS — Z7901 Long term (current) use of anticoagulants: Secondary | ICD-10-CM | POA: Diagnosis not present

## 2024-06-19 DIAGNOSIS — F411 Generalized anxiety disorder: Secondary | ICD-10-CM | POA: Diagnosis present

## 2024-06-19 DIAGNOSIS — Z7984 Long term (current) use of oral hypoglycemic drugs: Secondary | ICD-10-CM | POA: Diagnosis not present

## 2024-06-19 DIAGNOSIS — Z87440 Personal history of urinary (tract) infections: Secondary | ICD-10-CM | POA: Diagnosis not present

## 2024-06-19 DIAGNOSIS — Z7401 Bed confinement status: Secondary | ICD-10-CM | POA: Diagnosis not present

## 2024-06-19 DIAGNOSIS — I129 Hypertensive chronic kidney disease with stage 1 through stage 4 chronic kidney disease, or unspecified chronic kidney disease: Secondary | ICD-10-CM | POA: Diagnosis present

## 2024-06-19 DIAGNOSIS — G9341 Metabolic encephalopathy: Secondary | ICD-10-CM | POA: Diagnosis present

## 2024-06-19 DIAGNOSIS — K219 Gastro-esophageal reflux disease without esophagitis: Secondary | ICD-10-CM | POA: Diagnosis present

## 2024-06-19 DIAGNOSIS — Z7982 Long term (current) use of aspirin: Secondary | ICD-10-CM | POA: Diagnosis not present

## 2024-06-19 DIAGNOSIS — Z66 Do not resuscitate: Secondary | ICD-10-CM | POA: Diagnosis present

## 2024-06-19 LAB — BASIC METABOLIC PANEL WITH GFR
Anion gap: 11 (ref 5–15)
BUN: 15 mg/dL (ref 8–23)
CO2: 27 mmol/L (ref 22–32)
Calcium: 9.8 mg/dL (ref 8.9–10.3)
Chloride: 103 mmol/L (ref 98–111)
Creatinine, Ser: 1.06 mg/dL (ref 0.61–1.24)
GFR, Estimated: >60 mL/min
Glucose, Bld: 84 mg/dL (ref 70–99)
Potassium: 3.5 mmol/L (ref 3.5–5.1)
Sodium: 141 mmol/L (ref 135–145)

## 2024-06-19 LAB — GLUCOSE, CAPILLARY
Glucose-Capillary: 79 mg/dL (ref 70–99)
Glucose-Capillary: 116 mg/dL — ABNORMAL HIGH (ref 70–99)
Glucose-Capillary: 282 mg/dL — ABNORMAL HIGH (ref 70–99)
Glucose-Capillary: 264 mg/dL — ABNORMAL HIGH (ref 70–99)
Glucose-Capillary: 239 mg/dL — ABNORMAL HIGH (ref 70–99)

## 2024-06-19 LAB — CBC
HCT: 36.3 % — ABNORMAL LOW (ref 39.0–52.0)
Hemoglobin: 12.4 g/dL — ABNORMAL LOW (ref 13.0–17.0)
MCH: 32.1 pg (ref 26.0–34.0)
MCHC: 34.2 g/dL (ref 30.0–36.0)
MCV: 94 fL (ref 80.0–100.0)
Platelets: 152 K/uL (ref 150–400)
RBC: 3.86 MIL/uL — ABNORMAL LOW (ref 4.22–5.81)
RDW: 11.6 % (ref 11.5–15.5)
WBC: 7.8 K/uL (ref 4.0–10.5)
nRBC: 0 % (ref 0.0–0.2)

## 2024-06-19 LAB — URINE CULTURE: Culture: >=100000 — AB

## 2024-06-19 MED ORDER — SODIUM CHLORIDE 0.9 % IV SOLN
2.0000 g | INTRAVENOUS | Status: DC
  Administered 2024-06-19 – 2024-06-23 (×5): 2 g via INTRAVENOUS
  Filled 2024-06-19 (×6): qty 20

## 2024-06-19 MED ORDER — CLONAZEPAM 0.5 MG PO TABS
0.5000 mg | ORAL_TABLET | Freq: Two times a day (BID) | ORAL | Status: DC
  Administered 2024-06-19 – 2024-06-27 (×16): 0.5 mg via ORAL
  Filled 2024-06-19 (×16): qty 1

## 2024-06-19 MED ORDER — INSULIN ASPART 100 UNIT/ML IJ SOLN: 0.0000 [IU] | Freq: Three times a day (TID) | INTRAMUSCULAR | Status: DC

## 2024-06-19 MED ORDER — BRIMONIDINE TARTRATE 0.2 % OP SOLN
1.0000 [drp] | Freq: Three times a day (TID) | OPHTHALMIC | Status: DC
  Administered 2024-06-19 – 2024-06-27 (×25): 1 [drp] via OPHTHALMIC
  Filled 2024-06-19: qty 5

## 2024-06-19 MED ORDER — INSULIN ASPART 100 UNIT/ML IJ SOLN
0.0000 [IU] | Freq: Every day | INTRAMUSCULAR | Status: DC
  Administered 2024-06-19 – 2024-06-22 (×2): 2 [IU] via SUBCUTANEOUS
  Administered 2024-06-23: 3 [IU] via SUBCUTANEOUS
  Administered 2024-06-24: 2 [IU] via SUBCUTANEOUS
  Filled 2024-06-19 (×2): qty 2
  Filled 2024-06-19: qty 3
  Filled 2024-06-19: qty 2
  Filled 2024-06-19: qty 5

## 2024-06-19 MED ORDER — GABAPENTIN 300 MG PO CAPS
300.0000 mg | ORAL_CAPSULE | Freq: Two times a day (BID) | ORAL | Status: DC
  Administered 2024-06-19 – 2024-06-27 (×16): 300 mg via ORAL
  Filled 2024-06-19 (×16): qty 1

## 2024-06-19 MED ORDER — INSULIN ASPART 100 UNIT/ML IJ SOLN
0.0000 [IU] | Freq: Three times a day (TID) | INTRAMUSCULAR | Status: DC
  Administered 2024-06-19 – 2024-06-20 (×3): 11 [IU] via SUBCUTANEOUS
  Administered 2024-06-20: 7 [IU] via SUBCUTANEOUS
  Administered 2024-06-20: 11 [IU] via SUBCUTANEOUS
  Administered 2024-06-21: 4 [IU] via SUBCUTANEOUS
  Administered 2024-06-21: 7 [IU] via SUBCUTANEOUS
  Administered 2024-06-21: 4 [IU] via SUBCUTANEOUS
  Administered 2024-06-22 (×2): 11 [IU] via SUBCUTANEOUS
  Administered 2024-06-22 – 2024-06-23 (×2): 7 [IU] via SUBCUTANEOUS
  Administered 2024-06-23 (×2): 11 [IU] via SUBCUTANEOUS
  Administered 2024-06-24 (×2): 4 [IU] via SUBCUTANEOUS
  Administered 2024-06-24: 11 [IU] via SUBCUTANEOUS
  Administered 2024-06-25: 4 [IU] via SUBCUTANEOUS
  Administered 2024-06-25: 7 [IU] via SUBCUTANEOUS
  Administered 2024-06-25: 4 [IU] via SUBCUTANEOUS
  Administered 2024-06-26 (×2): 7 [IU] via SUBCUTANEOUS
  Administered 2024-06-26: 4 [IU] via SUBCUTANEOUS
  Administered 2024-06-27 (×3): 3 [IU] via SUBCUTANEOUS
  Filled 2024-06-19: qty 11
  Filled 2024-06-19: qty 4
  Filled 2024-06-19: qty 3
  Filled 2024-06-19: qty 4
  Filled 2024-06-19 (×2): qty 11
  Filled 2024-06-19: qty 4
  Filled 2024-06-19: qty 11
  Filled 2024-06-19: qty 4
  Filled 2024-06-19: qty 11
  Filled 2024-06-19: qty 3
  Filled 2024-06-19: qty 7
  Filled 2024-06-19: qty 11
  Filled 2024-06-19: qty 4
  Filled 2024-06-19 (×3): qty 7
  Filled 2024-06-19: qty 3
  Filled 2024-06-19: qty 7
  Filled 2024-06-19: qty 3
  Filled 2024-06-19: qty 7
  Filled 2024-06-19: qty 11
  Filled 2024-06-19: qty 7
  Filled 2024-06-19: qty 4
  Filled 2024-06-19 (×2): qty 11

## 2024-06-19 NOTE — TOC Progression Note (Signed)
 Transition of Care [facility]) - Progression Note    Patient Details  Name: PROPHET RENWICK MRN: [id number] Date of Birth: [birth year]  [facility]) CM/SW Contact  Daved JONETTA Hamilton, RN Phone Number: 06/19/2024, 2:14 PM  Clinical Narrative:     Attempted to return call to patient's spouse and POA Nicodemus Denk, lvmm requesting she call me back.  Expected Discharge Plan: Home w Home Health Services Barriers to Discharge: Continued Medical Work up               Expected Discharge Plan and Services   Discharge Planning Services: CM Consult Post Acute Care Choice: Durable Medical Equipment, Home Health Living arrangements for the past 2 months: Single Family Home                 DME Arranged: Hospital bed, Other see comment Lajean Lift) DME Agency: [employer]                   Social Drivers of Health (SDOH) Interventions SDOH Screenings   Food Insecurity: No Food Insecurity (06/17/2024)  Housing: Low Risk (06/17/2024)  Transportation Needs: No Transportation Needs (06/17/2024)  Utilities: Not At Risk (06/17/2024)  Financial Resource Strain: Low Risk  (04/29/2024)   Received from [facility]  Tobacco Use: Low Risk (06/17/2024)  Health Literacy: Adequate Health Literacy (05/15/2024)   Received from [facility]    Readmission Risk Interventions     No data to display

## 2024-06-19 NOTE — Inpatient Diabetes Management (Signed)
 Inpatient Diabetes Program Recommendations  AACE/ADA: New Consensus Statement on Inpatient Glycemic Control   Target Ranges:  Prepandial:   less than 140 mg/dL      Peak postprandial:   less than 180 mg/dL (1-2 hours)      Critically ill patients:  140 - 180 mg/dL    Latest Reference Range & Units 06/18/24 08:07 06/18/24 09:08 06/18/24 11:15 06/18/24 11:42 06/18/24 16:04 06/18/24 19:45 06/18/24 23:45 06/19/24 03:42 06/19/24 07:31  Glucose-Capillary 70 - 99 mg/dL 699 (H) 700 (H) 710 (H) 290 (H) 376 (H) 287 (H) 186 (H) 79 116 (H)   Review of Glycemic Control  Diabetes history: DM2 Outpatient Diabetes medications: Lantus  22-24 units daily, Humalog 5-20 units TID with meals Current orders for Inpatient glycemic control: Lantus  30 units daily, Novolog  0-20 units Q4H, Novolog  5 units Q4H for tube feeding coverage  Inpatient Diabetes Program Recommendations:    Insulin :  Noted order for Novolog  5 units Q4H (for tube feeding coverage). However, no tube feedings are ordered at this time. Ordered regular diet. Glucose up to 376 mg/dl yesterday but no meal intake documented.  If pt is not on any tube feeding, will need to d/c the Novolog  5 units Q4H. If patient is eating well, may need to add Novolog  5 units TID with meals if pt eats at least 50% of meals   Thanks, Earnie Gainer, RN, MSN, CDCES Diabetes Coordinator Inpatient Diabetes Program [phone] (Team Pager from 8am to 5pm)

## 2024-06-19 NOTE — Plan of Care (Signed)

## 2024-06-19 NOTE — Care Plan (Signed)
 Nursing secretary to order specialty bed

## 2024-06-19 NOTE — Progress Notes (Signed)
 " PROGRESS NOTE    JALENE LACKO   FMW:[id number] DOB: 26-Aug-1960  DOA: 06/17/2024 Date of Service: 06/19/2024 which is hospital day 0  PCP: [name], MD    Hospital course / significant events:   HPI: JERMEY CLOSS is a 64 y.o. year old male with medical history of hypertension, hyperlipidemia, type 2 diabetes, history of CVA, history of PE on Eliquis  who presented to the ED with increasing weakness and somnolence.  Patient with poor functional status who has chronic foley catheter and later treatment for UTI.  His last catheter was placed 6 weeks ago. Per wife, he was supposed to have urology appointment to remove catheter but cancelled due to weakness. He denies any fevers or chills.   01/06: On arrival to the ED patient was noted to be HDS stable.  Lab work and imaging obtained.  CBC without leukocytosis but WBC at upper limit of normal, CMP with moderate hyperglycemia but otherwise unremarkable.  Respiratory panel negative for COVID, flu, RSV.  UA consistent with urinary tract infection.  Urine culture pending.  Patient given 1 dose of ceftriaxone , appears last culture showed E. coli.  Admitted to hospitalist.  01/07: confusion/encephalopathic. UCx again (+)Ecoli, pending susceptibilities. Aspiration event this morning, question vomiting. NPO pending SLP eval, continue NS fluids. Foley exchanged today  01/08: much more alert / baseline. UCx (+)EColi multiple resistances, d/w ID pharmacist - plan for gentamicin dose and DC home however pt's wife requests he stays here for void trial, she would like to establish him w/ [facility] urologist instead of [facility]. D/w urology - will see pt, for now can continue w/ ceftriaxone    Consultants:  Urology  Procedures/Surgeries: none      ASSESSMENT & PLAN:   Urinary Tract Infection UCX (+)Ecoli multiple resistances Altered mental status / metabolic encephalopathy d/t UTI - resolved  UTI POA associated w/ catheter which was also POA  IV  Ceftriaxone  Given pt's urinary retention, he has foley catheter. Plan was to follow up with urology on 06/16/24 for voiding trial but did not go d/t weakness. Foley exchanged here 06/18/2024 Of note, pt's wife over the phone was concerned about black mold in there she notes there was small black round balls coming out the urethra around the catheter and down the groin in the ED yesterday - no noted material that matches this description though there was noted to be significant sediment/debris.   Urinary retention Of note, pt's wife [name] has requested void trial here stating she does not want to take him home until he's urinating on his own.  Urology consult to discuss plan for void trial / other input  I have advised [name] that IF a void trial is attempted but he is unable to void independently then will need to re-place Foley and will plan release him from hospital to follow outpatient if he is medically stable. I have advised her also that either way that I will defer to urology re: void trial given he is a complicated catheter placement.   Type 2 diabetes w/ hyperglycemia  Uncontrolled diabetes may be leading to recurrent UTI. Needs good control of his diabetes, discussed extensively with wife and pt.  Eating better today, resume achs and basal insulin  Holding home metformin .   Continue home gabapentin  for neuropathy.  Continue home finasteride   Aspiration event witnessed by RN 06/18/24 Potential aspiration pneumonitis but no symptoms Aspiration d/t encephalopathy, which has resolved Given CVA remains aspiration risk especially w/ other causes for  weakness (currently, infection) Cont abx for UTI w/ ceftriaxone , consider for anaerobic coverage but no lobar pna and likely pneumonitis so will hold off on expanding abx at this time unless clinically worse --> pt says no respiratory problems today  SLP eval --> regular diet IV fluids x1L for now   Essential Hypertension   Not on any  pharmacotherapy  History of hypotension.   continue to monitor given active infection.   Holding home midodrine 2.5 mg 3 times daily.   Hyperlipidemia/CAD:  Continue home statin.   CKD 3A: At baseline.   Renally dose medications as needed, avoid nephrotoxic agents. Monitor BMP   GAD Continue home clonazepam , SSRI    GERD:  Continue home PPI   Hx of PE: on elquis,  continue home eliquis .    Debility: 2/2 to strokes since July.  Bed bound since stroke.  need continued PT/OT as he gets home PT/OT.  PT/OT consulted.    Diarrhea: Appears to be chronic.  C. Diff negative.  GI panel also negative Monitor      Overweight based on BMI: Body mass index is 26.29 kg/m.SABRA Significantly low or high BMI is associated with higher medical risk.  Underweight - under 18  overweight - 25 to 29 obese - 30 or more Class 1 obesity: BMI of 30.0 to 34 Class 2 obesity: BMI of 35.0 to 39 Class 3 obesity: BMI of 40.0 to 49 Super Morbid Obesity: BMI 50-59 Super-super Morbid Obesity: BMI 60+ Healthy nutrition and physical activity advised as adjunct to other disease management and risk reduction treatments    DVT prophylaxis: eliquis  IV fluids: NS continuous IV fluids  x1L Nutrition: regular (S/p SLP eval) Central lines / other devices: Foley cath changed out today 06/18/2024   Code Status: DNR ACP documentation reviewed:  none on file in [facility]  TOC needs: TBD Medical barriers to dispo: urology consult. Expected medical readiness for discharge pending urology consult, possible tomorrow if no procedures planned / no other complications .              Subjective / Brief ROS:  Patient not contributory yesterday, today more alert and at least able to answer yes/no. Denies chest pain, trouble breathing, any other pain, any other problems right now   Family Communication: spoke w/ pt's wife [name] at bedside on rounds    Objective Findings:  Vitals:   06/18/24 1944 06/19/24  0341 06/19/24 0500 06/19/24 0735  BP: 137/78 138/72  132/81  Pulse: 72 67  72  Resp: 17 18  20   Temp: 98.4 F (36.9 C) 97.7 F (36.5 C)  98.7 F (37.1 C)  TempSrc: Oral Oral  Oral  SpO2: 95% 94%  96%  Weight:   86.7 kg   Height:        Intake/Output Summary (Last 24 hours) at 06/19/2024 1510 Last data filed at 06/19/2024 0349 Gross per 24 hour  Intake 770.54 ml  Output 650 ml  Net 120.54 ml   Filed Weights   06/17/24 1317 06/18/24 0405 06/19/24 0500  Weight: 100 kg 83.1 kg 86.7 kg    Examination:  Physical Exam Constitutional:      General: He is not in acute distress. Cardiovascular:     Rate and Rhythm: Normal rate and regular rhythm.  Pulmonary:     Effort: Pulmonary effort is normal.     Breath sounds: Normal breath sounds.  Musculoskeletal:     Right lower leg: No edema.     Left  lower leg: No edema.  Neurological:     Mental Status: He is alert. Mental status is at baseline.     Comments: (Per pt's wife - not conversational at baseline but able to answer yes/no and more alert.) alert today and answering yes/no questions, following commands  Psychiatric:        Behavior: Behavior normal.          Scheduled Medications:   apixaban   5 mg Oral BID   aspirin  EC  81 mg Oral Daily   atorvastatin   40 mg Oral Daily   brimonidine   1 drop Both Eyes Q8H   calcium -vitamin D  1 tablet Oral Q breakfast   Chlorhexidine  Gluconate Cloth  6 each Topical Daily   clonazePAM   0.5 mg Oral BID   cyanocobalamin   1,000 mcg Oral Daily   finasteride   5 mg Oral Daily   gabapentin   300 mg Oral BID   insulin  aspart  0-20 Units Subcutaneous TID WC   insulin  aspart  0-5 Units Subcutaneous QHS   insulin  glargine  30 Units Subcutaneous Daily   latanoprost   1 drop Both Eyes QHS   pantoprazole   40 mg Oral Daily   sertraline   150 mg Oral Daily   sodium chloride  flush  3 mL Intravenous Q12H    Continuous Infusions:  cefTRIAXone  (ROCEPHIN )  IV 2 g (06/19/24 1310)   promethazine   (PHENERGAN ) injection (IM or IVPB) Stopped (06/18/24 1056)    PRN Medications:  acetaminophen  **OR** acetaminophen , dextrose , labetalol , ondansetron  **OR** ondansetron  (ZOFRAN ) IV, promethazine  (PHENERGAN ) injection (IM or IVPB), senna-docusate  Antimicrobials from admission:  Anti-infectives (From admission, onward)    Start     Dose/Rate Route Frequency Ordered Stop   06/19/24 1400  cefTRIAXone  (ROCEPHIN ) 2 g in sodium chloride  0.9 % 100 mL IVPB        2 g 200 mL/hr over 30 Minutes Intravenous Every 24 hours 06/19/24 0950     06/18/24 1300  cefTRIAXone  (ROCEPHIN ) 1 g in sodium chloride  0.9 % 100 mL IVPB  Status:  Discontinued        1 g 200 mL/hr over 30 Minutes Intravenous Every 24 hours 06/18/24 1154 06/19/24 0950   06/17/24 1515  cefTRIAXone  (ROCEPHIN ) 1 g in sodium chloride  0.9 % 100 mL IVPB        1 g 200 mL/hr over 30 Minutes Intravenous  Once 06/17/24 1508 06/17/24 1757           Data Reviewed:  I have personally reviewed the following...  CBC: Recent Labs  Lab 06/17/24 1405 06/18/24 0455 06/18/24 1529 06/19/24 0505  WBC 10.1 9.2 8.6 7.8  HGB 13.0 12.9* 13.0 12.4*  HCT 37.2* 37.4* 38.4* 36.3*  MCV 93.7 93.3 94.6 94.0  PLT 156 153 145* 152   Basic Metabolic Panel: Recent Labs  Lab 06/17/24 1405 06/17/24 1455 06/18/24 0455 06/18/24 1529 06/19/24 0505  NA 137  --  135 135 141  K 4.1  --  4.1 4.5 3.5  CL 100  --  99 99 103  CO2 25  --  24 25 27   GLUCOSE 355*  --  286* 405* 84  BUN 20  --  18 17 15   CREATININE 1.13  --  1.12 1.17 1.06  CALCIUM  10.1  --  9.8 9.8 9.8  MG  --  1.6*  --   --   --    GFR: Estimated Creatinine Clearance: 72.7 mL/min (by C-G formula based on SCr of 1.06 mg/dL). Liver Function  Tests: Recent Labs  Lab 06/17/24 1405  AST 23  ALT 32  ALKPHOS 79  BILITOT 0.4  PROT 6.9  ALBUMIN 4.0   No results for input(s): LIPASE, AMYLASE in the last 168 hours. No results for input(s): AMMONIA in the last 168  hours. Coagulation Profile: No results for input(s): INR, PROTIME in the last 168 hours. Cardiac Enzymes: No results for input(s): CKTOTAL, CKMB, CKMBINDEX, TROPONINI in the last 168 hours. BNP (last 3 results) No results for input(s): PROBNP in the last 8760 hours. HbA1C: Recent Labs    06/17/24 1405  HGBA1C 9.4*   CBG: Recent Labs  Lab 06/18/24 1945 06/18/24 2345 06/19/24 0342 06/19/24 0731 06/19/24 1128  GLUCAP 287* 186* 79 116* 282*   Lipid Profile: No results for input(s): CHOL, HDL, LDLCALC, TRIG, CHOLHDL, LDLDIRECT in the last 72 hours. Thyroid Function Tests: No results for input(s): TSH, T4TOTAL, FREET4, T3FREE, THYROIDAB in the last 72 hours. Anemia Panel: No results for input(s): VITAMINB12, FOLATE, FERRITIN, TIBC, IRON, RETICCTPCT in the last 72 hours. Most Recent Urinalysis On File:     Component Value Date/Time   COLORURINE YELLOW (A) 06/17/2024 1439   APPEARANCEUR CLOUDY (A) 06/17/2024 1439   LABSPEC 1.015 06/17/2024 1439   PHURINE 5.0 06/17/2024 1439   GLUCOSEU >=500 (A) 06/17/2024 1439   HGBUR LARGE (A) 06/17/2024 1439   BILIRUBINUR NEGATIVE 06/17/2024 1439   KETONESUR NEGATIVE 06/17/2024 1439   PROTEINUR 100 (A) 06/17/2024 1439   NITRITE POSITIVE (A) 06/17/2024 1439   LEUKOCYTESUR MODERATE (A) 06/17/2024 1439   Sepsis Labs: @LABRCNTIP (procalcitonin:4,lacticidven:4) Microbiology: Recent Results (from the past 240 hours)  Resp panel by RT-PCR (RSV, Flu A&B, Covid) Anterior Nasal Swab     Status: None   Collection Time: 06/17/24  2:05 PM   Specimen: Anterior Nasal Swab  Result Value Ref Range Status   SARS Coronavirus 2 by RT PCR NEGATIVE NEGATIVE Final    Comment: (NOTE) SARS-CoV-2 target nucleic acids are NOT DETECTED.  The SARS-CoV-2 RNA is generally detectable in upper respiratory specimens during the acute phase of infection. The lowest concentration of SARS-CoV-2 viral copies this assay can  detect is 138 copies/mL. A negative result does not preclude SARS-Cov-2 infection and should not be used as the sole basis for treatment or other patient management decisions. A negative result may occur with  improper specimen collection/handling, submission of specimen other than nasopharyngeal swab, presence of viral mutation(s) within the areas targeted by this assay, and inadequate number of viral copies(<138 copies/mL). A negative result must be combined with clinical observations, patient history, and epidemiological information. The expected result is Negative.  Fact Sheet for Patients:  bloggercourse.com  Fact Sheet for Healthcare Providers:  seriousbroker.it  This test is no t yet approved or cleared by the [place]  FDA and  has been authorized for detection and/or diagnosis of SARS-CoV-2 by FDA under an Emergency Use Authorization (EUA). This EUA will remain  in effect (meaning this test can be used) for the duration of the COVID-19 declaration under Section 564(b)(1) of the Act, 21 U.S.C.section 360bbb-3(b)(1), unless the authorization is terminated  or revoked sooner.       Influenza A by PCR NEGATIVE NEGATIVE Final   Influenza B by PCR NEGATIVE NEGATIVE Final    Comment: (NOTE) The Xpert Xpress SARS-CoV-2/FLU/RSV plus assay is intended as an aid in the diagnosis of influenza from Nasopharyngeal swab specimens and should not be used as a sole basis for treatment. Nasal washings and aspirates are unacceptable for  Xpert Xpress SARS-CoV-2/FLU/RSV testing.  Fact Sheet for Patients: bloggercourse.com  Fact Sheet for Healthcare Providers: seriousbroker.it  This test is not yet approved or cleared by the [place]  FDA and has been authorized for detection and/or diagnosis of SARS-CoV-2 by FDA under an Emergency Use Authorization (EUA). This EUA will remain in  effect (meaning this test can be used) for the duration of the COVID-19 declaration under Section 564(b)(1) of the Act, 21 U.S.C. section 360bbb-3(b)(1), unless the authorization is terminated or revoked.     Resp Syncytial Virus by PCR NEGATIVE NEGATIVE Final    Comment: (NOTE) Fact Sheet for Patients: bloggercourse.com  Fact Sheet for Healthcare Providers: seriousbroker.it  This test is not yet approved or cleared by the [place]  FDA and has been authorized for detection and/or diagnosis of SARS-CoV-2 by FDA under an Emergency Use Authorization (EUA). This EUA will remain in effect (meaning this test can be used) for the duration of the COVID-19 declaration under Section 564(b)(1) of the Act, 21 U.S.C. section 360bbb-3(b)(1), unless the authorization is terminated or revoked.  Performed at [facility], [address] [place], [place] [address]   Urine Culture     Status: Abnormal   Collection Time: 06/17/24  2:39 PM   Specimen: Urine, Catheterized  Result Value Ref Range Status   Specimen Description   Final    URINE, CATHETERIZED Performed at [facility], [address] [place], [place] [address]    Special Requests   Final    NONE Performed at [facility], [address] [place], [place] [address]    Culture >=100,000 COLONIES/mL ESCHERICHIA COLI (A)  Final   Report Status 06/19/2024 FINAL  Final   Organism ID, Bacteria ESCHERICHIA COLI (A)  Final      Susceptibility   Escherichia coli - MIC*    AMPICILLIN >=32 RESISTANT Resistant     CEFAZOLIN  (URINE) Value in next row Resistant      >=32 RESISTANTThis is a modified FDA-approved test that has been validated and its performance characteristics determined by the reporting laboratory.  This laboratory is certified under the Clinical Laboratory Improvement Amendments CLIA as qualified to perform high complexity clinical laboratory  testing.    CEFEPIME  Value in next row Sensitive      >=32 RESISTANTThis is a modified FDA-approved test that has been validated and its performance characteristics determined by the reporting laboratory.  This laboratory is certified under the Clinical Laboratory Improvement Amendments CLIA as qualified to perform high complexity clinical laboratory testing.    ERTAPENEM Value in next row Sensitive      >=32 RESISTANTThis is a modified FDA-approved test that has been validated and its performance characteristics determined by the reporting laboratory.  This laboratory is certified under the Clinical Laboratory Improvement Amendments CLIA as qualified to perform high complexity clinical laboratory testing.    CEFTRIAXONE  Value in next row Sensitive      >=32 RESISTANTThis is a modified FDA-approved test that has been validated and its performance characteristics determined by the reporting laboratory.  This laboratory is certified under the Clinical Laboratory Improvement Amendments CLIA as qualified to perform high complexity clinical laboratory testing.    CIPROFLOXACIN Value in next row Intermediate      >=32 RESISTANTThis is a modified FDA-approved test that has been validated and its performance characteristics determined by the reporting laboratory.  This laboratory is certified under the Clinical Laboratory Improvement Amendments CLIA as qualified to perform high complexity clinical laboratory testing.  GENTAMICIN Value in next row Sensitive      >=32 RESISTANTThis is a modified FDA-approved test that has been validated and its performance characteristics determined by the reporting laboratory.  This laboratory is certified under the Clinical Laboratory Improvement Amendments CLIA as qualified to perform high complexity clinical laboratory testing.    NITROFURANTOIN Value in next row Resistant      >=32 RESISTANTThis is a modified FDA-approved test that has been validated and its performance  characteristics determined by the reporting laboratory.  This laboratory is certified under the Clinical Laboratory Improvement Amendments CLIA as qualified to perform high complexity clinical laboratory testing.    TRIMETH/SULFA Value in next row Resistant      >=32 RESISTANTThis is a modified FDA-approved test that has been validated and its performance characteristics determined by the reporting laboratory.  This laboratory is certified under the Clinical Laboratory Improvement Amendments CLIA as qualified to perform high complexity clinical laboratory testing.    AMPICILLIN/SULBACTAM Value in next row Resistant      >=32 RESISTANTThis is a modified FDA-approved test that has been validated and its performance characteristics determined by the reporting laboratory.  This laboratory is certified under the Clinical Laboratory Improvement Amendments CLIA as qualified to perform high complexity clinical laboratory testing.    PIP/TAZO Value in next row Sensitive      8 SENSITIVEThis is a modified FDA-approved test that has been validated and its performance characteristics determined by the reporting laboratory.  This laboratory is certified under the Clinical Laboratory Improvement Amendments CLIA as qualified to perform high complexity clinical laboratory testing.    MEROPENEM Value in next row Sensitive      8 SENSITIVEThis is a modified FDA-approved test that has been validated and its performance characteristics determined by the reporting laboratory.  This laboratory is certified under the Clinical Laboratory Improvement Amendments CLIA as qualified to perform high complexity clinical laboratory testing.    * >=100,000 COLONIES/mL ESCHERICHIA COLI  C Difficile Quick Screen w PCR reflex     Status: None   Collection Time: 06/17/24  2:39 PM   Specimen: STOOL  Result Value Ref Range Status   C Diff antigen NEGATIVE NEGATIVE Final   C Diff toxin NEGATIVE NEGATIVE Final   C Diff interpretation No C.  difficile detected.  Final    Comment: Performed at [facility], [address] [place], [place] [address]  Gastrointestinal Panel by PCR , Stool     Status: None   Collection Time: 06/17/24  2:39 PM   Specimen: Stool  Result Value Ref Range Status   Campylobacter species NOT DETECTED NOT DETECTED Final   Plesimonas shigelloides NOT DETECTED NOT DETECTED Final   Salmonella species NOT DETECTED NOT DETECTED Final   Yersinia enterocolitica NOT DETECTED NOT DETECTED Final   Vibrio species NOT DETECTED NOT DETECTED Final   Vibrio cholerae NOT DETECTED NOT DETECTED Final   Enteroaggregative E coli (EAEC) NOT DETECTED NOT DETECTED Final   Enteropathogenic E coli (EPEC) NOT DETECTED NOT DETECTED Final   Enterotoxigenic E coli (ETEC) NOT DETECTED NOT DETECTED Final   Shiga like toxin producing E coli (STEC) NOT DETECTED NOT DETECTED Final   Shigella/Enteroinvasive E coli (EIEC) NOT DETECTED NOT DETECTED Final   Cryptosporidium NOT DETECTED NOT DETECTED Final   Cyclospora cayetanensis NOT DETECTED NOT DETECTED Final   Entamoeba histolytica NOT DETECTED NOT DETECTED Final   Giardia lamblia NOT DETECTED NOT DETECTED Final   Adenovirus F40/41 NOT DETECTED NOT DETECTED Final  Astrovirus NOT DETECTED NOT DETECTED Final   Norovirus GI/GII NOT DETECTED NOT DETECTED Final   Rotavirus A NOT DETECTED NOT DETECTED Final   Sapovirus (I, II, IV, and V) NOT DETECTED NOT DETECTED Final    Comment: Performed at [facility], [address], [place], [place] [address]      Radiology Studies last 3 days: [id number] [facility] Chest Port 1 View Result Date: 06/18/2024 CLINICAL DATA:  Aspiration into the airway. EXAM: PORTABLE CHEST 1 VIEW COMPARISON:  06/17/2024 FINDINGS: Patchy and linear density in the left lower lobe. The remainder of the lungs are clear with normal vascularity. Normal-sized heart. Aortic arch atheromatous calcifications. Unremarkable bones. IMPRESSION: Left lower lobe atelectasis and  possible aspiration pneumonitis. Electronically Signed   By: Elspeth Bathe M.D.   On: 06/18/2024 15:17   DG Chest Portable 1 View Result Date: 06/17/2024 CLINICAL DATA:  Weakness. EXAM: PORTABLE CHEST 1 VIEW COMPARISON:  01/06/2018. FINDINGS: The heart size and mediastinal contours are within normal limits. Low lung volumes with bronchovascular crowding. No focal consolidation, sizeable pleural effusion, or pneumothorax. No acute osseous abnormality. IMPRESSION: Low lung volumes.  No acute cardiopulmonary findings. Electronically Signed   By: Harrietta Sherry M.D.   On: 06/17/2024 13:56       Time spent: 50 min     Laneta Blunt, DO [facility] 06/19/2024, 3:10 PM    Dictation software may have been used to generate the above note. Typos may occur and escape review in typed/dictated notes. Please contact Dr Blunt directly for clarity if needed.  Staff may message me via secure chat in Epic  but this may not receive an immediate response,  please page me for urgent matters!  If 7PM-7AM, please contact night coverage [email or web address]       "

## 2024-06-19 NOTE — Plan of Care (Signed)

## 2024-06-20 DIAGNOSIS — Z87440 Personal history of urinary (tract) infections: Secondary | ICD-10-CM | POA: Diagnosis not present

## 2024-06-20 DIAGNOSIS — N390 Urinary tract infection, site not specified: Secondary | ICD-10-CM | POA: Diagnosis not present

## 2024-06-20 DIAGNOSIS — N400 Enlarged prostate without lower urinary tract symptoms: Secondary | ICD-10-CM

## 2024-06-20 LAB — BASIC METABOLIC PANEL WITH GFR
Anion gap: 11 (ref 5–15)
BUN: 12 mg/dL (ref 8–23)
CO2: 27 mmol/L (ref 22–32)
Calcium: 9.4 mg/dL (ref 8.9–10.3)
Chloride: 101 mmol/L (ref 98–111)
Creatinine, Ser: 1.08 mg/dL (ref 0.61–1.24)
GFR, Estimated: >60 mL/min
Glucose, Bld: 209 mg/dL — ABNORMAL HIGH (ref 70–99)
Potassium: 3.6 mmol/L (ref 3.5–5.1)
Sodium: 140 mmol/L (ref 135–145)

## 2024-06-20 LAB — CBC
HCT: 35.7 % — ABNORMAL LOW (ref 39.0–52.0)
Hemoglobin: 11.9 g/dL — ABNORMAL LOW (ref 13.0–17.0)
MCH: 31.6 pg (ref 26.0–34.0)
MCHC: 33.3 g/dL (ref 30.0–36.0)
MCV: 94.9 fL (ref 80.0–100.0)
Platelets: 143 K/uL — ABNORMAL LOW (ref 150–400)
RBC: 3.76 MIL/uL — ABNORMAL LOW (ref 4.22–5.81)
RDW: 11.4 % — ABNORMAL LOW (ref 11.5–15.5)
WBC: 6.9 K/uL (ref 4.0–10.5)
nRBC: 0 % (ref 0.0–0.2)

## 2024-06-20 LAB — GLUCOSE, CAPILLARY
Glucose-Capillary: 258 mg/dL — ABNORMAL HIGH (ref 70–99)
Glucose-Capillary: 241 mg/dL — ABNORMAL HIGH (ref 70–99)
Glucose-Capillary: 251 mg/dL — ABNORMAL HIGH (ref 70–99)
Glucose-Capillary: 157 mg/dL — ABNORMAL HIGH (ref 70–99)

## 2024-06-20 MED ORDER — INSULIN ASPART 100 UNIT/ML IJ SOLN
5.0000 [IU] | Freq: Three times a day (TID) | INTRAMUSCULAR | Status: DC
  Administered 2024-06-20 – 2024-06-25 (×18): 5 [IU] via SUBCUTANEOUS
  Filled 2024-06-20 (×18): qty 5

## 2024-06-20 MED ORDER — GLUCERNA SHAKE PO LIQD
237.0000 mL | Freq: Three times a day (TID) | ORAL | Status: DC
  Administered 2024-06-20 – 2024-06-27 (×16): 237 mL via ORAL

## 2024-06-20 MED ORDER — BISACODYL 10 MG RE SUPP
10.0000 mg | Freq: Every day | RECTAL | Status: DC | PRN
  Administered 2024-06-23: 10 mg via RECTAL
  Filled 2024-06-20: qty 1

## 2024-06-20 MED ORDER — SENNOSIDES-DOCUSATE SODIUM 8.6-50 MG PO TABS
2.0000 | ORAL_TABLET | Freq: Once | ORAL | Status: AC
  Administered 2024-06-20: 2 via ORAL
  Filled 2024-06-20: qty 2

## 2024-06-20 MED ORDER — ADULT MULTIVITAMIN W/MINERALS CH
1.0000 | ORAL_TABLET | Freq: Every day | ORAL | Status: DC
  Administered 2024-06-21 – 2024-06-27 (×7): 1 via ORAL
  Filled 2024-06-20 (×7): qty 1

## 2024-06-20 NOTE — Progress Notes (Unsigned)
" ° °  06/20/2024 9:31 AM   Troy Morse 04-13-1961 [id number]   HPI: 64 y.o. male here for initial evaluation of chronic urinary retention   Followed by [facility] [phone])   - recurrent urinary retention ~July 2025, Nov 2025  - hospitalized with DKA  - reportedly difficult catheter, although Urology placed Coude w/o issue    Hx of DM2 w/ neuropathy, toe amputation,   Previously seen by Dr. Twylla in 2021 for ED, on Trimix    PMH: Past Medical History:  Diagnosis Date   CRD (chronic renal disease)    Diabetes mellitus without complication (HCC)    GERD (gastroesophageal reflux disease)    Hypertension     Surgical History: Past Surgical History:  Procedure Laterality Date   BACK SURGERY     BONE EXCISION Right 03/21/2019   Procedure: PART EXCISION BONE-PHALANX RIGHT GREAT TOE;  Surgeon: Ashley Soulier, DPM;  Location: [facility] ORS;  Service: Podiatry;  Laterality: Right;   CHOLECYSTECTOMY     EYE SURGERY     torn retina   HERNIA REPAIR     INCISION AND DRAINAGE Right 12/13/2018   Procedure: INCISION AND DRAINAGE GREAT RIGHT TOE;  Surgeon: Ashley Soulier, DPM;  Location: [facility];  Service: Podiatry;  Laterality: Right;    Family History: Family History  Problem Relation Age of Onset   COPD Mother    Heart disease Father    COPD Father     Social History:  reports that he has never smoked. He has never used smokeless tobacco. He reports that he does not currently use alcohol. He reports that he does not currently use drugs.      Physical Exam: There were no vitals taken for this visit.   Constitutional:  Alert and oriented, No acute distress. Cardiovascular: No clubbing, cyanosis, or edema. Respiratory: Normal respiratory effort, no increased work of breathing. GI: Nondistended GU: *** Skin: No rashes, bruises or suspicious lesions. Neurologic: Grossly intact, no focal deficits, moving all 4 extremities. Psychiatric: Normal mood and affect.  Laboratory  Data: ***   Pertinent Imaging: I have personally viewed and interpreted the ***.    Assessment & Plan:    There are no diagnoses linked to this encounter.    Penne Skye, MD 06/20/2024  [facility] [address], [address] [place], [place] [address] [phone] "

## 2024-06-20 NOTE — Plan of Care (Signed)

## 2024-06-20 NOTE — Progress Notes (Signed)
 " PROGRESS NOTE    Troy Morse   FMW:[id number] DOB: Apr 10, 1961  DOA: 06/17/2024 Date of Service: 06/20/2024 which is hospital day 1  PCP: [name], MD    Hospital course / significant events:   HPI: Troy Morse is a 64 y.o. year old male with medical history of hypertension, hyperlipidemia, type 2 diabetes, history of CVA, history of PE on Eliquis  who presented to the ED with increasing weakness and somnolence.  Patient with poor functional status who has chronic foley catheter and later treatment for UTI.  His last catheter was placed 6 weeks ago. Per wife, he was supposed to have urology appointment to remove catheter but cancelled due to weakness. He denies any fevers or chills.   01/06: On arrival to the ED patient was noted to be HDS. CBC without leukocytosis, CMP with moderate hyperglycemia but.  Respiratory panel negative for COVID, flu, RSV.  UA consistent with urinary tract infection.  Urine culture pending.  Patient given 1 dose of ceftriaxone , appears last culture showed E. coli.  Admitted to hospitalist.  01/07: ongoing confusion/encephalopathic. UCx again (+)Ecoli, pending susceptibilities. Aspiration event this morning, question vomiting. NPO pending SLP eval, continue NS fluids. Foley exchanged today  01/08: much more alert / baseline. UCx (+)EColi multiple resistances, d/w ID pharmacist - plan for gentamicin dose and DC home however pt's wife requests he stays here for void trial, she would like to establish him w/ [facility] urologist instead of [facility]. D/w urology - will see pt, for now can continue w/ ceftriaxone  01/09: d/w urology, recommendation is for outpatient void trial but there is concern re transportation and mobility issues - d/w urology, will plan for void trial here Monday, will have to be Monday given limited availability of urology coverage through the weekend in case of adverse outcome. I attempted to call [name] to relay this plan this morning at 09:50 but  no answer, will attempt again later. PT/OT also to see, given concern for pt weaker than when he was DC from rehab at [facility] at which point he was ambulating.    Consultants:  Urology  Procedures/Surgeries: none      ASSESSMENT & PLAN:   Urinary Tract Infection UCX (+)Ecoli multiple resistances Altered mental status / metabolic encephalopathy d/t UTI - resolved  UTI POA associated w/ catheter which was also POA  IV Ceftriaxone , option for IV gentamicin x1 but since he will be in the hospital anyway will continue ceftriaxone  for now  Given pt's urinary retention, he has foley catheter. Plan was to follow up with urology on 06/16/24 for voiding trial but did not go d/t weakness. Foley exchanged here 06/18/2024 Of note, pt's wife over the phone was concerned about black mold in there she notes there was small black round balls coming out the urethra around the catheter and down the groin in the ED yesterday - no noted material that matches this description though there was noted to be significant sediment/debris.   Urinary retention Of note, pt's wife [name] has requested void trial here stating she does not want to take him home until he's urinating on his own.  Urology consult to discuss plan for void trial / other input  I have advised [name] that IF a void trial is attempted but he is unable to void independently then will need to re-place Foley and will plan release him from hospital to follow outpatient if he is medically stable. I have advised her also that either  way that I will defer to urology re: void trial given he is a complicated catheter placement. Urology consult - recommendation is for outpatient void trial but there is concern re transportation and mobility issues so will plan for void trial here Monday, will have to be Monday given limited availability of urology coverage through the weekend in case of adverse outcome. I attempted to call [name] to relay this  plan this morning at 09:50 but no answer, will attempt again later.  Continue finasteride     Type 2 diabetes w/ hyperglycemia  Uncontrolled diabetes may be leading to recurrent UTI. Needs good control of his diabetes, discussed extensively with wife and pt.  Eating better, resumed achs and basal insulin  Holding home metformin .   Continue home gabapentin  for neuropathy.   Aspiration event witnessed by RN 06/18/24 Potential aspiration pneumonitis but no symptoms Aspiration d/t encephalopathy, which has resolved Given CVA remains aspiration risk especially w/ other causes for weakness (currently, infection) Issue has resolved now that pt is more alert, he is tolerating diet  SLP eval --> regular diet IV fluids x1L for now   Essential Hypertension   Not on any pharmacotherapy  History of hypotension.   continue to monitor given active infection.   Holding home midodrine 2.5 mg 3 times daily.   Hyperlipidemia/CAD:  Continue home statin.   CKD 3A: At baseline.   Renally dose medications as needed, avoid nephrotoxic agents. Monitor BMP   GAD Continue home clonazepam , SSRI    GERD:  Continue home PPI   Hx of PE: on elquis,  continue home eliquis .    Debility: 2/2 to strokes since July.  Bed bound since stroke.  need continued PT/OT as he gets home PT/OT.  PT/OT consulted. Qualifies for rehab  Of note, he was ambulatory on discharge from [facility] rehab - w/ yes/no questions I ascertained that pt has not been getting up out of med much since being back home. Suspect worsening weakness d/t deconditioning + current UTI complicated of course by underlying CVA deficits  W/ yes/no questions, pt is amenable to rehab    Diarrhea: Appears to be chronic.  C. Diff negative.  GI panel also negative Monitor      Overweight based on BMI: Body mass index is 26.29 kg/m.SABRA Significantly low or high BMI is associated with higher medical risk.  Underweight - under 18  overweight - 25 to  29 obese - 30 or more Class 1 obesity: BMI of 30.0 to 34 Class 2 obesity: BMI of 35.0 to 39 Class 3 obesity: BMI of 40.0 to 49 Super Morbid Obesity: BMI 50-59 Super-super Morbid Obesity: BMI 60+ Healthy nutrition and physical activity advised as adjunct to other disease management and risk reduction treatments    DVT prophylaxis: eliquis  IV fluids: NS continuous IV fluids  x1L Nutrition: regular (S/p SLP eval) Central lines / other devices: Foley cath changed out today 06/18/2024   Code Status: DNR ACP documentation reviewed:  none on file in [facility]  [facility] needs: likely for SNF rehab placement  Barriers to dispo: at this point awaiting foley removal and void trial Monday (today is Friday) see above. At any rate he is likely to be here until SNF can be arranged.              Subjective / Brief ROS:  Pt alert Can indicate yes/no - answers YES to feeling okay, no problems right now, eating ok, pain is controlled, would want to go to rehab. Answers  NO to currently any pain    Family Communication: attempted call this monring to pt's wife, no answer, staff states she will be visiting soon, will d/w her then or call this afternoon    Objective Findings:  Vitals:   06/19/24 1601 06/19/24 1936 06/20/24 0453 06/20/24 0756  BP: 138/79 (!) 160/86 (!) 152/92 (!) 151/85  Pulse: 80 84 79 75  Resp: 17 18 18 16   Temp: 98.4 F (36.9 C) 98.2 F (36.8 C) 98.1 F (36.7 C)   TempSrc:  Oral    SpO2: 95% 96% 95% 97%  Weight:   86.6 kg   Height:        Intake/Output Summary (Last 24 hours) at 06/20/2024 1459 Last data filed at 06/20/2024 1300 Gross per 24 hour  Intake 700 ml  Output 900 ml  Net -200 ml   Filed Weights   06/18/24 0405 06/19/24 0500 06/20/24 0453  Weight: 83.1 kg 86.7 kg 86.6 kg    Examination:  Physical Exam Constitutional:      General: He is not in acute distress. Cardiovascular:     Rate and Rhythm: Normal rate and regular rhythm.  Pulmonary:      Effort: Pulmonary effort is normal.     Breath sounds: Normal breath sounds.  Musculoskeletal:     Right lower leg: No edema.     Left lower leg: No edema.  Neurological:     Mental Status: He is alert. Mental status is at baseline.     Comments: (Per pt's wife - not conversational at baseline but able to answer yes/no and more alert.) alert today and answering yes/no questions, following commands  Psychiatric:        Behavior: Behavior normal.          Scheduled Medications:   apixaban   5 mg Oral BID   aspirin  EC  81 mg Oral Daily   atorvastatin   40 mg Oral Daily   brimonidine   1 drop Both Eyes Q8H   calcium -vitamin D  1 tablet Oral Q breakfast   Chlorhexidine  Gluconate Cloth  6 each Topical Daily   clonazePAM   0.5 mg Oral BID   cyanocobalamin   1,000 mcg Oral Daily   feeding supplement (GLUCERNA SHAKE)  237 mL Oral TID BM   finasteride   5 mg Oral Daily   gabapentin   300 mg Oral BID   insulin  aspart  0-20 Units Subcutaneous TID WC   insulin  aspart  0-5 Units Subcutaneous QHS   insulin  aspart  5 Units Subcutaneous TID WC   insulin  glargine  30 Units Subcutaneous Daily   latanoprost   1 drop Both Eyes QHS   [START ON 06/21/2024] multivitamin with minerals  1 tablet Oral Daily   pantoprazole   40 mg Oral Daily   sertraline   150 mg Oral Daily   sodium chloride  flush  3 mL Intravenous Q12H    Continuous Infusions:  cefTRIAXone  (ROCEPHIN )  IV 2 g (06/20/24 1321)   promethazine  (PHENERGAN ) injection (IM or IVPB) Stopped (06/18/24 1056)    PRN Medications:  acetaminophen  **OR** acetaminophen , dextrose , labetalol , ondansetron  **OR** ondansetron  (ZOFRAN ) IV, promethazine  (PHENERGAN ) injection (IM or IVPB), senna-docusate  Antimicrobials from admission:  Anti-infectives (From admission, onward)    Start     Dose/Rate Route Frequency Ordered Stop   06/19/24 1400  cefTRIAXone  (ROCEPHIN ) 2 g in sodium chloride  0.9 % 100 mL IVPB        2 g 200 mL/hr over 30 Minutes Intravenous  Every 24 hours 06/19/24 0950  06/18/24 1300  cefTRIAXone  (ROCEPHIN ) 1 g in sodium chloride  0.9 % 100 mL IVPB  Status:  Discontinued        1 g 200 mL/hr over 30 Minutes Intravenous Every 24 hours 06/18/24 1154 06/19/24 0950   06/17/24 1515  cefTRIAXone  (ROCEPHIN ) 1 g in sodium chloride  0.9 % 100 mL IVPB        1 g 200 mL/hr over 30 Minutes Intravenous  Once 06/17/24 1508 06/17/24 1757           Data Reviewed:  I have personally reviewed the following...  CBC: Recent Labs  Lab 06/17/24 1405 06/18/24 0455 06/18/24 1529 06/19/24 0505 06/20/24 0545  WBC 10.1 9.2 8.6 7.8 6.9  HGB 13.0 12.9* 13.0 12.4* 11.9*  HCT 37.2* 37.4* 38.4* 36.3* 35.7*  MCV 93.7 93.3 94.6 94.0 94.9  PLT 156 153 145* 152 143*   Basic Metabolic Panel: Recent Labs  Lab 06/17/24 1405 06/17/24 1455 06/18/24 0455 06/18/24 1529 06/19/24 0505 06/20/24 0545  NA 137  --  135 135 141 140  K 4.1  --  4.1 4.5 3.5 3.6  CL 100  --  99 99 103 101  CO2 25  --  24 25 27 27   GLUCOSE 355*  --  286* 405* 84 209*  BUN 20  --  18 17 15 12   CREATININE 1.13  --  1.12 1.17 1.06 1.08  CALCIUM  10.1  --  9.8 9.8 9.8 9.4  MG  --  1.6*  --   --   --   --    GFR: Estimated Creatinine Clearance: 71.3 mL/min (by C-G formula based on SCr of 1.08 mg/dL). Liver Function Tests: Recent Labs  Lab 06/17/24 1405  AST 23  ALT 32  ALKPHOS 79  BILITOT 0.4  PROT 6.9  ALBUMIN 4.0   No results for input(s): LIPASE, AMYLASE in the last 168 hours. No results for input(s): AMMONIA in the last 168 hours. Coagulation Profile: No results for input(s): INR, PROTIME in the last 168 hours. Cardiac Enzymes: No results for input(s): CKTOTAL, CKMB, CKMBINDEX, TROPONINI in the last 168 hours. BNP (last 3 results) No results for input(s): PROBNP in the last 8760 hours. HbA1C: No results for input(s): HGBA1C in the last 72 hours.  CBG: Recent Labs  Lab 06/19/24 1128 06/19/24 1603 06/19/24 2121  06/20/24 0832 06/20/24 1145  GLUCAP 282* 264* 239* 258* 241*   Lipid Profile: No results for input(s): CHOL, HDL, LDLCALC, TRIG, CHOLHDL, LDLDIRECT in the last 72 hours. Thyroid Function Tests: No results for input(s): TSH, T4TOTAL, FREET4, T3FREE, THYROIDAB in the last 72 hours. Anemia Panel: No results for input(s): VITAMINB12, FOLATE, FERRITIN, TIBC, IRON, RETICCTPCT in the last 72 hours. Most Recent Urinalysis On File:     Component Value Date/Time   COLORURINE YELLOW (A) 06/17/2024 1439   APPEARANCEUR CLOUDY (A) 06/17/2024 1439   LABSPEC 1.015 06/17/2024 1439   PHURINE 5.0 06/17/2024 1439   GLUCOSEU >=500 (A) 06/17/2024 1439   HGBUR LARGE (A) 06/17/2024 1439   BILIRUBINUR NEGATIVE 06/17/2024 1439   KETONESUR NEGATIVE 06/17/2024 1439   PROTEINUR 100 (A) 06/17/2024 1439   NITRITE POSITIVE (A) 06/17/2024 1439   LEUKOCYTESUR MODERATE (A) 06/17/2024 1439   Sepsis Labs: @LABRCNTIP (procalcitonin:4,lacticidven:4) Microbiology: Recent Results (from the past 240 hours)  Resp panel by RT-PCR (RSV, Flu A&B, Covid) Anterior Nasal Swab     Status: None   Collection Time: 06/17/24  2:05 PM   Specimen: Anterior Nasal Swab  Result Value Ref Range  Status   SARS Coronavirus 2 by RT PCR NEGATIVE NEGATIVE Final    Comment: (NOTE) SARS-CoV-2 target nucleic acids are NOT DETECTED.  The SARS-CoV-2 RNA is generally detectable in upper respiratory specimens during the acute phase of infection. The lowest concentration of SARS-CoV-2 viral copies this assay can detect is 138 copies/mL. A negative result does not preclude SARS-Cov-2 infection and should not be used as the sole basis for treatment or other patient management decisions. A negative result may occur with  improper specimen collection/handling, submission of specimen other than nasopharyngeal swab, presence of viral mutation(s) within the areas targeted by this assay, and inadequate number of  viral copies(<138 copies/mL). A negative result must be combined with clinical observations, patient history, and epidemiological information. The expected result is Negative.  Fact Sheet for Patients:  bloggercourse.com  Fact Sheet for Healthcare Providers:  seriousbroker.it  This test is no t yet approved or cleared by the [place]  FDA and  has been authorized for detection and/or diagnosis of SARS-CoV-2 by FDA under an Emergency Use Authorization (EUA). This EUA will remain  in effect (meaning this test can be used) for the duration of the COVID-19 declaration under Section 564(b)(1) of the Act, 21 U.S.C.section 360bbb-3(b)(1), unless the authorization is terminated  or revoked sooner.       Influenza A by PCR NEGATIVE NEGATIVE Final   Influenza B by PCR NEGATIVE NEGATIVE Final    Comment: (NOTE) The Xpert Xpress SARS-CoV-2/FLU/RSV plus assay is intended as an aid in the diagnosis of influenza from Nasopharyngeal swab specimens and should not be used as a sole basis for treatment. Nasal washings and aspirates are unacceptable for Xpert Xpress SARS-CoV-2/FLU/RSV testing.  Fact Sheet for Patients: bloggercourse.com  Fact Sheet for Healthcare Providers: seriousbroker.it  This test is not yet approved or cleared by the [place]  FDA and has been authorized for detection and/or diagnosis of SARS-CoV-2 by FDA under an Emergency Use Authorization (EUA). This EUA will remain in effect (meaning this test can be used) for the duration of the COVID-19 declaration under Section 564(b)(1) of the Act, 21 U.S.C. section 360bbb-3(b)(1), unless the authorization is terminated or revoked.     Resp Syncytial Virus by PCR NEGATIVE NEGATIVE Final    Comment: (NOTE) Fact Sheet for Patients: bloggercourse.com  Fact Sheet for Healthcare  Providers: seriousbroker.it  This test is not yet approved or cleared by the [place]  FDA and has been authorized for detection and/or diagnosis of SARS-CoV-2 by FDA under an Emergency Use Authorization (EUA). This EUA will remain in effect (meaning this test can be used) for the duration of the COVID-19 declaration under Section 564(b)(1) of the Act, 21 U.S.C. section 360bbb-3(b)(1), unless the authorization is terminated or revoked.  Performed at [facility], [address] [place], [place] [address]   Urine Culture     Status: Abnormal   Collection Time: 06/17/24  2:39 PM   Specimen: Urine, Catheterized  Result Value Ref Range Status   Specimen Description   Final    URINE, CATHETERIZED Performed at [facility], [address], [place], [place] [address]    Special Requests   Final    NONE Performed at [facility], [address] [place], [place] [address]    Culture >=100,000 COLONIES/mL ESCHERICHIA COLI (A)  Final   Report Status 06/19/2024 FINAL  Final   Organism ID, Bacteria ESCHERICHIA COLI (A)  Final      Susceptibility   Escherichia coli - MIC*  AMPICILLIN >=32 RESISTANT Resistant     CEFAZOLIN  (URINE) Value in next row Resistant      >=32 RESISTANTThis is a modified FDA-approved test that has been validated and its performance characteristics determined by the reporting laboratory.  This laboratory is certified under the Clinical Laboratory Improvement Amendments CLIA as qualified to perform high complexity clinical laboratory testing.    CEFEPIME  Value in next row Sensitive      >=32 RESISTANTThis is a modified FDA-approved test that has been validated and its performance characteristics determined by the reporting laboratory.  This laboratory is certified under the Clinical Laboratory Improvement Amendments CLIA as qualified to perform high complexity clinical laboratory testing.    ERTAPENEM Value in  next row Sensitive      >=32 RESISTANTThis is a modified FDA-approved test that has been validated and its performance characteristics determined by the reporting laboratory.  This laboratory is certified under the Clinical Laboratory Improvement Amendments CLIA as qualified to perform high complexity clinical laboratory testing.    CEFTRIAXONE  Value in next row Sensitive      >=32 RESISTANTThis is a modified FDA-approved test that has been validated and its performance characteristics determined by the reporting laboratory.  This laboratory is certified under the Clinical Laboratory Improvement Amendments CLIA as qualified to perform high complexity clinical laboratory testing.    CIPROFLOXACIN Value in next row Intermediate      >=32 RESISTANTThis is a modified FDA-approved test that has been validated and its performance characteristics determined by the reporting laboratory.  This laboratory is certified under the Clinical Laboratory Improvement Amendments CLIA as qualified to perform high complexity clinical laboratory testing.    GENTAMICIN Value in next row Sensitive      >=32 RESISTANTThis is a modified FDA-approved test that has been validated and its performance characteristics determined by the reporting laboratory.  This laboratory is certified under the Clinical Laboratory Improvement Amendments CLIA as qualified to perform high complexity clinical laboratory testing.    NITROFURANTOIN Value in next row Resistant      >=32 RESISTANTThis is a modified FDA-approved test that has been validated and its performance characteristics determined by the reporting laboratory.  This laboratory is certified under the Clinical Laboratory Improvement Amendments CLIA as qualified to perform high complexity clinical laboratory testing.    TRIMETH/SULFA Value in next row Resistant      >=32 RESISTANTThis is a modified FDA-approved test that has been validated and its performance characteristics determined by  the reporting laboratory.  This laboratory is certified under the Clinical Laboratory Improvement Amendments CLIA as qualified to perform high complexity clinical laboratory testing.    AMPICILLIN/SULBACTAM Value in next row Resistant      >=32 RESISTANTThis is a modified FDA-approved test that has been validated and its performance characteristics determined by the reporting laboratory.  This laboratory is certified under the Clinical Laboratory Improvement Amendments CLIA as qualified to perform high complexity clinical laboratory testing.    PIP/TAZO Value in next row Sensitive      8 SENSITIVEThis is a modified FDA-approved test that has been validated and its performance characteristics determined by the reporting laboratory.  This laboratory is certified under the Clinical Laboratory Improvement Amendments CLIA as qualified to perform high complexity clinical laboratory testing.    MEROPENEM Value in next row Sensitive      8 SENSITIVEThis is a modified FDA-approved test that has been validated and its performance characteristics determined by the reporting laboratory.  This laboratory is certified under the Clinical  Laboratory Improvement Amendments CLIA as qualified to perform high complexity clinical laboratory testing.    * >=100,000 COLONIES/mL ESCHERICHIA COLI  C Difficile Quick Screen w PCR reflex     Status: None   Collection Time: 06/17/24  2:39 PM   Specimen: STOOL  Result Value Ref Range Status   C Diff antigen NEGATIVE NEGATIVE Final   C Diff toxin NEGATIVE NEGATIVE Final   C Diff interpretation No C. difficile detected.  Final    Comment: Performed at [facility], [address] [place], [place] [address]  Gastrointestinal Panel by PCR , Stool     Status: None   Collection Time: 06/17/24  2:39 PM   Specimen: Stool  Result Value Ref Range Status   Campylobacter species NOT DETECTED NOT DETECTED Final   Plesimonas shigelloides NOT DETECTED NOT DETECTED Final    Salmonella species NOT DETECTED NOT DETECTED Final   Yersinia enterocolitica NOT DETECTED NOT DETECTED Final   Vibrio species NOT DETECTED NOT DETECTED Final   Vibrio cholerae NOT DETECTED NOT DETECTED Final   Enteroaggregative E coli (EAEC) NOT DETECTED NOT DETECTED Final   Enteropathogenic E coli (EPEC) NOT DETECTED NOT DETECTED Final   Enterotoxigenic E coli (ETEC) NOT DETECTED NOT DETECTED Final   Shiga like toxin producing E coli (STEC) NOT DETECTED NOT DETECTED Final   Shigella/Enteroinvasive E coli (EIEC) NOT DETECTED NOT DETECTED Final   Cryptosporidium NOT DETECTED NOT DETECTED Final   Cyclospora cayetanensis NOT DETECTED NOT DETECTED Final   Entamoeba histolytica NOT DETECTED NOT DETECTED Final   Giardia lamblia NOT DETECTED NOT DETECTED Final   Adenovirus F40/41 NOT DETECTED NOT DETECTED Final   Astrovirus NOT DETECTED NOT DETECTED Final   Norovirus GI/GII NOT DETECTED NOT DETECTED Final   Rotavirus A NOT DETECTED NOT DETECTED Final   Sapovirus (I, II, IV, and V) NOT DETECTED NOT DETECTED Final    Comment: Performed at [facility], [address], [place], [place] [address]      Radiology Studies last 3 days: [facility] Chest Port 1 View Result Date: 06/18/2024 CLINICAL DATA:  Aspiration into the airway. EXAM: PORTABLE CHEST 1 VIEW COMPARISON:  06/17/2024 FINDINGS: Patchy and linear density in the left lower lobe. The remainder of the lungs are clear with normal vascularity. Normal-sized heart. Aortic arch atheromatous calcifications. Unremarkable bones. IMPRESSION: Left lower lobe atelectasis and possible aspiration pneumonitis. Electronically Signed   By: Elspeth Bathe M.D.   On: 06/18/2024 15:17   DG Chest Portable 1 View Result Date: 06/17/2024 CLINICAL DATA:  Weakness. EXAM: PORTABLE CHEST 1 VIEW COMPARISON:  01/06/2018. FINDINGS: The heart size and mediastinal contours are within normal limits. Low lung volumes with bronchovascular crowding. No focal consolidation,  sizeable pleural effusion, or pneumothorax. No acute osseous abnormality. IMPRESSION: Low lung volumes.  No acute cardiopulmonary findings. Electronically Signed   By: Harrietta Sherry M.D.   On: 06/17/2024 13:56       Time spent: 50 min     Laneta Blunt, DO [facility] 06/20/2024, 2:59 PM    Dictation software may have been used to generate the above note. Typos may occur and escape review in typed/dictated notes. Please contact Dr Blunt directly for clarity if needed.  Staff may message me via secure chat in Epic  but this may not receive an immediate response,  please page me for urgent matters!  If 7PM-7AM, please contact night coverage [email or web address]       "

## 2024-06-20 NOTE — TOC Progression Note (Signed)
 Transition of Care [facility]) - Progression Note    Patient Details  Name: Troy Morse MRN: [id number] Date of Birth: 02-09-1961  [facility]) CM/SW Contact  Daved JONETTA Hamilton, RN Phone Number: 06/20/2024, 4:35 PM  Clinical Narrative:     Met with patient and patient's spouse [name] at bedside, Attending and Unit Director also present. Attending discussed with patient and spouse the plan of care regarding voiding trial timing and weekend hospital stay. Also discussed therapy updated recommendations for SNF-STR, patient and spouse verbalized agreement to this, TOC will begin SNF workup, unlikely will secure placement until next week, anticipate Tuesday 06/24/24.   PASRR Obtained- [id number] A FL2 sent for signature Referrals sent in HUB  Expected Discharge Plan: Home w Home Health Services Barriers to Discharge: Continued Medical Work up               Expected Discharge Plan and Services   Discharge Planning Services: CM Consult Post Acute Care Choice: Durable Medical Equipment, Home Health Living arrangements for the past 2 months: Single Family Home                 DME Arranged: Hospital bed, Other see comment Lajean Lift) DME Agency: [employer]                   Social Drivers of Health (SDOH) Interventions SDOH Screenings   Food Insecurity: No Food Insecurity (06/17/2024)  Housing: Low Risk (06/17/2024)  Transportation Needs: No Transportation Needs (06/17/2024)  Utilities: Not At Risk (06/17/2024)  Financial Resource Strain: Low Risk  (04/29/2024)   Received from [facility]  Tobacco Use: Low Risk (06/17/2024)  Health Literacy: Adequate Health Literacy (05/15/2024)   Received from [facility]    Readmission Risk Interventions     No data to display

## 2024-06-20 NOTE — Plan of Care (Signed)

## 2024-06-20 NOTE — Progress Notes (Signed)
 Occupational Therapy Treatment Patient Details Name: Troy Morse MRN: [id number] DOB: [birth year] Today's Date: 06/20/2024   History of present illness Pt is a 64 y/o M admitted on 06/17/24 after presenting with c/o increasing weakness & somnolence. Pt is being treated for complicated UTI. PMH: HTN, HLD, DM2, CVA, PE, chronic renal disease   OT comments  Pt seen for OT/PT co-tx to maximize therapeutic outcomes. Pt continues to require +2 MAX A for bed mobility, and requires MIN - MOD A to maintain supported static sitting. Session focused on improving orientation to midline, functional reach with BUE and dynamic sitting. Pt presents with R inattention / neglect, aphasia, and volitional movement only in LUE. Able to activate RUE with significant multimodal cuing and incorporation of vision for tricep vs bicep control, participates in postural control with trunk + cervical extension as pt presents with kyphotic posture. Discharge recommendation update, OT will continue to follow.       If plan is discharge home, recommend the following:  Two people to help with walking and/or transfers;Two people to help with bathing/dressing/bathroom;Assist for transportation   Equipment Recommendations  Hospital bed;Hoyer lift       Precautions / Restrictions Precautions Precautions: Fall Recall of Precautions/Restrictions: Impaired Restrictions Weight Bearing Restrictions Per Provider Order: No       Mobility Bed Mobility Overal bed mobility: Needs Assistance Bed Mobility: Supine to Sit, Sit to Supine     Supine to sit: Max assist, +2 for physical assistance, +2 for safety/equipment, Used rails, HOB elevated Sit to supine: Max assist, +2 for physical assistance, +2 for safety/equipment        Transfers                   General transfer comment: Did not attempt transfers. Session focused on dynamic vs static seated balance, midline orientation and proximal postural control.      Balance Overall balance assessment: Needs assistance Sitting-balance support: Feet supported Sitting balance-Leahy Scale: Poor Sitting balance - Comments: posterior and R lateral lean. requires MIN A for all static sitting balance, increases to MOD with dynamic Postural control: Right lateral lean, Posterior lean     Standing balance comment: unable to safely attempt standing                           ADL either performed or assessed with clinical judgement   ADL Overall ADL's : Needs assistance/impaired                 Upper Body Dressing : Maximal assistance;Bed level Upper Body Dressing Details (indicate cue type and reason): doff and don gown bed level. pt is able to use RUE to reach for L sleeve, difficulties performing GMC/FMC to undo buttons. Unable to [employer] functionally throughout session.                   General ADL Comments: MAX A to doff/don gown bed level.     Communication Communication Communication: Impaired Factors Affecting Communication: Difficulty expressing self;Reduced clarity of speech (aphasia)   Cognition Arousal: Alert Behavior During Therapy: Flat affect, WFL for tasks assessed/performed Cognition: Difficult to assess Difficult to assess due to: Impaired communication           OT - Cognition Comments: aphasia                 Following commands: Intact Following commands impaired: Follows one step commands with increased time  Cueing   Cueing Techniques: Verbal cues, Tactile cues, Visual cues  Exercises Exercises: Other exercises Other Exercises Other Exercises: Pt sat at EOB for most of session focusing on midline orientation, dynamic reaching tasks, neuromuscular activation of BUE with target reach, cervical and postural correction            Pertinent Vitals/ Pain       Pain Assessment Pain Assessment: No/denies pain Faces Pain Scale: No hurt         Frequency  Min 2X/week         Progress Toward Goals  OT Goals(current goals can now be found in the care plan section)  Progress towards OT goals: Progressing toward goals  Acute Rehab OT Goals OT Goal Formulation: With patient/family Time For Goal Achievement: 07/02/24 Potential to Achieve Goals: Fair  Plan      Co-evaluation    PT/OT/SLP Co-Evaluation/Treatment: Yes Reason for Co-Treatment: To address functional/ADL transfers;Complexity of the patient's impairments (multi-system involvement);For patient/therapist safety PT goals addressed during session: Mobility/safety with mobility;Balance;Proper use of DME;Strengthening/ROM OT goals addressed during session: ADL's and self-care      AM-PAC OT 6 Clicks Daily Activity     Outcome Measure   Help from another person eating meals?: A Lot Help from another person taking care of personal grooming?: A Lot Help from another person toileting, which includes using toliet, bedpan, or urinal?: Total Help from another person bathing (including washing, rinsing, drying)?: Total Help from another person to put on and taking off regular upper body clothing?: Total Help from another person to put on and taking off regular lower body clothing?: Total 6 Click Score: 8    End of Session    OT Visit Diagnosis: Other abnormalities of gait and mobility (R26.89)   Activity Tolerance Patient tolerated treatment well   Patient Left in bed;with call bell/phone within reach;with bed alarm set   Nurse Communication Mobility status        Time: 8974-8941 OT Time Calculation (min): 33 min  Charges: OT General Charges $OT Visit: 1 Visit OT Treatments $Neuromuscular Re-education: 8-22 mins  Makaylyn Sinyard L. Zakarie Sturdivant, OTR/L  06/20/2024, 3:43 PM

## 2024-06-20 NOTE — TOC Progression Note (Signed)
 Transition of Care [facility]) - Progression Note    Patient Details  Name: Troy Morse MRN: [id number] Date of Birth: 11/10/1960  [facility]) CM/SW Contact  Daved JONETTA Hamilton, RN Phone Number: 06/20/2024, 10:45 AM  Clinical Narrative:     Received call from patient's Spouse (POA) [name], she verbalized many concerns regarding patient's plan of care and discharge. [name] verbalized that she wants the void trial to be performed in the hospital, she stated she does not feel that her concerns about the patient and the difficulties she has transporting him are being heard. [name] verbalized she is 64 years old and unable to continue struggling to man handle him from his bed to the car and into facilities and back to the car and home. She stated he is bed bound and they do as much telehealth as possible because in person is so difficult. [name] verbalized she does not agree that she has to take him home with no catheter and if he doesn't void at home within 6 hours of catheter removal, she has to go thru the above process to bring him back to the clinic/hospital. [name] also verbalized she doesn't understand that he is only getting one dose of IV antibiotics and states further she doesn't even really know what his plan of care is at this time. [name] advised she should be at the hospital in a couple of hours. Discussed with [name] that I will seek out guidance from my team and also speak with the patient's Attending, and we will follow up with her, she verbalized understanding and agreement to this plan.   Attending notified of above conversation.    Expected Discharge Plan: Home w Home Health Services Barriers to Discharge: Continued Medical Work up               Expected Discharge Plan and Services   Discharge Planning Services: CM Consult Post Acute Care Choice: Durable Medical Equipment, Home Health Living arrangements for the past 2 months: Single Family  Home                 DME Arranged: Hospital bed, Other see comment Lajean Lift) DME Agency: [employer]                   Social Drivers of Health (SDOH) Interventions SDOH Screenings   Food Insecurity: No Food Insecurity (06/17/2024)  Housing: Low Risk (06/17/2024)  Transportation Needs: No Transportation Needs (06/17/2024)  Utilities: Not At Risk (06/17/2024)  Financial Resource Strain: Low Risk  (04/29/2024)   Received [facility]  Tobacco Use: Low Risk (06/17/2024)  Health Literacy: Adequate Health Literacy (05/15/2024)   Received from [facility]    Readmission Risk Interventions     No data to display

## 2024-06-20 NOTE — Progress Notes (Signed)
 Initial Nutrition Assessment  DOCUMENTATION CODES:   Not applicable  INTERVENTION:   -Change to carb modified diet due to persistent hyperglycemia and improved oral intake -Glucerna Shake po TID, each supplement provides 220 kcal and 10 grams of protein  -MVI with minerals daily -Feeding assistance with meals  -Magic cup TID with meals, each supplement provides 290 kcal and 9 grams of protein   NUTRITION DIAGNOSIS:   Increased nutrient needs related to acute illness as evidenced by estimated needs.  GOAL:   Patient will meet greater than or equal to 90% of their needs  MONITOR:   PO intake, Supplement acceptance  REASON FOR ASSESSMENT:   Consult Assessment of nutrition requirement/status, Calorie Count, Poor PO  ASSESSMENT:   64 y.o. year old male with medical history of hypertension, hyperlipidemia, type 2 diabetes, history of CVA, history of PE on Eliquis  who presented with increasing weakness and somnolence.  Patient with poor functional status who has chronic foley catheter and later treatment for UTI.  His last catheter was placed 6 weeks ago. Per wife, he was supposed to have urology appointment to remove catheter but cancelled due to weakness. He denies any fevers or chills.  Patient admitted with complicated UTI.   1/7- s/p BSE- regular diet with thin liquids   Patient lying in bed at time of visit. No family at bedside. Patient did not arouse to voice or touch.   Patient currently on a regular diet. Noted meal completions 50-100%.   Reviewed weight history. Per CareEverywhere, patient was 186# on 06/15/23. No weight loss noted over the past year. Per [facility] RD notes from prior hospitalizations, patient with history of severe malnutrition, which has resolved.   Calorie count completed from 1/7-06/20/24:   1/7-1/8 Breakfast: 673 kcals, 16 grams protein Lunch: 0 kcals, 0 grams protein Dinner: 240 kcals, 13 grams protein  Total intake: 913 kcal (47% of minimum  estimated needs)  29 grams protein (29% of minimum estimated needs)  1/8-1/9 Breakfast: 673 kcals, 16 grams protein Lunch: 725 kcals, 16 grams protein Dinner: 234 kcals, 13 grams protein  Total intake: 1632 kcal (84% of minimum estimated needs)  45 grams protein (45% of minimum estimated needs)  Average Total intake: 1273 kcal (65% of minimum estimated needs)  37 grams protein (37% of minimum estimated needs)  Medications reviewed and include calcium  with vitamin D, klonopin , vitamin B-12, and neurontin .  Lab Results  Component Value Date   HGBA1C 9.4 (H) 06/17/2024   PTA DM medications are 22 units insulin  glargine daily, 25 mg jardiance  daily, and 88-98 units tresiba daily. Per DM coordinator notes, wife does not want to follow endocrinology and prefers PCP manages DM. He uses a [employer] 3 CGM PTA.   Labs reviewed: CBGS: 79-258 (inpatient orders for glycemic control are 0-20 units novolog  TID, 0-5 units insulin  aspart daily at bedtime, 5 units insulin  aspart TID with meals, and 30 units insulin  glargine daily).    NUTRITION - FOCUSED PHYSICAL EXAM:  Flowsheet Row Most Recent Value  Orbital Region No depletion  Upper Arm Region No depletion  Thoracic and Lumbar Region No depletion  Buccal Region No depletion  Temple Region No depletion  Clavicle Bone Region No depletion  Clavicle and Acromion Bone Region No depletion  Scapular Bone Region No depletion  Dorsal Hand No depletion  Patellar Region No depletion  Anterior Thigh Region No depletion  Posterior Calf Region No depletion  Edema (RD Assessment) Mild  Hair Reviewed  Eyes Reviewed  Mouth Reviewed  Skin Reviewed  Nails Reviewed    Diet Order:   Diet Order             Diet regular Room service appropriate? Yes with Assist; Fluid consistency: Thin  Diet effective now                   EDUCATION NEEDS:   Not appropriate for education at this time  Skin:  Skin Assessment: Reviewed RN  Assessment  Last BM:  06/17/24  Height:   Ht Readings from Last 1 Encounters:  06/17/24 5' 10 (1.778 m)    Weight:   Wt Readings from Last 1 Encounters:  06/20/24 86.6 kg    Ideal Body Weight:  75.5 kg  BMI:  Body mass index is 27.39 kg/m.  Estimated Nutritional Needs:   Kcal:  1950-2150  Protein:  100-115 grams  Fluid:  1.9-2.1 L    Margery ORN, RD, LDN, CDCES Registered Dietitian III Certified Diabetes Care and Education Specialist If unable to reach this RD, please use RD Inpatient group chat on secure chat between hours of 8am-4 pm daily

## 2024-06-20 NOTE — NC FL2 (Signed)
 " [place]  MEDICAID FL2 LEVEL OF CARE FORM     IDENTIFICATION  Patient Name: Troy Morse Birthdate: 1960-12-31 Sex: male Admission Date (Current Location): 06/17/2024  [facility] and [place] Number:  Chiropodist and Address:  [facility], [address] [place], [place] [address]      Provider Number: [id number]  Attending Physician Name and Address:  Marsa Edelman, DO  Relative Name and Phone Number:  [name] [phone]    Current Level of Care: Hospital Recommended Level of Care: Skilled Nursing Facility Prior Approval Number:    Date Approved/Denied:   PASRR Number: [id number]  Discharge Plan: SNF    Current Diagnoses: Patient Active Problem List   Diagnosis Date Noted   Complicated UTI (urinary tract infection) 06/17/2024   H/O: stroke 06/17/2024   Obesity (BMI 30-39.9) 04/28/2023   Type 2 diabetes mellitus with peripheral neuropathy (HCC) 04/27/2023   Dyslipidemia 04/27/2023   Essential hypertension 04/27/2023   Acute kidney injury superimposed on chronic kidney disease 04/27/2023   Acute CVA (cerebrovascular accident) (HCC) 04/26/2023   Acute osteomyelitis of toe of right foot (HCC) 12/11/2018   Age-related nuclear cataract of both eyes 07/18/2017   Atrophic gastritis without hemorrhage 11/23/2015   Acute pancreatitis 10/03/2015   CKD (chronic kidney disease) stage 3, GFR 30-59 ml/min (HCC) 05/30/2012   Abnormal stress echocardiogram 05/07/2012   CAD (coronary artery disease) 04/26/2012   Cataract 08/23/2011   Adjustment reaction 03/03/2011    Orientation RESPIRATION BLADDER Height & Weight     Self  Normal Indwelling catheter (planning for Void trial on Monday 1/12, if fails catheter will be replaced) Weight: 86.6 kg Height:  5' 10 (177.8 cm)  BEHAVIORAL SYMPTOMS/MOOD NEUROLOGICAL BOWEL NUTRITION STATUS      Continent Diet (Regular room service, requires assistance with all meals)  AMBULATORY STATUS  COMMUNICATION OF NEEDS Skin   Total Care Verbally (verbally and non verbally) Other (Comment) (Redness to sacrum- Foam dressing in place)                       Personal Care Assistance Level of Assistance  Bathing, Feeding, Dressing, Total care Bathing Assistance: Maximum assistance Feeding assistance: Maximum assistance Dressing Assistance: Maximum assistance Total Care Assistance: Maximum assistance   Functional Limitations Info  Speech     Speech Info: Impaired (difficulty speaking)    SPECIAL CARE FACTORS FREQUENCY  PT (By licensed PT), OT (By licensed OT)     PT Frequency: 5x week OT Frequency: 5x week            Contractures Contractures Info: Not present    Additional Factors Info  Code Status, Allergies Code Status Info: DNR- DNI Allergies Info: No Active Allergies           Current Medications (06/20/2024):  This is the current hospital active medication list Current Facility-Administered Medications  Medication Dose Route Frequency Provider Last Rate Last Admin   acetaminophen  (TYLENOL ) tablet 650 mg  650 mg Oral Q6H PRN Khan, Ghalib, MD   650 mg at 06/20/24 1515   Or   acetaminophen  (TYLENOL ) suppository 650 mg  650 mg Rectal Q6H PRN Khan, Ghalib, MD       apixaban  (ELIQUIS ) tablet 5 mg  5 mg Oral BID Khan, Ghalib, MD   5 mg at 06/20/24 0848   aspirin  EC tablet 81 mg  81 mg Oral Daily Fernand Prost, MD   81 mg at 06/20/24 0847   atorvastatin  (LIPITOR)  tablet 40 mg  40 mg Oral Daily Khan, Ghalib, MD   40 mg at 06/19/24 2127   brimonidine  (ALPHAGAN ) 0.2 % ophthalmic solution 1 drop  1 drop Both Eyes Q8H Alexander, Natalie, DO   1 drop at 06/20/24 1317   calcium -vitamin D (OSCAL WITH D) 500-5 MG-MCG per tablet 1 tablet  1 tablet Oral Q breakfast Fernand Prost, MD   1 tablet at 06/20/24 0848   cefTRIAXone  (ROCEPHIN ) 2 g in sodium chloride  0.9 % 100 mL IVPB  2 g Intravenous Q24H Zeigler, Dustin G, RPH 200 mL/hr at 06/20/24 1321 2 g at 06/20/24 1321    Chlorhexidine  Gluconate Cloth 2 % PADS 6 each  6 each Topical Daily Alexander, Natalie, DO   6 each at 06/20/24 0848   clonazePAM  (KLONOPIN ) tablet 0.5 mg  0.5 mg Oral BID Alexander, Natalie, DO   0.5 mg at 06/20/24 9152   cyanocobalamin  (VITAMIN B12) tablet 1,000 mcg  1,000 mcg Oral Daily Khan, Ghalib, MD   1,000 mcg at 06/20/24 0847   dextrose  50 % solution 50 mL  1 ampule Intravenous PRN Marsa Edelman, DO       feeding supplement (GLUCERNA SHAKE) (GLUCERNA SHAKE) liquid 237 mL  237 mL Oral TID BM Marsa Edelman, DO   237 mL at 06/20/24 1317   finasteride  (PROSCAR ) tablet 5 mg  5 mg Oral Daily Khan, Ghalib, MD   5 mg at 06/20/24 0848   gabapentin  (NEURONTIN ) capsule 300 mg  300 mg Oral BID Alexander, Natalie, DO   300 mg at 06/20/24 0848   insulin  aspart (novoLOG ) injection 0-20 Units  0-20 Units Subcutaneous TID WC Alexander, Natalie, DO   7 Units at 06/20/24 1316   insulin  aspart (novoLOG ) injection 0-5 Units  0-5 Units Subcutaneous QHS Alexander, Natalie, DO   2 Units at 06/19/24 2127   insulin  aspart (novoLOG ) injection 5 Units  5 Units Subcutaneous TID WC Alexander, Natalie, DO   5 Units at 06/20/24 1316   insulin  glargine (LANTUS ) injection 30 Units  30 Units Subcutaneous Daily Alexander, Natalie, DO   30 Units at 06/19/24 2127   labetalol  (NORMODYNE ) injection 10 mg  10 mg Intravenous Q2H PRN Fernand Prost, MD       latanoprost  (XALATAN ) 0.005 % ophthalmic solution 1 drop  1 drop Both Eyes QHS Fernand Prost, MD   1 drop at 06/19/24 2128   [START ON 06/21/2024] multivitamin with minerals tablet 1 tablet  1 tablet Oral Daily Alexander, Natalie, DO       ondansetron  (ZOFRAN ) tablet 4 mg  4 mg Oral Q6H PRN Fernand Prost, MD       Or   ondansetron  (ZOFRAN ) injection 4 mg  4 mg Intravenous Q6H PRN Khan, Ghalib, MD   4 mg at 06/18/24 9092   pantoprazole  (PROTONIX ) EC tablet 40 mg  40 mg Oral Daily Khan, Ghalib, MD   40 mg at 06/20/24 0848   promethazine  (PHENERGAN ) 12.5 mg in sodium chloride   0.9 % 50 mL IVPB  12.5 mg Intravenous Q6H PRN Alexander, Natalie, DO   Stopped at 06/18/24 1056   senna-docusate (Senokot-S) tablet 1 tablet  1 tablet Oral QHS PRN Khan, Ghalib, MD   1 tablet at 06/17/24 2133   sertraline  (ZOLOFT ) tablet 150 mg  150 mg Oral Daily Khan, Ghalib, MD   150 mg at 06/20/24 0847   sodium chloride  flush (NS) 0.9 % injection 3 mL  3 mL Intravenous Q12H Khan, Ghalib, MD   3 mL at 06/20/24  9151     Discharge Medications: Please see discharge summary for a list of discharge medications.  Relevant Imaging Results:  Relevant Lab Results:   Additional Information SS# [id number]  Daved JONETTA Hamilton, RN     "

## 2024-06-20 NOTE — Consult Note (Signed)
 "   Urology Consult  I have been asked to see the patient by Dr. Marsa, for evaluation and management of urinary retention.  Chief Complaint: Weakness  History of Present Illness: Troy Morse is a 64 y.o. year old male with PMH multiple CVA, DM2, PE on Eliquis , chronic urinary retention who has failed multiple voiding trials, possible BPH, and recurrent UTI admitted on 06/17/2024 for management of complicated UTI.  Per chart review, he has had chronic Foley dependent urinary retention with possible difficult Foley placement since at least November 2025, previously seen via telehealth by [facility] urology.  He has had recurrent UTIs since Foley insertion.  He failed Flomax due to hypotension.  He was originally scheduled for outpatient voiding trial with [facility] on the day of his admission at [facility], however he was brought to our hospital due to weakness and missed that appointment.  His wife has been adamant about an inpatient voiding trial due to concerns that she will not be able to physically bring him to clinic in light of his mobility limitations.  Foley catheter in place draining clear, yellow urine today.  Patient states his catheter has been removed by nursing.  Admission urine culture grew MDR E. coli.  He is on antibiotics as below.  Anti-infectives (From admission, onward)    Start     Dose/Rate Route Frequency Ordered Stop   06/19/24 1400  cefTRIAXone  (ROCEPHIN ) 2 g in sodium chloride  0.9 % 100 mL IVPB        2 g 200 mL/hr over 30 Minutes Intravenous Every 24 hours 06/19/24 0950     06/18/24 1300  cefTRIAXone  (ROCEPHIN ) 1 g in sodium chloride  0.9 % 100 mL IVPB  Status:  Discontinued        1 g 200 mL/hr over 30 Minutes Intravenous Every 24 hours 06/18/24 1154 06/19/24 0950   06/17/24 1515  cefTRIAXone  (ROCEPHIN ) 1 g in sodium chloride  0.9 % 100 mL IVPB        1 g 200 mL/hr over 30 Minutes Intravenous  Once 06/17/24 1508 06/17/24 1757        Past Medical History:  Diagnosis  Date   CRD (chronic renal disease)    Diabetes mellitus without complication (HCC)    GERD (gastroesophageal reflux disease)    Hypertension     Past Surgical History:  Procedure Laterality Date   BACK SURGERY     BONE EXCISION Right 03/21/2019   Procedure: PART EXCISION BONE-PHALANX RIGHT GREAT TOE;  Surgeon: Ashley Soulier, DPM;  Location: [facility] ORS;  Service: Podiatry;  Laterality: Right;   CHOLECYSTECTOMY     EYE SURGERY     torn retina   HERNIA REPAIR     INCISION AND DRAINAGE Right 12/13/2018   Procedure: INCISION AND DRAINAGE GREAT RIGHT TOE;  Surgeon: Ashley Soulier, DPM;  Location: [facility] ORS;  Service: Podiatry;  Laterality: Right;    Home Medications:  Active Medications[1]  Allergies: Allergies[2]  Family History  Problem Relation Age of Onset   COPD Mother    Heart disease Father    COPD Father     Social History:  reports that he has never smoked. He has never used smokeless tobacco. He reports that he does not currently use alcohol. He reports that he does not currently use drugs.  ROS: A complete review of systems was performed.  All systems are negative except for pertinent findings as noted.  Physical Exam:  Vital signs in last 24 hours: Temp:  [98.1 F (  36.7 C)-98.4 F (36.9 C)] 98.1 F (36.7 C) (01/09 0453) Pulse Rate:  [75-84] 75 (01/09 0756) Resp:  [16-18] 16 (01/09 0756) BP: (138-160)/(79-92) 151/85 (01/09 0756) SpO2:  [95 %-97 %] 97 % (01/09 0756) Weight:  [86.6 kg] 86.6 kg (01/09 0453) Constitutional:  Alert, no acute distress HEENT: [place] AT, moist mucus membranes Cardiovascular: No clubbing, cyanosis, or edema Respiratory: Normal respiratory effort GU: Foley catheter in place draining clear, yellow urine Skin: No rashes, bruises or suspicious lesions Neurologic: Limited speech, spontaneous movements only of the LUE Psychiatric: Normal mood and affect  Laboratory Data:  Recent Labs    06/18/24 1529 06/19/24 0505 06/20/24 0545  WBC 8.6  7.8 6.9  HGB 13.0 12.4* 11.9*  HCT 38.4* 36.3* 35.7*   Recent Labs    06/18/24 1529 06/19/24 0505 06/20/24 0545  NA 135 141 140  K 4.5 3.5 3.6  CL 99 103 101  CO2 25 27 27   GLUCOSE 405* 84 209*  BUN 17 15 12   CREATININE 1.17 1.06 1.08  CALCIUM  9.8 9.8 9.4   Urinalysis    Component Value Date/Time   COLORURINE YELLOW (A) 06/17/2024 1439   APPEARANCEUR CLOUDY (A) 06/17/2024 1439   LABSPEC 1.015 06/17/2024 1439   PHURINE 5.0 06/17/2024 1439   GLUCOSEU >=500 (A) 06/17/2024 1439   HGBUR LARGE (A) 06/17/2024 1439   BILIRUBINUR NEGATIVE 06/17/2024 1439   KETONESUR NEGATIVE 06/17/2024 1439   PROTEINUR 100 (A) 06/17/2024 1439   NITRITE POSITIVE (A) 06/17/2024 1439   LEUKOCYTESUR MODERATE (A) 06/17/2024 1439   Results for orders placed or performed during the hospital encounter of 06/17/24  Resp panel by RT-PCR (RSV, Flu A&B, Covid) Anterior Nasal Swab     Status: None   Collection Time: 06/17/24  2:05 PM   Specimen: Anterior Nasal Swab  Result Value Ref Range Status   SARS Coronavirus 2 by RT PCR NEGATIVE NEGATIVE Final    Comment: (NOTE) SARS-CoV-2 target nucleic acids are NOT DETECTED.  The SARS-CoV-2 RNA is generally detectable in upper respiratory specimens during the acute phase of infection. The lowest concentration of SARS-CoV-2 viral copies this assay can detect is 138 copies/mL. A negative result does not preclude SARS-Cov-2 infection and should not be used as the sole basis for treatment or other patient management decisions. A negative result may occur with  improper specimen collection/handling, submission of specimen other than nasopharyngeal swab, presence of viral mutation(s) within the areas targeted by this assay, and inadequate number of viral copies(<138 copies/mL). A negative result must be combined with clinical observations, patient history, and epidemiological information. The expected result is Negative.  Fact Sheet for Patients:   bloggercourse.com  Fact Sheet for Healthcare Providers:  seriousbroker.it  This test is no t yet approved or cleared by the [place]  FDA and  has been authorized for detection and/or diagnosis of SARS-CoV-2 by FDA under an Emergency Use Authorization (EUA). This EUA will remain  in effect (meaning this test can be used) for the duration of the COVID-19 declaration under Section 564(b)(1) of the Act, 21 U.S.C.section 360bbb-3(b)(1), unless the authorization is terminated  or revoked sooner.       Influenza A by PCR NEGATIVE NEGATIVE Final   Influenza B by PCR NEGATIVE NEGATIVE Final    Comment: (NOTE) The Xpert Xpress SARS-CoV-2/FLU/RSV plus assay is intended as an aid in the diagnosis of influenza from Nasopharyngeal swab specimens and should not be used as a sole basis for treatment. Nasal washings and aspirates are unacceptable  for Xpert Xpress SARS-CoV-2/FLU/RSV testing.  Fact Sheet for Patients: bloggercourse.com  Fact Sheet for Healthcare Providers: seriousbroker.it  This test is not yet approved or cleared by the [place]  FDA and has been authorized for detection and/or diagnosis of SARS-CoV-2 by FDA under an Emergency Use Authorization (EUA). This EUA will remain in effect (meaning this test can be used) for the duration of the COVID-19 declaration under Section 564(b)(1) of the Act, 21 U.S.C. section 360bbb-3(b)(1), unless the authorization is terminated or revoked.     Resp Syncytial Virus by PCR NEGATIVE NEGATIVE Final    Comment: (NOTE) Fact Sheet for Patients: bloggercourse.com  Fact Sheet for Healthcare Providers: seriousbroker.it  This test is not yet approved or cleared by the [place]  FDA and has been authorized for detection and/or diagnosis of SARS-CoV-2 by FDA under an Emergency Use  Authorization (EUA). This EUA will remain in effect (meaning this test can be used) for the duration of the COVID-19 declaration under Section 564(b)(1) of the Act, 21 U.S.C. section 360bbb-3(b)(1), unless the authorization is terminated or revoked.  Performed at [facility], [address], [place], [place] [address]   Urine Culture     Status: Abnormal   Collection Time: 06/17/24  2:39 PM   Specimen: Urine, Catheterized  Result Value Ref Range Status   Specimen Description   Final    URINE, CATHETERIZED Performed at [facility], [address] [place], [place] [address]    Special Requests   Final    NONE Performed at [facility], [address] [place], [place] [address]    Culture >=100,000 COLONIES/mL ESCHERICHIA COLI (A)  Final   Report Status 06/19/2024 FINAL  Final   Organism ID, Bacteria ESCHERICHIA COLI (A)  Final      Susceptibility   Escherichia coli - MIC*    AMPICILLIN >=32 RESISTANT Resistant     CEFAZOLIN  (URINE) Value in next row Resistant      >=32 RESISTANTThis is a modified FDA-approved test that has been validated and its performance characteristics determined by the reporting laboratory.  This laboratory is certified under the Clinical Laboratory Improvement Amendments CLIA as qualified to perform high complexity clinical laboratory testing.    CEFEPIME  Value in next row Sensitive      >=32 RESISTANTThis is a modified FDA-approved test that has been validated and its performance characteristics determined by the reporting laboratory.  This laboratory is certified under the Clinical Laboratory Improvement Amendments CLIA as qualified to perform high complexity clinical laboratory testing.    ERTAPENEM Value in next row Sensitive      >=32 RESISTANTThis is a modified FDA-approved test that has been validated and its performance characteristics determined by the reporting laboratory.  This laboratory is certified under the Clinical  Laboratory Improvement Amendments CLIA as qualified to perform high complexity clinical laboratory testing.    CEFTRIAXONE  Value in next row Sensitive      >=32 RESISTANTThis is a modified FDA-approved test that has been validated and its performance characteristics determined by the reporting laboratory.  This laboratory is certified under the Clinical Laboratory Improvement Amendments CLIA as qualified to perform high complexity clinical laboratory testing.    CIPROFLOXACIN Value in next row Intermediate      >=32 RESISTANTThis is a modified FDA-approved test that has been validated and its performance characteristics determined by the reporting laboratory.  This laboratory is certified under the Clinical Laboratory Improvement Amendments CLIA as qualified to perform high complexity clinical laboratory testing.  GENTAMICIN Value in next row Sensitive      >=32 RESISTANTThis is a modified FDA-approved test that has been validated and its performance characteristics determined by the reporting laboratory.  This laboratory is certified under the Clinical Laboratory Improvement Amendments CLIA as qualified to perform high complexity clinical laboratory testing.    NITROFURANTOIN Value in next row Resistant      >=32 RESISTANTThis is a modified FDA-approved test that has been validated and its performance characteristics determined by the reporting laboratory.  This laboratory is certified under the Clinical Laboratory Improvement Amendments CLIA as qualified to perform high complexity clinical laboratory testing.    TRIMETH/SULFA Value in next row Resistant      >=32 RESISTANTThis is a modified FDA-approved test that has been validated and its performance characteristics determined by the reporting laboratory.  This laboratory is certified under the Clinical Laboratory Improvement Amendments CLIA as qualified to perform high complexity clinical laboratory testing.    AMPICILLIN/SULBACTAM Value in next row  Resistant      >=32 RESISTANTThis is a modified FDA-approved test that has been validated and its performance characteristics determined by the reporting laboratory.  This laboratory is certified under the Clinical Laboratory Improvement Amendments CLIA as qualified to perform high complexity clinical laboratory testing.    PIP/TAZO Value in next row Sensitive      8 SENSITIVEThis is a modified FDA-approved test that has been validated and its performance characteristics determined by the reporting laboratory.  This laboratory is certified under the Clinical Laboratory Improvement Amendments CLIA as qualified to perform high complexity clinical laboratory testing.    MEROPENEM Value in next row Sensitive      8 SENSITIVEThis is a modified FDA-approved test that has been validated and its performance characteristics determined by the reporting laboratory.  This laboratory is certified under the Clinical Laboratory Improvement Amendments CLIA as qualified to perform high complexity clinical laboratory testing.    * >=100,000 COLONIES/mL ESCHERICHIA COLI  C Difficile Quick Screen w PCR reflex     Status: None   Collection Time: 06/17/24  2:39 PM   Specimen: STOOL  Result Value Ref Range Status   C Diff antigen NEGATIVE NEGATIVE Final   C Diff toxin NEGATIVE NEGATIVE Final   C Diff interpretation No C. difficile detected.  Final    Comment: Performed at [facility], [address] [place], [place] [address]  Gastrointestinal Panel by PCR , Stool     Status: None   Collection Time: 06/17/24  2:39 PM   Specimen: Stool  Result Value Ref Range Status   Campylobacter species NOT DETECTED NOT DETECTED Final   Plesimonas shigelloides NOT DETECTED NOT DETECTED Final   Salmonella species NOT DETECTED NOT DETECTED Final   Yersinia enterocolitica NOT DETECTED NOT DETECTED Final   Vibrio species NOT DETECTED NOT DETECTED Final   Vibrio cholerae NOT DETECTED NOT DETECTED Final   Enteroaggregative  E coli (EAEC) NOT DETECTED NOT DETECTED Final   Enteropathogenic E coli (EPEC) NOT DETECTED NOT DETECTED Final   Enterotoxigenic E coli (ETEC) NOT DETECTED NOT DETECTED Final   Shiga like toxin producing E coli (STEC) NOT DETECTED NOT DETECTED Final   Shigella/Enteroinvasive E coli (EIEC) NOT DETECTED NOT DETECTED Final   Cryptosporidium NOT DETECTED NOT DETECTED Final   Cyclospora cayetanensis NOT DETECTED NOT DETECTED Final   Entamoeba histolytica NOT DETECTED NOT DETECTED Final   Giardia lamblia NOT DETECTED NOT DETECTED Final   Adenovirus F40/41 NOT DETECTED NOT DETECTED Final  Astrovirus NOT DETECTED NOT DETECTED Final   Norovirus GI/GII NOT DETECTED NOT DETECTED Final   Rotavirus A NOT DETECTED NOT DETECTED Final   Sapovirus (I, II, IV, and V) NOT DETECTED NOT DETECTED Final    Comment: Performed at [facility], [address] [place], [place] [address]    Radiologic Imaging: [facility] Chest Port 1 View Result Date: 06/18/2024 CLINICAL DATA:  Aspiration into the airway. EXAM: PORTABLE CHEST 1 VIEW COMPARISON:  06/17/2024 FINDINGS: Patchy and linear density in the left lower lobe. The remainder of the lungs are clear with normal vascularity. Normal-sized heart. Aortic arch atheromatous calcifications. Unremarkable bones. IMPRESSION: Left lower lobe atelectasis and possible aspiration pneumonitis. Electronically Signed   By: Elspeth Bathe M.D.   On: 06/18/2024 15:17   Assessment & Plan:  64 y.o. male with PMH multiple CVA, DM2, PE on Eliquis , chronic urinary retention who has failed multiple voiding trials, possible BPH, and recurrent UTI admitted on 06/17/2024 for management of complicated UTI.  He has failed several voiding trials per chart review, he is off Flomax, he is being currently treated for UTI, he has limited mobility, and possible contributors for urinary retention include multiple CVAs, diabetes, and possible BPH.  I think there is a strong possibility that he would not be  successful with a voiding trial, however with his recurrent UTIs I agree that it would be prudent to attempt this regardless.  I spoke with the patient's wife, [name], via telephone.  I recommended against inpatient voiding trial today in the setting of treatment for active infection, as I think he is unlikely to pass a voiding trial in the setting of infection alone aside from the other factors listed above.  I offered outpatient voiding trial with Dr. Georganne next week.  Mrs. Riffe was very clear with me regarding her difficulty transporting the patient to clinic for visits, especially for a 2 part voiding trial.  She would not be comfortable removing the catheter herself in the morning and she does not have help transporting him.  She asked me to document her dissatisfaction with the care that they have received during his admission.  Ultimately she agreed to outpatient voiding trial with Dr. Georganne next week, however she was very dissatisfied with this solution.  I subsequently spoke with Dr. Marsa and we discussed inpatient voiding trial on Monday as an alternative.  I think this would be reasonable, especially in light of the patient's mobility limitations.  If Mrs. Derks is agreeable, I would recommend early a.m. Foley catheter removal on Monday, 06/23/2024, around 7 AM.  We would follow-up with a postvoid bladder scan at least 4 hours later or sooner if unable to void and having pain.  We will tentatively plan to move forward with this approach unless we hear differently, in which case I will set him up with outpatient follow-up with Dr. Georganne as above.  Recommendations: - Inpatient voiding trial on Monday, early a.m. Foley removal (around 7 AM) with post void bladder scan at least 4 hours later or sooner if unable to void and having pain.  Please notify urology of bladder scan results. - Continue culture appropriate antibiotics per primary team  Thank you for involving me in this  patient's care, I will continue to follow along.  Milan Perkins, PA-C 06/20/2024 9:02 AM      [1]  Current Meds  Medication Sig   acetaminophen  (TYLENOL ) 500 MG tablet Take 1,000 mg by mouth every 8 (eight) hours as  needed for mild pain (pain score 1-3).   aspirin  EC 81 MG tablet Take 81 mg by mouth daily.   atorvastatin  (LIPITOR) 40 MG tablet Take 1 tablet (40 mg total) by mouth daily.   calcium -vitamin D (OSCAL WITH D) 500-5 MG-MCG tablet Take 1 tablet by mouth daily with breakfast.   clonazePAM  (KLONOPIN ) 0.5 MG tablet Take 0.5 mg by mouth daily.   cyanocobalamin  (VITAMIN B12) 500 MCG tablet Take 1,000 mcg by mouth daily.   ELIQUIS  5 MG TABS tablet Take 5 mg by mouth 2 (two) times daily.   finasteride  (PROSCAR ) 5 MG tablet Take 5 mg by mouth daily.   gabapentin  (NEURONTIN ) 300 MG capsule Take 300 mg by mouth every evening.   insulin  glargine (LANTUS ) 100 UNIT/ML injection Inject 22 Units into the skin daily. 22-24   latanoprost  (XALATAN ) 0.005 % ophthalmic solution Place 1 drop into both eyes at bedtime.   metFORMIN  (GLUCOPHAGE ) 500 MG tablet Take 1,000 mg by mouth at bedtime.    midodrine (PROAMATINE) 2.5 MG tablet Take 2.5 mg by mouth 3 (three) times daily with meals.   pantoprazole  (PROTONIX ) 40 MG tablet Take 40 mg by mouth daily.   sertraline  (ZOLOFT ) 100 MG tablet Take 150 mg by mouth daily.   [DISCONTINUED] Cholecalciferol  50 MCG (2000 UT) TABS Take 2,000 Units by mouth daily.   [DISCONTINUED] MOUNJARO 2.5 MG/0.5ML Pen Inject 2.5 mg into the skin once a week.  [2] No Active Allergies  "

## 2024-06-20 NOTE — Progress Notes (Signed)
 Physical Therapy Treatment Patient Details Name: Troy Morse MRN: [id number] DOB: 1961-05-27 Today's Date: 06/20/2024   History of Present Illness Pt is a 64 y/o M admitted on 06/17/24 after presenting with c/o increasing weakness & somnolence. Pt is being treated for complicated UTI. PMH: HTN, HLD, DM2, CVA, PE, chronic renal disease    PT Comments  PT/OT co-treat 2/2 to pt's poor activity tolerance and need for +2 assist. Pt is alert but only truly oriented x 2. Thought we were in [place]. He present with flat affect but remains cooperative throughout. He tolerated rolling R and L but rolled L to short sit with increased time and +2 max assist. Sat EOB throughout most of session while performing AAROM in all extremities. He was able able to actively move all extremities however requires assistance to perform movements through full ROM. Pt seems to tolerate all exercises well however is not strong enough to safely attempt standing at this time. High recommend +2 assistance and use of mechanical lift for any/all transfers. Sitting balance improved but does require constant vcs/assistance to prevent LOB. Discussed pt's PLOF with spouse post session. He will benefit from continued skilled PT at DC to maximize his independence while decreasing caregiver burden. Acute PT will continue to follow and progress per current POC.     If plan is discharge home, recommend the following: Two people to help with walking and/or transfers;Two people to help with bathing/dressing/bathroom;Assistance with feeding;Supervision due to cognitive status;Direct supervision/assist for financial management;Direct supervision/assist for medications management     Equipment Recommendations  Hospital bed;Hoyer lift       Precautions / Restrictions Precautions Precautions: Fall Recall of Precautions/Restrictions: Impaired Restrictions Weight Bearing Restrictions Per Provider Order: No     Mobility  Bed  Mobility Overal bed mobility: Needs Assistance Bed Mobility: Supine to Sit, Sit to Supine  Supine to sit: Max assist, +2 for physical assistance, +2 for safety/equipment, Used rails, HOB elevated Sit to supine: Max assist, +2 for physical assistance, +2 for safety/equipment   Transfers  General transfer comment: Session focused on improving balance and strength in all extremities. Unsafe to attempt standing 2/2 to him  not have the strength required to safely attempt      Balance Overall balance assessment: Needs assistance Sitting-balance support: Feet supported Sitting balance-Leahy Scale: Poor Sitting balance - Comments: Sititng balance improved with time but mostly requires min assist  Standing balance comment: unable to safely attempt standing    Communication Communication Communication: Impaired Factors Affecting Communication: Difficulty expressing self;Reduced clarity of speech  Cognition Arousal: Alert Behavior During Therapy: Flat affect, WFL for tasks assessed/performed   PT - Cognitive impairments: History of cognitive impairments   PT - Cognition Comments:  (Increased time to respond. Though we were in [place] but does seem oriented to self and family) Following commands: Intact Following commands impaired: Follows one step commands with increased time    Cueing Cueing Techniques: Verbal cues, Tactile cues, Visual cues     General Comments General comments (skin integrity, edema, etc.): Pt performed AAROM in all extremities and worked on improving core strength to improve balance        PT Goals (current goals can now be found in the care plan section) Acute Rehab PT Goals Patient Stated Goal: get better Progress towards PT goals: Progressing toward goals    Frequency    Min 1X/week       Co-evaluation     PT goals addressed during session: Mobility/safety  with mobility;Balance;Proper use of DME;Strengthening/ROM        AM-PAC PT 6 Clicks  Mobility   Outcome Measure  Help needed turning from your back to your side while in a flat bed without using bedrails?: A Lot Help needed moving from lying on your back to sitting on the side of a flat bed without using bedrails?: Total Help needed moving to and from a bed to a chair (including a wheelchair)?: Total Help needed standing up from a chair using your arms (e.g., wheelchair or bedside chair)?: Total Help needed to walk in hospital room?: Total Help needed climbing 3-5 steps with a railing? : Total 6 Click Score: 7    End of Session   Activity Tolerance: Patient tolerated treatment well Patient left: in bed;with call bell/phone within reach;with bed alarm set Nurse Communication: Mobility status PT Visit Diagnosis: Other abnormalities of gait and mobility (R26.89);Muscle weakness (generalized) (M62.81)     Time: 8974-8941 PT Time Calculation (min) (ACUTE ONLY): 33 min  Charges:    $Therapeutic Activity: 8-22 mins PT General Charges $$ ACUTE PT VISIT: 1 Visit                    Rankin Essex PTA 06/20/2024, 12:50 PM

## 2024-06-21 DIAGNOSIS — N390 Urinary tract infection, site not specified: Secondary | ICD-10-CM | POA: Diagnosis not present

## 2024-06-21 LAB — GLUCOSE, CAPILLARY
Glucose-Capillary: 182 mg/dL — ABNORMAL HIGH (ref 70–99)
Glucose-Capillary: 211 mg/dL — ABNORMAL HIGH (ref 70–99)
Glucose-Capillary: 183 mg/dL — ABNORMAL HIGH (ref 70–99)
Glucose-Capillary: 173 mg/dL — ABNORMAL HIGH (ref 70–99)

## 2024-06-21 MED ORDER — METOPROLOL TARTRATE 25 MG PO TABS
25.0000 mg | ORAL_TABLET | Freq: Two times a day (BID) | ORAL | Status: DC
  Administered 2024-06-21 – 2024-06-27 (×13): 25 mg via ORAL
  Filled 2024-06-21 (×13): qty 1

## 2024-06-21 MED ORDER — HYDRALAZINE HCL 20 MG/ML IJ SOLN: 10.0000 mg | Freq: Four times a day (QID) | INTRAMUSCULAR | Status: DC | PRN

## 2024-06-21 NOTE — Plan of Care (Signed)
" °  Problem: Skin Integrity: Goal: Risk for impaired skin integrity will decrease Outcome: Progressing   Problem: Nutritional: Goal: Maintenance of adequate nutrition will improve Outcome: Progressing   Problem: Pain Managment: Goal: General experience of comfort will improve and/or be controlled Outcome: Progressing   Problem: Safety: Goal: Ability to remain free from injury will improve Outcome: Progressing   "

## 2024-06-21 NOTE — Plan of Care (Signed)
 " Problem: Education: Goal: Knowledge of General Education information will improve Description: Including pain rating scale, medication(s)/side effects and non-pharmacologic comfort measures 06/21/2024 0600 by Theophilus Leverne KIDD, RN Outcome: Progressing 06/21/2024 0600 by Theophilus Leverne KIDD, RN Outcome: Progressing   Problem: Health Behavior/Discharge Planning: Goal: Ability to manage health-related needs will improve 06/21/2024 0600 by Theophilus Leverne KIDD, RN Outcome: Progressing 06/21/2024 0600 by Theophilus Leverne KIDD, RN Outcome: Progressing   Problem: Clinical Measurements: Goal: Ability to maintain clinical measurements within normal limits will improve 06/21/2024 0600 by Theophilus Leverne KIDD, RN Outcome: Progressing 06/21/2024 0600 by Theophilus Leverne KIDD, RN Outcome: Progressing Goal: Will remain free from infection 06/21/2024 0600 by Theophilus Leverne KIDD, RN Outcome: Progressing 06/21/2024 0600 by Theophilus Leverne KIDD, RN Outcome: Progressing Goal: Diagnostic test results will improve 06/21/2024 0600 by Theophilus Leverne KIDD, RN Outcome: Progressing 06/21/2024 0600 by Theophilus Leverne KIDD, RN Outcome: Progressing Goal: Respiratory complications will improve 06/21/2024 0600 by Theophilus Leverne KIDD, RN Outcome: Progressing 06/21/2024 0600 by Theophilus Leverne KIDD, RN Outcome: Progressing Goal: Cardiovascular complication will be avoided 06/21/2024 0600 by Theophilus Leverne KIDD, RN Outcome: Progressing 06/21/2024 0600 by Theophilus Leverne KIDD, RN Outcome: Progressing   Problem: Activity: Goal: Risk for activity intolerance will decrease 06/21/2024 0600 by Theophilus Leverne KIDD, RN Outcome: Progressing 06/21/2024 0600 by Theophilus Leverne KIDD, RN Outcome: Progressing   Problem: Nutrition: Goal: Adequate nutrition will be maintained 06/21/2024 0600 by Theophilus Leverne KIDD, RN Outcome: Progressing 06/21/2024 0600 by Theophilus Leverne KIDD, RN Outcome: Progressing   Problem:  Coping: Goal: Level of anxiety will decrease 06/21/2024 0600 by Theophilus Leverne KIDD, RN Outcome: Progressing 06/21/2024 0600 by Theophilus Leverne KIDD, RN Outcome: Progressing   Problem: Elimination: Goal: Will not experience complications related to bowel motility 06/21/2024 0600 by Theophilus Leverne KIDD, RN Outcome: Progressing 06/21/2024 0600 by Theophilus Leverne KIDD, RN Outcome: Progressing Goal: Will not experience complications related to urinary retention 06/21/2024 0600 by Theophilus Leverne KIDD, RN Outcome: Progressing 06/21/2024 0600 by Theophilus Leverne KIDD, RN Outcome: Progressing   Problem: Pain Managment: Goal: General experience of comfort will improve and/or be controlled 06/21/2024 0600 by Theophilus Leverne KIDD, RN Outcome: Progressing 06/21/2024 0600 by Theophilus Leverne KIDD, RN Outcome: Progressing   Problem: Safety: Goal: Ability to remain free from injury will improve 06/21/2024 0600 by Theophilus Leverne KIDD, RN Outcome: Progressing 06/21/2024 0600 by Theophilus Leverne KIDD, RN Outcome: Progressing   Problem: Skin Integrity: Goal: Risk for impaired skin integrity will decrease 06/21/2024 0600 by Theophilus Leverne KIDD, RN Outcome: Progressing 06/21/2024 0600 by Theophilus Leverne KIDD, RN Outcome: Progressing   Problem: Education: Goal: Ability to describe self-care measures that may prevent or decrease complications (Diabetes Survival Skills Education) will improve 06/21/2024 0600 by Theophilus Leverne KIDD, RN Outcome: Progressing 06/21/2024 0600 by Theophilus Leverne KIDD, RN Outcome: Progressing Goal: Individualized Educational Video(s) 06/21/2024 0600 by Theophilus Leverne KIDD, RN Outcome: Progressing 06/21/2024 0600 by Theophilus Leverne KIDD, RN Outcome: Progressing   Problem: Coping: Goal: Ability to adjust to condition or change in health will improve 06/21/2024 0600 by Theophilus Leverne KIDD, RN Outcome: Progressing 06/21/2024 0600 by Theophilus Leverne KIDD, RN Outcome:  Progressing   Problem: Fluid Volume: Goal: Ability to maintain a balanced intake and output will improve Outcome: Progressing   Problem: Health Behavior/Discharge Planning: Goal: Ability to identify and utilize available resources and services will improve Outcome: Progressing Goal: Ability to manage health-related needs will improve Outcome: Progressing   Problem: Metabolic: Goal: Ability to maintain appropriate glucose levels will improve Outcome: Progressing  Problem: Nutritional: Goal: Maintenance of adequate nutrition will improve Outcome: Progressing Goal: Progress toward achieving an optimal weight will improve Outcome: Progressing   Problem: Skin Integrity: Goal: Risk for impaired skin integrity will decrease Outcome: Progressing   Problem: Tissue Perfusion: Goal: Adequacy of tissue perfusion will improve Outcome: Progressing   "

## 2024-06-21 NOTE — Progress Notes (Signed)
 " PROGRESS NOTE    Troy Morse   FMW:[id number] DOB: May 27, 1961  DOA: 06/17/2024 Date of Service: 06/21/2024 which is hospital day 2  PCP: Troy Alyce RAMAN, MD    Hospital course / significant events:   HPI: Troy Morse is a 64 y.o. year old male with medical history of hypertension, hyperlipidemia, type 2 diabetes, history of CVA, history of PE on Eliquis  who presented to the ED with increasing weakness and somnolence.  Patient with poor functional status who has chronic foley catheter and later treatment for UTI.  His last catheter was placed 6 weeks ago. Per wife, he was supposed to have urology appointment to remove catheter but cancelled due to weakness. He denies any fevers or chills.   01/06: On arrival to the ED patient was noted to be HDS. CBC without leukocytosis, CMP with moderate hyperglycemia but.  Respiratory panel negative for COVID, flu, RSV.  UA consistent with urinary tract infection.  Urine culture pending.  Patient given 1 dose of ceftriaxone , appears last culture showed E. coli.  Admitted to hospitalist.  01/07: ongoing confusion/encephalopathic. UCx again (+)Ecoli, pending susceptibilities. Aspiration event this morning, question vomiting. NPO pending SLP eval, continue NS fluids. Foley exchanged today  01/08: much more alert / baseline. UCx (+)EColi multiple resistances, d/w ID pharmacist - plan for gentamicin dose and DC home however pt's wife requests he stays here for void trial, she would like to establish him w/ [facility] urologist instead of [facility]. D/w urology - will see pt, for now can continue w/ ceftriaxone  01/09: d/w urology, recommendation is for outpatient void trial but there is concern re transportation and mobility issues - d/w urology, will plan for void trial here Monday, will have to be Monday given limited availability of urology coverage through the weekend in case of adverse outcome. I attempted to call Troy Morse to relay this plan this morning at 09:50  but no answer, will attempt again later. PT/OT also to see, given concern for pt weaker than when he was DC from rehab at [facility] at which point he was ambulating.  01/10: await void trial Mon 01/12   Consultants:  Urology  Procedures/Surgeries: none      ASSESSMENT & PLAN:   Urinary Tract Infection UCX (+)Ecoli multiple resistances Altered mental status / metabolic encephalopathy d/t UTI - resolved  UTI POA associated w/ catheter which was also POA  IV Ceftriaxone , option for IV gentamicin x1 but since he will be in the hospital anyway will continue ceftriaxone  for now  Given pt's urinary retention, he has foley catheter. Plan was to follow up with urology on 06/16/24 for voiding trial but did not go d/t weakness. Foley exchanged here 01/07 Of note, pt's wife over the phone was concerned about black mold in there she notes there was small black round balls coming out the urethra around the catheter and down the groin in the ED yesterday - no noted material that matches this description though there was noted to be significant sediment/debris.   Urinary retention Of note, pt's wife Troy Morse has requested void trial here stating she does not want to take him home until he's urinating on his own.  Urology consult to discuss plan for void trial / other input  I have advised Troy Morse that IF a void trial is attempted but he is unable to void independently then will need to re-place Foley and will plan release him from hospital to follow outpatient if he is medically stable. I  have advised her also that either way that I will defer to urology re: void trial given he is a complicated catheter placement. Urology consult - recommendation is for outpatient void trial but there is concern re transportation and mobility issues so will plan for void trial here Monday, will have to be Monday given limited availability of urology coverage through the weekend in case of adverse outcome. I attempted  to call Troy Morse to relay this plan this morning at 09:50 but no answer, will attempt again later.  Continue finasteride     Type 2 diabetes w/ hyperglycemia  Uncontrolled diabetes may be leading to recurrent UTI. Needs good control of his diabetes, discussed extensively with wife and pt.  Eating better, resumed achs and basal insulin  Holding home metformin .   Continue home gabapentin  for neuropathy.   Aspiration event witnessed by RN 06/18/24 Potential aspiration pneumonitis but no symptoms Aspiration d/t encephalopathy, which has resolved Given CVA remains aspiration risk especially w/ other causes for weakness (currently, infection) Issue has resolved now that pt is more alert, he is tolerating diet  SLP eval --> regular diet IV fluids x1L for now   RUE swelling w/o tenderness or pitting edema Eliquis  - unlikely clot Suspect dependent edema pt keeps this arm tight against his side - prop up in a slightly winged out abduction using pillows, on eliquis  unlikely DVT but will obtain imaging question some other sort stenosis if this isn't rsolving   Hx CVA w/ residual deficits  See [facility] admission 10/2023 Of note, PFO on echo, per neurology this was NOT contributory to CVA, see consult note 11/02/2023 Secondary prevention: Statin, eliquis , Glc control, BP control   Hx PE 11/2023 eliquis   Essential Hypertension   Not on any pharmacotherapy  History of hypotension.   continue to monitor given active infection.   Holding home midodrine 2.5 mg 3 times daily.   Hyperlipidemia/CAD:  Continue home statin.   CKD 3A: At baseline.   Renally dose medications as needed, avoid nephrotoxic agents. Monitor BMP   GAD Continue home clonazepam , SSRI    GERD:  Continue home PPI   Hx of PE: on elquis,  continue home eliquis .    Debility: 2/2 to strokes since July.  Bed bound since stroke.  need continued PT/OT as he gets home PT/OT.  PT/OT consulted. Qualifies for rehab  Of note,  he was ambulatory on discharge from [facility] rehab - w/ yes/no questions I ascertained that pt has not been getting up out of med much since being back home. Suspect worsening weakness d/t deconditioning + current UTI complicated of course by underlying CVA deficits  W/ yes/no questions, pt is amenable to rehab    Diarrhea: Appears to be chronic.  C. Diff negative.  GI panel also negative Monitor      Overweight based on BMI: Body mass index is 27.39 kg/m.SABRA Significantly low or high BMI is associated with higher medical risk.  Underweight - under 18  overweight - 25 to 29 obese - 30 or more Class 1 obesity: BMI of 30.0 to 34 Class 2 obesity: BMI of 35.0 to 39 Class 3 obesity: BMI of 40.0 to 49 Super Morbid Obesity: BMI 50-59 Super-super Morbid Obesity: BMI 60+ Healthy nutrition and physical activity advised as adjunct to other disease management and risk reduction treatments    DVT prophylaxis: eliquis  IV fluids: NS continuous IV fluids  x1L Nutrition: regular (S/p SLP eval) Central lines / other devices: Foley cath changed out today 06/21/2024  Code Status: DNR ACP documentation reviewed:  none on file in [facility]  [facility] needs: likely for SNF rehab placement  Barriers to dispo: at this point awaiting foley removal and void trial Monday (today is Friday) see above. At any rate he is likely to be here until SNF can be arranged.              Subjective / Brief ROS:  Pt alert No concerns other than some edema/swelling RUE     Family Communication: wife present at bedside on rounds. Of note, also spoke w/ her yesterday afternoon re: plan for void trial Mon, option for rehab placement     Objective Findings:  Vitals:   06/20/24 1618 06/20/24 2040 06/21/24 0534 06/21/24 0744  BP: 127/70 (!) 194/175 (!) 163/89 (!) 164/91  Pulse: 69 75 62 65  Resp: 16 18 18 16   Temp: 97.6 F (36.4 C) 98.2 F (36.8 C) 98.3 F (36.8 C) 97.7 F (36.5 C)  TempSrc:      SpO2: 100% 99%  97% 100%  Weight:      Height:        Intake/Output Summary (Last 24 hours) at 06/21/2024 1319 Last data filed at 06/21/2024 0900 Gross per 24 hour  Intake 240 ml  Output 800 ml  Net -560 ml   Filed Weights   06/18/24 0405 06/19/24 0500 06/20/24 0453  Weight: 83.1 kg 86.7 kg 86.6 kg    Examination:  Physical Exam Constitutional:      General: He is not in acute distress. Cardiovascular:     Rate and Rhythm: Normal rate and regular rhythm.  Pulmonary:     Effort: Pulmonary effort is normal.     Breath sounds: Normal breath sounds.  Musculoskeletal:        General: Swelling (RUE nonpitting, no erythema, no tenderness) present.     Right lower leg: No edema.     Left lower leg: No edema.  Neurological:     Mental Status: He is alert. Mental status is at baseline.     Comments: (Per pt's wife - not very conversational at baseline but able to answer yes/no and more alert.) alert today and answering yes/no questions easily, he is able to speak longer sentences , following commands  Psychiatric:        Behavior: Behavior normal.          Scheduled Medications:   apixaban   5 mg Oral BID   aspirin  EC  81 mg Oral Daily   atorvastatin   40 mg Oral Daily   brimonidine   1 drop Both Eyes Q8H   calcium -vitamin D  1 tablet Oral Q breakfast   Chlorhexidine  Gluconate Cloth  6 each Topical Daily   clonazePAM   0.5 mg Oral BID   cyanocobalamin   1,000 mcg Oral Daily   feeding supplement (GLUCERNA SHAKE)  237 mL Oral TID BM   finasteride   5 mg Oral Daily   gabapentin   300 mg Oral BID   insulin  aspart  0-20 Units Subcutaneous TID WC   insulin  aspart  0-5 Units Subcutaneous QHS   insulin  aspart  5 Units Subcutaneous TID WC   insulin  glargine  30 Units Subcutaneous Daily   latanoprost   1 drop Both Eyes QHS   metoprolol  tartrate  25 mg Oral BID   multivitamin with minerals  1 tablet Oral Daily   pantoprazole   40 mg Oral Daily   sertraline   150 mg Oral Daily   sodium chloride  flush   3  mL Intravenous Q12H    Continuous Infusions:  cefTRIAXone  (ROCEPHIN )  IV 2 g (06/20/24 1321)   promethazine  (PHENERGAN ) injection (IM or IVPB) Stopped (06/18/24 1056)    PRN Medications:  acetaminophen  **OR** acetaminophen , bisacodyl , dextrose , hydrALAZINE , labetalol , ondansetron  **OR** ondansetron  (ZOFRAN ) IV, promethazine  (PHENERGAN ) injection (IM or IVPB)  Antimicrobials from admission:  Anti-infectives (From admission, onward)    Start     Dose/Rate Route Frequency Ordered Stop   06/19/24 1400  cefTRIAXone  (ROCEPHIN ) 2 g in sodium chloride  0.9 % 100 mL IVPB        2 g 200 mL/hr over 30 Minutes Intravenous Every 24 hours 06/19/24 0950     06/18/24 1300  cefTRIAXone  (ROCEPHIN ) 1 g in sodium chloride  0.9 % 100 mL IVPB  Status:  Discontinued        1 g 200 mL/hr over 30 Minutes Intravenous Every 24 hours 06/18/24 1154 06/19/24 0950   06/17/24 1515  cefTRIAXone  (ROCEPHIN ) 1 g in sodium chloride  0.9 % 100 mL IVPB        1 g 200 mL/hr over 30 Minutes Intravenous  Once 06/17/24 1508 06/17/24 1757           Data Reviewed:  I have personally reviewed the following...  CBC: Recent Labs  Lab 06/17/24 1405 06/18/24 0455 06/18/24 1529 06/19/24 0505 06/20/24 0545  WBC 10.1 9.2 8.6 7.8 6.9  HGB 13.0 12.9* 13.0 12.4* 11.9*  HCT 37.2* 37.4* 38.4* 36.3* 35.7*  MCV 93.7 93.3 94.6 94.0 94.9  PLT 156 153 145* 152 143*   Basic Metabolic Panel: Recent Labs  Lab 06/17/24 1405 06/17/24 1455 06/18/24 0455 06/18/24 1529 06/19/24 0505 06/20/24 0545  NA 137  --  135 135 141 140  K 4.1  --  4.1 4.5 3.5 3.6  CL 100  --  99 99 103 101  CO2 25  --  24 25 27 27   GLUCOSE 355*  --  286* 405* 84 209*  BUN 20  --  18 17 15 12   CREATININE 1.13  --  1.12 1.17 1.06 1.08  CALCIUM  10.1  --  9.8 9.8 9.8 9.4  MG  --  1.6*  --   --   --   --    GFR: Estimated Creatinine Clearance: 71.3 mL/min (by C-G formula based on SCr of 1.08 mg/dL). Liver Function Tests: Recent Labs  Lab  06/17/24 1405  AST 23  ALT 32  ALKPHOS 79  BILITOT 0.4  PROT 6.9  ALBUMIN 4.0   No results for input(s): LIPASE, AMYLASE in the last 168 hours. No results for input(s): AMMONIA in the last 168 hours. Coagulation Profile: No results for input(s): INR, PROTIME in the last 168 hours. Cardiac Enzymes: No results for input(s): CKTOTAL, CKMB, CKMBINDEX, TROPONINI in the last 168 hours. BNP (last 3 results) No results for input(s): PROBNP in the last 8760 hours. HbA1C: No results for input(s): HGBA1C in the last 72 hours.  CBG: Recent Labs  Lab 06/20/24 1145 06/20/24 1619 06/20/24 2041 06/21/24 0741 06/21/24 1155  GLUCAP 241* 251* 157* 182* 211*   Lipid Profile: No results for input(s): CHOL, HDL, LDLCALC, TRIG, CHOLHDL, LDLDIRECT in the last 72 hours. Thyroid Function Tests: No results for input(s): TSH, T4TOTAL, FREET4, T3FREE, THYROIDAB in the last 72 hours. Anemia Panel: No results for input(s): VITAMINB12, FOLATE, FERRITIN, TIBC, IRON, RETICCTPCT in the last 72 hours. Most Recent Urinalysis On File:     Component Value Date/Time   COLORURINE YELLOW (A) 06/17/2024 1439  APPEARANCEUR CLOUDY (A) 06/17/2024 1439   LABSPEC 1.015 06/17/2024 1439   PHURINE 5.0 06/17/2024 1439   GLUCOSEU >=500 (A) 06/17/2024 1439   HGBUR LARGE (A) 06/17/2024 1439   BILIRUBINUR NEGATIVE 06/17/2024 1439   KETONESUR NEGATIVE 06/17/2024 1439   PROTEINUR 100 (A) 06/17/2024 1439   NITRITE POSITIVE (A) 06/17/2024 1439   LEUKOCYTESUR MODERATE (A) 06/17/2024 1439   Sepsis Labs: @LABRCNTIP (procalcitonin:4,lacticidven:4) Microbiology: Recent Results (from the past 240 hours)  Resp panel by RT-PCR (RSV, Flu A&B, Covid) Anterior Nasal Swab     Status: None   Collection Time: 06/17/24  2:05 PM   Specimen: Anterior Nasal Swab  Result Value Ref Range Status   SARS Coronavirus 2 by RT PCR NEGATIVE NEGATIVE Final    Comment: (NOTE) SARS-CoV-2  target nucleic acids are NOT DETECTED.  The SARS-CoV-2 RNA is generally detectable in upper respiratory specimens during the acute phase of infection. The lowest concentration of SARS-CoV-2 viral copies this assay can detect is 138 copies/mL. A negative result does not preclude SARS-Cov-2 infection and should not be used as the sole basis for treatment or other patient management decisions. A negative result may occur with  improper specimen collection/handling, submission of specimen other than nasopharyngeal swab, presence of viral mutation(s) within the areas targeted by this assay, and inadequate number of viral copies(<138 copies/mL). A negative result must be combined with clinical observations, patient history, and epidemiological information. The expected result is Negative.  Fact Sheet for Patients:  bloggercourse.com  Fact Sheet for Healthcare Providers:  seriousbroker.it  This test is no t yet approved or cleared by the [place]  FDA and  has been authorized for detection and/or diagnosis of SARS-CoV-2 by FDA under an Emergency Use Authorization (EUA). This EUA will remain  in effect (meaning this test can be used) for the duration of the COVID-19 declaration under Section 564(b)(1) of the Act, 21 U.S.C.section 360bbb-3(b)(1), unless the authorization is terminated  or revoked sooner.       Influenza A by PCR NEGATIVE NEGATIVE Final   Influenza B by PCR NEGATIVE NEGATIVE Final    Comment: (NOTE) The Xpert Xpress SARS-CoV-2/FLU/RSV plus assay is intended as an aid in the diagnosis of influenza from Nasopharyngeal swab specimens and should not be used as a sole basis for treatment. Nasal washings and aspirates are unacceptable for Xpert Xpress SARS-CoV-2/FLU/RSV testing.  Fact Sheet for Patients: bloggercourse.com  Fact Sheet for Healthcare  Providers: seriousbroker.it  This test is not yet approved or cleared by the [place]  FDA and has been authorized for detection and/or diagnosis of SARS-CoV-2 by FDA under an Emergency Use Authorization (EUA). This EUA will remain in effect (meaning this test can be used) for the duration of the COVID-19 declaration under Section 564(b)(1) of the Act, 21 U.S.C. section 360bbb-3(b)(1), unless the authorization is terminated or revoked.     Resp Syncytial Virus by PCR NEGATIVE NEGATIVE Final    Comment: (NOTE) Fact Sheet for Patients: bloggercourse.com  Fact Sheet for Healthcare Providers: seriousbroker.it  This test is not yet approved or cleared by the [place]  FDA and has been authorized for detection and/or diagnosis of SARS-CoV-2 by FDA under an Emergency Use Authorization (EUA). This EUA will remain in effect (meaning this test can be used) for the duration of the COVID-19 declaration under Section 564(b)(1) of the Act, 21 U.S.C. section 360bbb-3(b)(1), unless the authorization is terminated or revoked.  Performed at [facility], [address] [place], [place] [address]   Urine Culture  Status: Abnormal   Collection Time: 06/17/24  2:39 PM   Specimen: Urine, Catheterized  Result Value Ref Range Status   Specimen Description   Final    URINE, CATHETERIZED Performed at [facility], [address] [place], [place] [address]    Special Requests   Final    NONE Performed at [facility], [address] [place], [place] [address]    Culture >=100,000 COLONIES/mL ESCHERICHIA COLI (A)  Final   Report Status 06/19/2024 FINAL  Final   Organism ID, Bacteria ESCHERICHIA COLI (A)  Final      Susceptibility   Escherichia coli - MIC*    AMPICILLIN >=32 RESISTANT Resistant     CEFAZOLIN  (URINE) Value in next row Resistant      >=32 RESISTANTThis is a  modified FDA-approved test that has been validated and its performance characteristics determined by the reporting laboratory.  This laboratory is certified under the Clinical Laboratory Improvement Amendments CLIA as qualified to perform high complexity clinical laboratory testing.    CEFEPIME  Value in next row Sensitive      >=32 RESISTANTThis is a modified FDA-approved test that has been validated and its performance characteristics determined by the reporting laboratory.  This laboratory is certified under the Clinical Laboratory Improvement Amendments CLIA as qualified to perform high complexity clinical laboratory testing.    ERTAPENEM Value in next row Sensitive      >=32 RESISTANTThis is a modified FDA-approved test that has been validated and its performance characteristics determined by the reporting laboratory.  This laboratory is certified under the Clinical Laboratory Improvement Amendments CLIA as qualified to perform high complexity clinical laboratory testing.    CEFTRIAXONE  Value in next row Sensitive      >=32 RESISTANTThis is a modified FDA-approved test that has been validated and its performance characteristics determined by the reporting laboratory.  This laboratory is certified under the Clinical Laboratory Improvement Amendments CLIA as qualified to perform high complexity clinical laboratory testing.    CIPROFLOXACIN Value in next row Intermediate      >=32 RESISTANTThis is a modified FDA-approved test that has been validated and its performance characteristics determined by the reporting laboratory.  This laboratory is certified under the Clinical Laboratory Improvement Amendments CLIA as qualified to perform high complexity clinical laboratory testing.    GENTAMICIN Value in next row Sensitive      >=32 RESISTANTThis is a modified FDA-approved test that has been validated and its performance characteristics determined by the reporting laboratory.  This laboratory is certified  under the Clinical Laboratory Improvement Amendments CLIA as qualified to perform high complexity clinical laboratory testing.    NITROFURANTOIN Value in next row Resistant      >=32 RESISTANTThis is a modified FDA-approved test that has been validated and its performance characteristics determined by the reporting laboratory.  This laboratory is certified under the Clinical Laboratory Improvement Amendments CLIA as qualified to perform high complexity clinical laboratory testing.    TRIMETH/SULFA Value in next row Resistant      >=32 RESISTANTThis is a modified FDA-approved test that has been validated and its performance characteristics determined by the reporting laboratory.  This laboratory is certified under the Clinical Laboratory Improvement Amendments CLIA as qualified to perform high complexity clinical laboratory testing.    AMPICILLIN/SULBACTAM Value in next row Resistant      >=32 RESISTANTThis is a modified FDA-approved test that has been validated and its performance characteristics determined by the reporting laboratory.  This laboratory is certified  under the Clinical Laboratory Improvement Amendments CLIA as qualified to perform high complexity clinical laboratory testing.    PIP/TAZO Value in next row Sensitive      8 SENSITIVEThis is a modified FDA-approved test that has been validated and its performance characteristics determined by the reporting laboratory.  This laboratory is certified under the Clinical Laboratory Improvement Amendments CLIA as qualified to perform high complexity clinical laboratory testing.    MEROPENEM Value in next row Sensitive      8 SENSITIVEThis is a modified FDA-approved test that has been validated and its performance characteristics determined by the reporting laboratory.  This laboratory is certified under the Clinical Laboratory Improvement Amendments CLIA as qualified to perform high complexity clinical laboratory testing.    * >=100,000 COLONIES/mL  ESCHERICHIA COLI  C Difficile Quick Screen w PCR reflex     Status: None   Collection Time: 06/17/24  2:39 PM   Specimen: STOOL  Result Value Ref Range Status   C Diff antigen NEGATIVE NEGATIVE Final   C Diff toxin NEGATIVE NEGATIVE Final   C Diff interpretation No C. difficile detected.  Final    Comment: Performed at [facility], [address] [place], [place] [address]  Gastrointestinal Panel by PCR , Stool     Status: None   Collection Time: 06/17/24  2:39 PM   Specimen: Stool  Result Value Ref Range Status   Campylobacter species NOT DETECTED NOT DETECTED Final   Plesimonas shigelloides NOT DETECTED NOT DETECTED Final   Salmonella species NOT DETECTED NOT DETECTED Final   Yersinia enterocolitica NOT DETECTED NOT DETECTED Final   Vibrio species NOT DETECTED NOT DETECTED Final   Vibrio cholerae NOT DETECTED NOT DETECTED Final   Enteroaggregative E coli (EAEC) NOT DETECTED NOT DETECTED Final   Enteropathogenic E coli (EPEC) NOT DETECTED NOT DETECTED Final   Enterotoxigenic E coli (ETEC) NOT DETECTED NOT DETECTED Final   Shiga like toxin producing E coli (STEC) NOT DETECTED NOT DETECTED Final   Shigella/Enteroinvasive E coli (EIEC) NOT DETECTED NOT DETECTED Final   Cryptosporidium NOT DETECTED NOT DETECTED Final   Cyclospora cayetanensis NOT DETECTED NOT DETECTED Final   Entamoeba histolytica NOT DETECTED NOT DETECTED Final   Giardia lamblia NOT DETECTED NOT DETECTED Final   Adenovirus F40/41 NOT DETECTED NOT DETECTED Final   Astrovirus NOT DETECTED NOT DETECTED Final   Norovirus GI/GII NOT DETECTED NOT DETECTED Final   Rotavirus A NOT DETECTED NOT DETECTED Final   Sapovirus (I, II, IV, and V) NOT DETECTED NOT DETECTED Final    Comment: Performed at [facility], [address], [place], [place] [address]      Radiology Studies last 3 days: [facility] Chest Port 1 View Result Date: 06/18/2024 CLINICAL DATA:  Aspiration into the airway. EXAM: PORTABLE CHEST 1 VIEW  COMPARISON:  06/17/2024 FINDINGS: Patchy and linear density in the left lower lobe. The remainder of the lungs are clear with normal vascularity. Normal-sized heart. Aortic arch atheromatous calcifications. Unremarkable bones. IMPRESSION: Left lower lobe atelectasis and possible aspiration pneumonitis. Electronically Signed   By: Elspeth Bathe M.D.   On: 06/18/2024 15:17   DG Chest Portable 1 View Result Date: 06/17/2024 CLINICAL DATA:  Weakness. EXAM: PORTABLE CHEST 1 VIEW COMPARISON:  01/06/2018. FINDINGS: The heart size and mediastinal contours are within normal limits. Low lung volumes with bronchovascular crowding. No focal consolidation, sizeable pleural effusion, or pneumothorax. No acute osseous abnormality. IMPRESSION: Low lung volumes.  No acute cardiopulmonary findings. Electronically Signed  By: Harrietta Sherry M.D.   On: 06/17/2024 13:56       Time spent: 50 min     Laneta Blunt, DO [facility] 06/21/2024, 1:19 PM    Dictation software may have been used to generate the above note. Typos may occur and escape review in typed/dictated notes. Please contact Dr Blunt directly for clarity if needed.  Staff may message me via secure chat in Epic  but this may not receive an immediate response,  please page me for urgent matters!  If 7PM-7AM, please contact night coverage [email or web address]       "

## 2024-06-22 DIAGNOSIS — N390 Urinary tract infection, site not specified: Secondary | ICD-10-CM | POA: Diagnosis not present

## 2024-06-22 LAB — GLUCOSE, CAPILLARY
Glucose-Capillary: 205 mg/dL — ABNORMAL HIGH (ref 70–99)
Glucose-Capillary: 264 mg/dL — ABNORMAL HIGH (ref 70–99)
Glucose-Capillary: 258 mg/dL — ABNORMAL HIGH (ref 70–99)
Glucose-Capillary: 235 mg/dL — ABNORMAL HIGH (ref 70–99)

## 2024-06-22 NOTE — Plan of Care (Signed)

## 2024-06-22 NOTE — Plan of Care (Signed)

## 2024-06-22 NOTE — Progress Notes (Signed)
 " PROGRESS NOTE    ADHRIT Morse   FMW:[id number] DOB: 1960/08/10  DOA: 06/17/2024 Date of Service: 06/22/2024 which is hospital day 3  PCP: Troy Alyce RAMAN, MD    Hospital course / significant events:   HPI: Troy Morse is a 64 y.o. year old male with medical history of hypertension, hyperlipidemia, type 2 diabetes, history of CVA, history of PE on Eliquis  who presented to the ED with increasing weakness and somnolence.  Patient with poor functional status who has chronic foley catheter and later treatment for UTI.  His last catheter was placed 6 weeks ago. Per wife, he was supposed to have urology appointment to remove catheter but cancelled due to weakness. He denies any fevers or chills.   01/06: On arrival to the ED patient was noted to be HDS. CBC without leukocytosis, CMP with moderate hyperglycemia but.  Respiratory panel negative for COVID, flu, RSV.  UA consistent with urinary tract infection.  Urine culture pending.  Patient given 1 dose of ceftriaxone , appears last culture showed E. coli.  Admitted to hospitalist.  01/07: ongoing confusion/encephalopathic. UCx again (+)Ecoli, pending susceptibilities. Aspiration event this morning, question vomiting. NPO pending SLP eval, continue NS fluids. Foley exchanged today  01/08: much more alert / baseline. UCx (+)EColi multiple resistances, d/w ID pharmacist - plan for gentamicin dose and DC home however pt's wife requests he stays here for void trial, she would like to establish him w/ [facility] urologist instead of [facility]. D/w urology - will see pt, for now can continue w/ ceftriaxone  01/09: d/w urology, recommendation is for outpatient void trial but there is concern re transportation and mobility issues - d/w urology, will plan for void trial here Monday, will have to be Monday given limited availability of urology coverage through the weekend in case of adverse outcome. I attempted to call Mrs. Guild to relay this plan this morning at 09:50  but no answer, will attempt again later. PT/OT also to see, given concern for pt weaker than when he was DC from rehab at [facility] at which point he was ambulating.  01/10-01/11: await void trial Mon 01/12   Consultants:  Urology  Procedures/Surgeries: none      ASSESSMENT & PLAN:   Urinary Tract Infection UCX (+)Ecoli multiple resistances Altered mental status / metabolic encephalopathy d/t UTI - resolved  UTI POA associated w/ catheter which was also POA  IV Ceftriaxone , option for IV gentamicin x1 but since he will be in the hospital anyway will continue ceftriaxone  for now x7 days will finish tomorrow 01/12  Given pt's urinary retention, he has foley catheter. Plan was to follow up with urology on 06/16/24 for voiding trial but did not go d/t weakness. Foley exchanged here 01/07 Of note, pt's wife over the phone was concerned about black mold in there she notes there was small black round balls coming out the urethra around the catheter and down the groin in the ED yesterday - no noted material that matches this description though there was noted to be significant sediment/debris.   Urinary retention Of note, pt's wife Troy Morse has requested void trial here stating she does not want to take him home until he's urinating on his own.  Urology following I have advised Troy Morse that IF a void trial is attempted but he is unable to void independently then will need to re-place Foley and will plan release him from hospital to follow outpatient if he is medically stable.  Urology consult - recommendation  is for outpatient void trial but there is concern re transportation and mobility issues so will plan for void trial here Monday, will have to be Monday given limited availability of urology coverage through the weekend in case of adverse outcome. Plan remove Foley early tomorrow morning 01/12 Continue finasteride     Type 2 diabetes w/ hyperglycemia  Uncontrolled diabetes may be leading  to recurrent UTI. Needs good control of his diabetes, discussed extensively with wife and pt.  Eating better, resumed achs and basal insulin  Holding home metformin .   Continue home gabapentin  for neuropathy.   Aspiration event witnessed by RN 06/18/24 Potential aspiration pneumonitis but no symptoms Aspiration d/t encephalopathy, which has resolved Given CVA remains aspiration risk especially w/ other causes for weakness (currently, infection) Issue has resolved now that pt is more alert, he is tolerating diet  SLP eval --> regular diet IV fluids x1L for now   RUE swelling w/o tenderness or pitting edema - improving w/ elevation Consistent w/ Eliquis  - unlikely clot Suspect dependent edema pt keeps this arm tight against his side - prop up in a slightly winged out abduction using pillows on eliquis  unlikely DVT but will obtain imaging question some other sort stenosis if this isn't rsolving   Hx CVA w/ residual deficits  See [facility] admission 10/2023 Of note, PFO on echo, per neurology this was NOT contributory to CVA, see consult note 11/02/2023 Secondary prevention: Statin, eliquis , Glc control, BP control   Hx PE 11/2023 eliquis   Essential Hypertension   Not on any pharmacotherapy  History of hypotension.   continue to monitor given active infection.   Holding home midodrine 2.5 mg 3 times daily.   Hyperlipidemia/CAD:  Continue home statin.   CKD 3A: At baseline.   Renally dose medications as needed, avoid nephrotoxic agents. Monitor BMP   GAD Continue home clonazepam , SSRI    GERD:  Continue home PPI   Hx of PE: on elquis,  continue home eliquis .    Debility: 2/2 to strokes since July.  Bed bound since stroke.  need continued PT/OT as he gets home PT/OT.  PT/OT consulted. Qualifies for rehab  Of note, he was ambulatory on discharge from [facility] rehab - w/ yes/no questions I ascertained that pt has not been getting up out of med much since being back home. Suspect  worsening weakness d/t deconditioning + current UTI complicated of course by underlying CVA deficits  W/ yes/no questions, pt is amenable to rehab    Diarrhea: Appears to be chronic.  C. Diff negative.  GI panel also negative Monitor      Overweight based on BMI: Body mass index is 27.39 kg/m.SABRA Significantly low or high BMI is associated with higher medical risk.  Underweight - under 18  overweight - 25 to 29 obese - 30 or more Class 1 obesity: BMI of 30.0 to 34 Class 2 obesity: BMI of 35.0 to 39 Class 3 obesity: BMI of 40.0 to 49 Super Morbid Obesity: BMI 50-59 Super-super Morbid Obesity: BMI 60+ Healthy nutrition and physical activity advised as adjunct to other disease management and risk reduction treatments    DVT prophylaxis: eliquis  IV fluids: NS continuous IV fluids  x1L Nutrition: regular (S/p SLP eval) Central lines / other devices: Foley cath changed out today 06/18/24   Code Status: DNR ACP documentation reviewed:  none on file in [facility]  [facility] needs: likely for SNF rehab placement  Barriers to dispo: at this point awaiting foley removal and void trial Monday (  today is Friday) see above. At any rate he is likely to be here until SNF can be arranged.              Subjective / Brief ROS:  Pt alert No concerns other than some edema/swelling RUE but this seems improved     Family Communication: none at this time     Objective Findings:  Vitals:   06/21/24 1457 06/21/24 2046 06/22/24 0535 06/22/24 1038  BP: 132/76 136/66 (!) 143/85 (!) 162/85  Pulse: 63 63 71 67  Resp: 17 20 18 18   Temp: 98 F (36.7 C) 98.2 F (36.8 C) 100 F (37.8 C) 98.3 F (36.8 C)  TempSrc:   Oral   SpO2: 95% 97% 95% 96%  Weight:   89.4 kg   Height:        Intake/Output Summary (Last 24 hours) at 06/22/2024 1526 Last data filed at 06/22/2024 0500 Gross per 24 hour  Intake 180 ml  Output 1600 ml  Net -1420 ml   Filed Weights   06/19/24 0500 06/20/24 0453 06/22/24  0535  Weight: 86.7 kg 86.6 kg 89.4 kg    Examination:  Physical Exam Constitutional:      General: He is not in acute distress. Cardiovascular:     Rate and Rhythm: Normal rate and regular rhythm.  Pulmonary:     Effort: Pulmonary effort is normal.     Breath sounds: Normal breath sounds.  Musculoskeletal:        General: Swelling (RUE nonpitting, no erythema, no tenderness - appears some better today but not resolved) present.     Right lower leg: No edema.     Left lower leg: No edema.  Neurological:     Mental Status: He is alert. Mental status is at baseline.  Psychiatric:        Behavior: Behavior normal.          Scheduled Medications:   apixaban   5 mg Oral BID   aspirin  EC  81 mg Oral Daily   atorvastatin   40 mg Oral Daily   brimonidine   1 drop Both Eyes Q8H   calcium -vitamin D  1 tablet Oral Q breakfast   Chlorhexidine  Gluconate Cloth  6 each Topical Daily   clonazePAM   0.5 mg Oral BID   cyanocobalamin   1,000 mcg Oral Daily   feeding supplement (GLUCERNA SHAKE)  237 mL Oral TID BM   finasteride   5 mg Oral Daily   gabapentin   300 mg Oral BID   insulin  aspart  0-20 Units Subcutaneous TID WC   insulin  aspart  0-5 Units Subcutaneous QHS   insulin  aspart  5 Units Subcutaneous TID WC   insulin  glargine  30 Units Subcutaneous Daily   latanoprost   1 drop Both Eyes QHS   metoprolol  tartrate  25 mg Oral BID   multivitamin with minerals  1 tablet Oral Daily   pantoprazole   40 mg Oral Daily   sertraline   150 mg Oral Daily   sodium chloride  flush  3 mL Intravenous Q12H    Continuous Infusions:  cefTRIAXone  (ROCEPHIN )  IV 2 g (06/21/24 1418)   promethazine  (PHENERGAN ) injection (IM or IVPB) Stopped (06/18/24 1056)    PRN Medications:  acetaminophen  **OR** acetaminophen , bisacodyl , dextrose , hydrALAZINE , labetalol , ondansetron  **OR** ondansetron  (ZOFRAN ) IV, promethazine  (PHENERGAN ) injection (IM or IVPB)  Antimicrobials from admission:  Anti-infectives (From  admission, onward)    Start     Dose/Rate Route Frequency Ordered Stop   06/19/24 1400  cefTRIAXone  (ROCEPHIN )  2 g in sodium chloride  0.9 % 100 mL IVPB        2 g 200 mL/hr over 30 Minutes Intravenous Every 24 hours 06/19/24 0950     06/18/24 1300  cefTRIAXone  (ROCEPHIN ) 1 g in sodium chloride  0.9 % 100 mL IVPB  Status:  Discontinued        1 g 200 mL/hr over 30 Minutes Intravenous Every 24 hours 06/18/24 1154 06/19/24 0950   06/17/24 1515  cefTRIAXone  (ROCEPHIN ) 1 g in sodium chloride  0.9 % 100 mL IVPB        1 g 200 mL/hr over 30 Minutes Intravenous  Once 06/17/24 1508 06/17/24 1757           Data Reviewed:  I have personally reviewed the following...  CBC: Recent Labs  Lab 06/17/24 1405 06/18/24 0455 06/18/24 1529 06/19/24 0505 06/20/24 0545  WBC 10.1 9.2 8.6 7.8 6.9  HGB 13.0 12.9* 13.0 12.4* 11.9*  HCT 37.2* 37.4* 38.4* 36.3* 35.7*  MCV 93.7 93.3 94.6 94.0 94.9  PLT 156 153 145* 152 143*   Basic Metabolic Panel: Recent Labs  Lab 06/17/24 1405 06/17/24 1455 06/18/24 0455 06/18/24 1529 06/19/24 0505 06/20/24 0545  NA 137  --  135 135 141 140  K 4.1  --  4.1 4.5 3.5 3.6  CL 100  --  99 99 103 101  CO2 25  --  24 25 27 27   GLUCOSE 355*  --  286* 405* 84 209*  BUN 20  --  18 17 15 12   CREATININE 1.13  --  1.12 1.17 1.06 1.08  CALCIUM  10.1  --  9.8 9.8 9.8 9.4  MG  --  1.6*  --   --   --   --    GFR: Estimated Creatinine Clearance: 77.8 mL/min (by C-G formula based on SCr of 1.08 mg/dL). Liver Function Tests: Recent Labs  Lab 06/17/24 1405  AST 23  ALT 32  ALKPHOS 79  BILITOT 0.4  PROT 6.9  ALBUMIN 4.0   No results for input(s): LIPASE, AMYLASE in the last 168 hours. No results for input(s): AMMONIA in the last 168 hours. Coagulation Profile: No results for input(s): INR, PROTIME in the last 168 hours. Cardiac Enzymes: No results for input(s): CKTOTAL, CKMB, CKMBINDEX, TROPONINI in the last 168 hours. BNP (last 3 results) No  results for input(s): PROBNP in the last 8760 hours. HbA1C: No results for input(s): HGBA1C in the last 72 hours.  CBG: Recent Labs  Lab 06/21/24 1155 06/21/24 1751 06/21/24 2114 06/22/24 0911 06/22/24 1151  GLUCAP 211* 183* 173* 205* 264*   Lipid Profile: No results for input(s): CHOL, HDL, LDLCALC, TRIG, CHOLHDL, LDLDIRECT in the last 72 hours. Thyroid Function Tests: No results for input(s): TSH, T4TOTAL, FREET4, T3FREE, THYROIDAB in the last 72 hours. Anemia Panel: No results for input(s): VITAMINB12, FOLATE, FERRITIN, TIBC, IRON, RETICCTPCT in the last 72 hours. Most Recent Urinalysis On File:     Component Value Date/Time   COLORURINE YELLOW (A) 06/17/2024 1439   APPEARANCEUR CLOUDY (A) 06/17/2024 1439   LABSPEC 1.015 06/17/2024 1439   PHURINE 5.0 06/17/2024 1439   GLUCOSEU >=500 (A) 06/17/2024 1439   HGBUR LARGE (A) 06/17/2024 1439   BILIRUBINUR NEGATIVE 06/17/2024 1439   KETONESUR NEGATIVE 06/17/2024 1439   PROTEINUR 100 (A) 06/17/2024 1439   NITRITE POSITIVE (A) 06/17/2024 1439   LEUKOCYTESUR MODERATE (A) 06/17/2024 1439   Sepsis Labs: @LABRCNTIP (procalcitonin:4,lacticidven:4) Microbiology: Recent Results (from the past 240 hours)  Resp panel  by RT-PCR (RSV, Flu A&B, Covid) Anterior Nasal Swab     Status: None   Collection Time: 06/17/24  2:05 PM   Specimen: Anterior Nasal Swab  Result Value Ref Range Status   SARS Coronavirus 2 by RT PCR NEGATIVE NEGATIVE Final    Comment: (NOTE) SARS-CoV-2 target nucleic acids are NOT DETECTED.  The SARS-CoV-2 RNA is generally detectable in upper respiratory specimens during the acute phase of infection. The lowest concentration of SARS-CoV-2 viral copies this assay can detect is 138 copies/mL. A negative result does not preclude SARS-Cov-2 infection and should not be used as the sole basis for treatment or other patient management decisions. A negative result may occur with   improper specimen collection/handling, submission of specimen other than nasopharyngeal swab, presence of viral mutation(s) within the areas targeted by this assay, and inadequate number of viral copies(<138 copies/mL). A negative result must be combined with clinical observations, patient history, and epidemiological information. The expected result is Negative.  Fact Sheet for Patients:  bloggercourse.com  Fact Sheet for Healthcare Providers:  seriousbroker.it  This test is no t yet approved or cleared by the [place]  FDA and  has been authorized for detection and/or diagnosis of SARS-CoV-2 by FDA under an Emergency Use Authorization (EUA). This EUA will remain  in effect (meaning this test can be used) for the duration of the COVID-19 declaration under Section 564(b)(1) of the Act, 21 U.S.C.section 360bbb-3(b)(1), unless the authorization is terminated  or revoked sooner.       Influenza A by PCR NEGATIVE NEGATIVE Final   Influenza B by PCR NEGATIVE NEGATIVE Final    Comment: (NOTE) The Xpert Xpress SARS-CoV-2/FLU/RSV plus assay is intended as an aid in the diagnosis of influenza from Nasopharyngeal swab specimens and should not be used as a sole basis for treatment. Nasal washings and aspirates are unacceptable for Xpert Xpress SARS-CoV-2/FLU/RSV testing.  Fact Sheet for Patients: bloggercourse.com  Fact Sheet for Healthcare Providers: seriousbroker.it  This test is not yet approved or cleared by the [place]  FDA and has been authorized for detection and/or diagnosis of SARS-CoV-2 by FDA under an Emergency Use Authorization (EUA). This EUA will remain in effect (meaning this test can be used) for the duration of the COVID-19 declaration under Section 564(b)(1) of the Act, 21 U.S.C. section 360bbb-3(b)(1), unless the authorization is terminated or revoked.      Resp Syncytial Virus by PCR NEGATIVE NEGATIVE Final    Comment: (NOTE) Fact Sheet for Patients: bloggercourse.com  Fact Sheet for Healthcare Providers: seriousbroker.it  This test is not yet approved or cleared by the [place]  FDA and has been authorized for detection and/or diagnosis of SARS-CoV-2 by FDA under an Emergency Use Authorization (EUA). This EUA will remain in effect (meaning this test can be used) for the duration of the COVID-19 declaration under Section 564(b)(1) of the Act, 21 U.S.C. section 360bbb-3(b)(1), unless the authorization is terminated or revoked.  Performed at [facility], [address], [place], [place] [address]   Urine Culture     Status: Abnormal   Collection Time: 06/17/24  2:39 PM   Specimen: Urine, Catheterized  Result Value Ref Range Status   Specimen Description   Final    URINE, CATHETERIZED Performed at [facility], [address], [place], [place] [address]    Special Requests   Final    NONE Performed at [facility], [address] [place], [place] [address]    Culture >=100,000 COLONIES/mL ESCHERICHIA  COLI (A)  Final   Report Status 06/19/2024 FINAL  Final   Organism ID, Bacteria ESCHERICHIA COLI (A)  Final      Susceptibility   Escherichia coli - MIC*    AMPICILLIN >=32 RESISTANT Resistant     CEFAZOLIN  (URINE) Value in next row Resistant      >=32 RESISTANTThis is a modified FDA-approved test that has been validated and its performance characteristics determined by the reporting laboratory.  This laboratory is certified under the Clinical Laboratory Improvement Amendments CLIA as qualified to perform high complexity clinical laboratory testing.    CEFEPIME  Value in next row Sensitive      >=32 RESISTANTThis is a modified FDA-approved test that has been validated and its performance characteristics determined by the reporting laboratory.  This  laboratory is certified under the Clinical Laboratory Improvement Amendments CLIA as qualified to perform high complexity clinical laboratory testing.    ERTAPENEM Value in next row Sensitive      >=32 RESISTANTThis is a modified FDA-approved test that has been validated and its performance characteristics determined by the reporting laboratory.  This laboratory is certified under the Clinical Laboratory Improvement Amendments CLIA as qualified to perform high complexity clinical laboratory testing.    CEFTRIAXONE  Value in next row Sensitive      >=32 RESISTANTThis is a modified FDA-approved test that has been validated and its performance characteristics determined by the reporting laboratory.  This laboratory is certified under the Clinical Laboratory Improvement Amendments CLIA as qualified to perform high complexity clinical laboratory testing.    CIPROFLOXACIN Value in next row Intermediate      >=32 RESISTANTThis is a modified FDA-approved test that has been validated and its performance characteristics determined by the reporting laboratory.  This laboratory is certified under the Clinical Laboratory Improvement Amendments CLIA as qualified to perform high complexity clinical laboratory testing.    GENTAMICIN Value in next row Sensitive      >=32 RESISTANTThis is a modified FDA-approved test that has been validated and its performance characteristics determined by the reporting laboratory.  This laboratory is certified under the Clinical Laboratory Improvement Amendments CLIA as qualified to perform high complexity clinical laboratory testing.    NITROFURANTOIN Value in next row Resistant      >=32 RESISTANTThis is a modified FDA-approved test that has been validated and its performance characteristics determined by the reporting laboratory.  This laboratory is certified under the Clinical Laboratory Improvement Amendments CLIA as qualified to perform high complexity clinical laboratory testing.     TRIMETH/SULFA Value in next row Resistant      >=32 RESISTANTThis is a modified FDA-approved test that has been validated and its performance characteristics determined by the reporting laboratory.  This laboratory is certified under the Clinical Laboratory Improvement Amendments CLIA as qualified to perform high complexity clinical laboratory testing.    AMPICILLIN/SULBACTAM Value in next row Resistant      >=32 RESISTANTThis is a modified FDA-approved test that has been validated and its performance characteristics determined by the reporting laboratory.  This laboratory is certified under the Clinical Laboratory Improvement Amendments CLIA as qualified to perform high complexity clinical laboratory testing.    PIP/TAZO Value in next row Sensitive      8 SENSITIVEThis is a modified FDA-approved test that has been validated and its performance characteristics determined by the reporting laboratory.  This laboratory is certified under the Clinical Laboratory Improvement Amendments CLIA as qualified to perform high complexity clinical laboratory testing.    MEROPENEM Value  in next row Sensitive      8 SENSITIVEThis is a modified FDA-approved test that has been validated and its performance characteristics determined by the reporting laboratory.  This laboratory is certified under the Clinical Laboratory Improvement Amendments CLIA as qualified to perform high complexity clinical laboratory testing.    * >=100,000 COLONIES/mL ESCHERICHIA COLI  C Difficile Quick Screen w PCR reflex     Status: None   Collection Time: 06/17/24  2:39 PM   Specimen: STOOL  Result Value Ref Range Status   C Diff antigen NEGATIVE NEGATIVE Final   C Diff toxin NEGATIVE NEGATIVE Final   C Diff interpretation No C. difficile detected.  Final    Comment: Performed at [facility], [address] [place], [place] [address]  Gastrointestinal Panel by PCR , Stool     Status: None   Collection Time: 06/17/24  2:39 PM    Specimen: Stool  Result Value Ref Range Status   Campylobacter species NOT DETECTED NOT DETECTED Final   Plesimonas shigelloides NOT DETECTED NOT DETECTED Final   Salmonella species NOT DETECTED NOT DETECTED Final   Yersinia enterocolitica NOT DETECTED NOT DETECTED Final   Vibrio species NOT DETECTED NOT DETECTED Final   Vibrio cholerae NOT DETECTED NOT DETECTED Final   Enteroaggregative E coli (EAEC) NOT DETECTED NOT DETECTED Final   Enteropathogenic E coli (EPEC) NOT DETECTED NOT DETECTED Final   Enterotoxigenic E coli (ETEC) NOT DETECTED NOT DETECTED Final   Shiga like toxin producing E coli (STEC) NOT DETECTED NOT DETECTED Final   Shigella/Enteroinvasive E coli (EIEC) NOT DETECTED NOT DETECTED Final   Cryptosporidium NOT DETECTED NOT DETECTED Final   Cyclospora cayetanensis NOT DETECTED NOT DETECTED Final   Entamoeba histolytica NOT DETECTED NOT DETECTED Final   Giardia lamblia NOT DETECTED NOT DETECTED Final   Adenovirus F40/41 NOT DETECTED NOT DETECTED Final   Astrovirus NOT DETECTED NOT DETECTED Final   Norovirus GI/GII NOT DETECTED NOT DETECTED Final   Rotavirus A NOT DETECTED NOT DETECTED Final   Sapovirus (I, II, IV, and V) NOT DETECTED NOT DETECTED Final    Comment: Performed at [facility], [address], [place], [place] [address]      Radiology Studies last 3 days: No results found.      Time spent: 50 min     Laneta Blunt, DO [facility] 06/22/2024, 3:26 PM    Dictation software may have been used to generate the above note. Typos may occur and escape review in typed/dictated notes. Please contact Dr Blunt directly for clarity if needed.  Staff may message me via secure chat in Epic  but this may not receive an immediate response,  please page me for urgent matters!  If 7PM-7AM, please contact night coverage [email or web address]       "

## 2024-06-23 DIAGNOSIS — N390 Urinary tract infection, site not specified: Secondary | ICD-10-CM | POA: Diagnosis not present

## 2024-06-23 DIAGNOSIS — N400 Enlarged prostate without lower urinary tract symptoms: Secondary | ICD-10-CM | POA: Diagnosis not present

## 2024-06-23 DIAGNOSIS — Z87440 Personal history of urinary (tract) infections: Secondary | ICD-10-CM | POA: Diagnosis not present

## 2024-06-23 LAB — BASIC METABOLIC PANEL WITH GFR
Anion gap: 9 (ref 5–15)
BUN: 21 mg/dL (ref 8–23)
CO2: 29 mmol/L (ref 22–32)
Calcium: 10.1 mg/dL (ref 8.9–10.3)
Chloride: 98 mmol/L (ref 98–111)
Creatinine, Ser: 1.17 mg/dL (ref 0.61–1.24)
GFR, Estimated: >60 mL/min
Glucose, Bld: 232 mg/dL — ABNORMAL HIGH (ref 70–99)
Potassium: 4.5 mmol/L (ref 3.5–5.1)
Sodium: 136 mmol/L (ref 135–145)

## 2024-06-23 LAB — CBC
HCT: 36.6 % — ABNORMAL LOW (ref 39.0–52.0)
Hemoglobin: 12.4 g/dL — ABNORMAL LOW (ref 13.0–17.0)
MCH: 31.8 pg (ref 26.0–34.0)
MCHC: 33.9 g/dL (ref 30.0–36.0)
MCV: 93.8 fL (ref 80.0–100.0)
Platelets: 195 K/uL (ref 150–400)
RBC: 3.9 MIL/uL — ABNORMAL LOW (ref 4.22–5.81)
RDW: 11.7 % (ref 11.5–15.5)
WBC: 8.8 K/uL (ref 4.0–10.5)
nRBC: 0 % (ref 0.0–0.2)

## 2024-06-23 LAB — GLUCOSE, CAPILLARY
Glucose-Capillary: 253 mg/dL — ABNORMAL HIGH (ref 70–99)
Glucose-Capillary: 270 mg/dL — ABNORMAL HIGH (ref 70–99)
Glucose-Capillary: 206 mg/dL — ABNORMAL HIGH (ref 70–99)
Glucose-Capillary: 126 mg/dL — ABNORMAL HIGH (ref 70–99)
Glucose-Capillary: 145 mg/dL — ABNORMAL HIGH (ref 70–99)

## 2024-06-23 NOTE — Care Management Important Message (Signed)
 Important Message  Patient Details  Name: Troy Morse MRN: [id number] Date of Birth: 05-Oct-1960   Important Message Given:  Yes - Medicare IM     Norval Slaven W, CMA 06/23/2024, 3:08 PM

## 2024-06-23 NOTE — Inpatient Diabetes Management (Signed)
 Inpatient Diabetes Program Recommendations  AACE/ADA: New Consensus Statement on Inpatient Glycemic Control   Target Ranges:  Prepandial:   less than 140 mg/dL      Peak postprandial:   less than 180 mg/dL (1-2 hours)      Critically ill patients:  140 - 180 mg/dL    Latest Reference Range & Units 06/22/24 09:11 06/22/24 11:51 06/22/24 18:06 06/22/24 21:11 06/23/24 08:02  Glucose-Capillary 70 - 99 mg/dL 794 (H) 735 (H) 741 (H) 235 (H) 253 (H)   Review of Glycemic Control  Diabetes history: DM2 Outpatient Diabetes medications: Lantus  22-24 units daily, Humalog 5-20 units TID with meals Current orders for Inpatient glycemic control: Lantus  30 units daily, Novolog  0-20 units TID with meals, Novolog  0-5 units at bedtime, Novolog  5 units TID with meals  Inpatient Diabetes Program Recommendations:     Insulin : Please consider increasing Lantus  to 35 units daily and increasing meal coverage to Novolog  8 units TID with meals.  Thanks, Earnie Gainer, RN, MSN, CDCES Diabetes Coordinator Inpatient Diabetes Program [phone] (Team Pager from 8am to 5pm)

## 2024-06-23 NOTE — Progress Notes (Signed)
 Physical Therapy Treatment Patient Details Name: Troy Morse MRN: [id number] DOB: 1961/03/29 Today's Date: 06/23/2024   History of Present Illness Pt is a 64 y/o M admitted on 06/17/24 after presenting with c/o increasing weakness & somnolence. Pt is being treated for complicated UTI. PMH: HTN, HLD, DM2, CVA, PE, chronic renal disease    PT Comments  Pt received in bed for co-tx with OT in order to safely provide progressive mobility. Pt continues to require Total assist of 2 for supine<>sit. Static sitting EOB on air mattress, with feet proximated to floor w/ assist, requiring ModA to prevent Right and posterior LOB. Focused session on trunk strengthening, stretches and challenges moving out of midline. Pt positioned back in bed post session with bed turned in room to encourage head turn to Right. Continue acute PT per POC.    If plan is discharge home, recommend the following: Two people to help with walking and/or transfers;Two people to help with bathing/dressing/bathroom;Assistance with feeding;Supervision due to cognitive status;Direct supervision/assist for financial management;Direct supervision/assist for medications management   Can travel by private vehicle     No  Equipment Recommendations  Hospital bed;Hoyer lift    Recommendations for Other Services       Precautions / Restrictions Precautions Precautions: Fall Recall of Precautions/Restrictions: Impaired Restrictions Weight Bearing Restrictions Per Provider Order: No Other Position/Activity Restrictions:  (Right flaccid UE supported on pillows)     Mobility  Bed Mobility Overal bed mobility: Needs Assistance Bed Mobility: Supine to Sit, Sit to Supine     Supine to sit: Total assist, +2 for physical assistance Sit to supine: Total assist, +2 for physical assistance   General bed mobility comments: +2 to scoot to [facility]    Transfers                   General transfer comment: Did not attempt transfers.  Session focused on dynamic vs static seated balance, midline orientation and proximal postural control.    Ambulation/Gait               General Gait Details:  (non-ambulatory)   Stairs             Wheelchair Mobility     Tilt Bed    Modified Rankin (Stroke Patients Only)       Balance Overall balance assessment: Needs assistance Sitting-balance support: Feet supported Sitting balance-Leahy Scale: Zero Sitting balance - Comments: posterior and R lateral lean. requires ModA to maintain static sitting balance,only able to self maintain static sitting for ~3 seconds on air mattress Postural control: Right lateral lean, Posterior lean   Standing balance-Leahy Scale: Zero Standing balance comment: unable to safely attempt standing                            Communication Communication Communication: Impaired Factors Affecting Communication: Difficulty expressing self;Reduced clarity of speech  Cognition Arousal: Alert Behavior During Therapy: Flat affect   PT - Cognitive impairments: History of cognitive impairments                       PT - Cognition Comments: Answers single word responses to questions appropriately Following commands: Impaired Following commands impaired: Follows one step commands inconsistently, Follows one step commands with increased time    Cueing Cueing Techniques: Verbal cues, Tactile cues, Visual cues  Exercises General Exercises - Lower Extremity Ankle Circles/Pumps: PROM, Both, 10 reps Long Arc Quad: AAROM,  Left, Seated Heel Slides: PROM, Both, 5 reps Other Exercises Other Exercises: Pt sat at EOB for most of session focusing on midline orientation, dynamic reaching tasks, lateral trunk side bending down on elbows, and  cervical and postural correction    General Comments General comments (skin integrity, edema, etc.):  (Pt doe not like donning heel protector boots. Bed angled in room to encourage head turn  to Right.)      Pertinent Vitals/Pain Pain Assessment Pain Assessment: Faces Faces Pain Scale: Hurts a little bit Pain Location:  (back upon upright sitting and seated trunk exercises) Pain Descriptors / Indicators: Discomfort, Grimacing Pain Intervention(s): Monitored during session    Home Living                          Prior Function            PT Goals (current goals can now be found in the care plan section) Acute Rehab PT Goals Patient Stated Goal: get better    Frequency    Min 1X/week      PT Plan      Co-evaluation PT/OT/SLP Co-Evaluation/Treatment: Yes Reason for Co-Treatment: To address functional/ADL transfers;Complexity of the patient's impairments (multi-system involvement);For patient/therapist safety PT goals addressed during session: Mobility/safety with mobility;Balance;Proper use of DME;Strengthening/ROM OT goals addressed during session: ADL's and self-care      AM-PAC PT 6 Clicks Mobility   Outcome Measure  Help needed turning from your back to your side while in a flat bed without using bedrails?: Total Help needed moving from lying on your back to sitting on the side of a flat bed without using bedrails?: Total Help needed moving to and from a bed to a chair (including a wheelchair)?: Total Help needed standing up from a chair using your arms (e.g., wheelchair or bedside chair)?: Total Help needed to walk in hospital room?: Total Help needed climbing 3-5 steps with a railing? : Total 6 Click Score: 6    End of Session   Activity Tolerance: Patient tolerated treatment well Patient left: in bed;with call bell/phone within reach;with bed alarm set Nurse Communication: Mobility status PT Visit Diagnosis: Other abnormalities of gait and mobility (R26.89);Muscle weakness (generalized) (M62.81)     Time: 8981-8951 PT Time Calculation (min) (ACUTE ONLY): 30 min  Charges:    $Therapeutic Activity: 8-22 mins PT General  Charges $$ ACUTE PT VISIT: 1 Visit                    Troy Morse, PTA  Troy Morse 06/23/2024, 11:20 AM

## 2024-06-23 NOTE — Progress Notes (Addendum)
 " PROGRESS NOTE    GRABIEL SCHMUTZ   FMW:[id number] DOB: 09-Mar-1961  DOA: 06/17/2024 Date of Service: 06/23/2024 which is hospital day 4  PCP: [name], MD    Hospital course / significant events:   HPI: GLORIA LAMBERTSON is a 64 y.o. year old male with medical history of hypertension, hyperlipidemia, type 2 diabetes, history of CVA, history of PE on Eliquis  who presented to the ED with increasing weakness and somnolence.  Patient with poor functional status who has chronic foley catheter and later treatment for UTI.  His last catheter was placed 6 weeks ago. Per wife, he was supposed to have urology appointment to remove catheter but cancelled due to weakness. He denies any fevers or chills.   01/06: On arrival to the ED patient was noted to be HDS. CBC without leukocytosis, CMP with moderate hyperglycemia but.  Respiratory panel negative for COVID, flu, RSV.  UA consistent with urinary tract infection.  Urine culture pending.  Patient given 1 dose of ceftriaxone , appears last culture showed E. coli.  Admitted to hospitalist.  01/07: ongoing confusion/encephalopathic. UCx again (+)Ecoli, pending susceptibilities. Aspiration event this morning, question vomiting. NPO pending SLP eval, continue NS fluids. Foley exchanged today  01/08: much more alert / baseline. UCx (+)EColi multiple resistances, d/w ID pharmacist - plan for gentamicin dose and DC home however pt's wife requests he stays here for void trial, she would like to establish him w/ [facility] urologist instead of [facility]. D/w urology - will see pt, for now can continue w/ ceftriaxone  01/09: d/w urology, recommendation is for outpatient void trial but there is concern re transportation and mobility issues - d/w urology, will plan for void trial here Monday, will have to be Monday given limited availability of urology coverage through the weekend in case of adverse outcome. I attempted to call Mrs. Pehl to relay this plan this morning at 09:50  but no answer, will attempt again later. PT/OT also to see, given concern for pt weaker than when he was DC from rehab at [facility] at which point he was ambulating.  01/10-01/11: await void trial Mon 01/12 01/12: successful void trial today. Awaiting SNF rehab    Consultants:  Urology  Procedures/Surgeries: none      ASSESSMENT & PLAN:   Urinary Tract Infection UCX (+)Ecoli multiple resistances Altered mental status / metabolic encephalopathy d/t UTI - resolved  UTI POA associated w/ catheter which was also POA  IV Ceftriaxone , option for IV gentamicin x1 but since he will be in the hospital anyway will continue ceftriaxone  for now x7 days will finish 01/12   Urinary retention Of note, pt's wife [name] has requested void trial here stating she does not want to take him home until he's urinating on his own.  Urology following Given pt's urinary retention, he has had foley catheter. Plan was to follow up with urology on 06/16/24 for voiding trial but did not go d/t weakness. Foley exchanged here 01/07. Void trial today 06/23/2024 successful, per urology ok to dc without Foley  Continue finasteride     Type 2 diabetes w/ hyperglycemia  Uncontrolled diabetes may be leading to recurrent UTI. Needs good control of his diabetes, discussed extensively with wife and pt.  Eating better, resumed achs and basal insulin  Holding home metformin .   Continue home gabapentin  for neuropathy.   Aspiration event witnessed by RN 06/18/24 Potential aspiration pneumonitis but no symptoms Aspiration d/t encephalopathy, which has resolved Given CVA remains aspiration risk especially w/  other causes for weakness (currently, infection) Issue has resolved now that pt is more alert, he is tolerating diet  SLP eval --> regular diet IV fluids x1L for now   RUE swelling w/o tenderness or pitting edema - improving w/ elevation Consistent w/ Eliquis  - unlikely clot Suspect dependent edema pt keeps this arm  tight against his side - prop up in a slightly winged out abduction using pillows on eliquis  unlikely DVT but will obtain imaging question some other sort stenosis if this isn't rsolving   Hx CVA w/ residual deficits  See [facility] admission 10/2023 Of note, PFO on echo, per neurology this was NOT contributory to CVA, see consult note 11/02/2023 Secondary prevention: Statin, eliquis , Glc control, BP control   Hx PE 11/2023 eliquis   Essential Hypertension   Not on any pharmacotherapy  History of hypotension.   continue to monitor given active infection.   Holding home midodrine 2.5 mg 3 times daily.   Hyperlipidemia/CAD:  Continue home statin.   CKD 3A: At baseline.   Renally dose medications as needed, avoid nephrotoxic agents. Monitor BMP   GAD Continue home clonazepam , SSRI    GERD:  Continue home PPI   Hx of PE: on elquis,  continue home eliquis .    Debility: 2/2 to strokes since July.  Bed bound since stroke.  need continued PT/OT as he gets home PT/OT.  PT/OT consulted. Qualifies for rehab  Of note, he was ambulatory on discharge from [facility] rehab - w/ yes/no questions I ascertained that pt has not been getting up out of med much since being back home. Suspect worsening weakness d/t deconditioning + current UTI complicated of course by underlying CVA deficits  W/ yes/no questions, pt is amenable to rehab    Diarrhea: Appears to be chronic.  C. Diff negative.  GI panel also negative Monitor      Overweight based on BMI: Body mass index is 27.39 kg/m.SABRA Significantly low or high BMI is associated with higher medical risk.  Underweight - under 18  overweight - 25 to 29 obese - 30 or more Class 1 obesity: BMI of 30.0 to 34 Class 2 obesity: BMI of 35.0 to 39 Class 3 obesity: BMI of 40.0 to 49 Super Morbid Obesity: BMI 50-59 Super-super Morbid Obesity: BMI 60+ Healthy nutrition and physical activity advised as adjunct to other disease management and risk reduction  treatments    DVT prophylaxis: eliquis  IV fluids: NS continuous IV fluids  x1L Nutrition: regular (S/p SLP eval) Central lines / other devices: Foley cath changed out today 06/18/24   Code Status: DNR ACP documentation reviewed:  none on file in [facility]  TOC needs:  SNF rehab placement  Barriers to dispo: none             Subjective / Brief ROS:  Pt alert He denies pain Asked if he needs anything - shakes head no Asked if any pain - shaked head no    Family Communication: spoke to pt's wife 06/23/2024 5:30 PM on phone all questions answered     Objective Findings:  Vitals:   06/23/24 0354 06/23/24 0435 06/23/24 0500 06/23/24 0809  BP:  (!) 142/84  127/79  Pulse:  71  62  Resp:  16  18  Temp:  98.4 F (36.9 C)  97.8 F (36.6 C)  TempSrc:      SpO2:  95%  99%  Weight: 92.1 kg  92.5 kg   Height:  Intake/Output Summary (Last 24 hours) at 06/23/2024 1316 Last data filed at 06/23/2024 1012 Gross per 24 hour  Intake 160 ml  Output 1600 ml  Net -1440 ml   Filed Weights   06/22/24 0535 06/23/24 0354 06/23/24 0500  Weight: 89.4 kg 92.1 kg 92.5 kg    Examination:  Physical Exam Constitutional:      General: He is not in acute distress. Cardiovascular:     Rate and Rhythm: Normal rate and regular rhythm.  Pulmonary:     Effort: Pulmonary effort is normal.     Breath sounds: Normal breath sounds.  Musculoskeletal:        General: Swelling (RUE nonpitting, no erythema, no tenderness - appears some better today but not resolved) present.     Right lower leg: No edema.     Left lower leg: No edema.  Neurological:     Mental Status: He is alert. Mental status is at baseline.  Psychiatric:        Behavior: Behavior normal.          Scheduled Medications:   apixaban   5 mg Oral BID   aspirin  EC  81 mg Oral Daily   atorvastatin   40 mg Oral Daily   brimonidine   1 drop Both Eyes Q8H   calcium -vitamin D  1 tablet Oral Q breakfast    Chlorhexidine  Gluconate Cloth  6 each Topical Daily   clonazePAM   0.5 mg Oral BID   cyanocobalamin   1,000 mcg Oral Daily   feeding supplement (GLUCERNA SHAKE)  237 mL Oral TID BM   finasteride   5 mg Oral Daily   gabapentin   300 mg Oral BID   insulin  aspart  0-20 Units Subcutaneous TID WC   insulin  aspart  0-5 Units Subcutaneous QHS   insulin  aspart  5 Units Subcutaneous TID WC   insulin  glargine  30 Units Subcutaneous Daily   latanoprost   1 drop Both Eyes QHS   metoprolol  tartrate  25 mg Oral BID   multivitamin with minerals  1 tablet Oral Daily   pantoprazole   40 mg Oral Daily   sertraline   150 mg Oral Daily   sodium chloride  flush  3 mL Intravenous Q12H    Continuous Infusions:  cefTRIAXone  (ROCEPHIN )  IV 2 g (06/22/24 1604)   promethazine  (PHENERGAN ) injection (IM or IVPB) Stopped (06/18/24 1056)    PRN Medications:  acetaminophen  **OR** acetaminophen , bisacodyl , dextrose , hydrALAZINE , labetalol , ondansetron  **OR** ondansetron  (ZOFRAN ) IV, promethazine  (PHENERGAN ) injection (IM or IVPB)  Antimicrobials from admission:  Anti-infectives (From admission, onward)    Start     Dose/Rate Route Frequency Ordered Stop   06/19/24 1400  cefTRIAXone  (ROCEPHIN ) 2 g in sodium chloride  0.9 % 100 mL IVPB        2 g 200 mL/hr over 30 Minutes Intravenous Every 24 hours 06/19/24 0950     06/18/24 1300  cefTRIAXone  (ROCEPHIN ) 1 g in sodium chloride  0.9 % 100 mL IVPB  Status:  Discontinued        1 g 200 mL/hr over 30 Minutes Intravenous Every 24 hours 06/18/24 1154 06/19/24 0950   06/17/24 1515  cefTRIAXone  (ROCEPHIN ) 1 g in sodium chloride  0.9 % 100 mL IVPB        1 g 200 mL/hr over 30 Minutes Intravenous  Once 06/17/24 1508 06/17/24 1757           Data Reviewed:  I have personally reviewed the following...  CBC: Recent Labs  Lab 06/18/24 0455 06/18/24 1529 06/19/24 0505 06/20/24  9454 06/23/24 0605  WBC 9.2 8.6 7.8 6.9 8.8  HGB 12.9* 13.0 12.4* 11.9* 12.4*  HCT 37.4*  38.4* 36.3* 35.7* 36.6*  MCV 93.3 94.6 94.0 94.9 93.8  PLT 153 145* 152 143* 195   Basic Metabolic Panel: Recent Labs  Lab 06/17/24 1455 06/18/24 0455 06/18/24 1529 06/19/24 0505 06/20/24 0545 06/23/24 0605  NA  --  135 135 141 140 136  K  --  4.1 4.5 3.5 3.6 4.5  CL  --  99 99 103 101 98  CO2  --  24 25 27 27 29   GLUCOSE  --  286* 405* 84 209* 232*  BUN  --  18 17 15 12 21   CREATININE  --  1.12 1.17 1.06 1.08 1.17  CALCIUM   --  9.8 9.8 9.8 9.4 10.1  MG 1.6*  --   --   --   --   --    GFR: Estimated Creatinine Clearance: 72.9 mL/min (by C-G formula based on SCr of 1.17 mg/dL). Liver Function Tests: Recent Labs  Lab 06/17/24 1405  AST 23  ALT 32  ALKPHOS 79  BILITOT 0.4  PROT 6.9  ALBUMIN 4.0   No results for input(s): LIPASE, AMYLASE in the last 168 hours. No results for input(s): AMMONIA in the last 168 hours. Coagulation Profile: No results for input(s): INR, PROTIME in the last 168 hours. Cardiac Enzymes: No results for input(s): CKTOTAL, CKMB, CKMBINDEX, TROPONINI in the last 168 hours. BNP (last 3 results) No results for input(s): PROBNP in the last 8760 hours. HbA1C: No results for input(s): HGBA1C in the last 72 hours.  CBG: Recent Labs  Lab 06/22/24 1151 06/22/24 1806 06/22/24 2111 06/23/24 0802 06/23/24 1136  GLUCAP 264* 258* 235* 253* 270*   Lipid Profile: No results for input(s): CHOL, HDL, LDLCALC, TRIG, CHOLHDL, LDLDIRECT in the last 72 hours. Thyroid Function Tests: No results for input(s): TSH, T4TOTAL, FREET4, T3FREE, THYROIDAB in the last 72 hours. Anemia Panel: No results for input(s): VITAMINB12, FOLATE, FERRITIN, TIBC, IRON, RETICCTPCT in the last 72 hours. Most Recent Urinalysis On File:     Component Value Date/Time   COLORURINE YELLOW (A) 06/17/2024 1439   APPEARANCEUR CLOUDY (A) 06/17/2024 1439   LABSPEC 1.015 06/17/2024 1439   PHURINE 5.0 06/17/2024 1439   GLUCOSEU  >=500 (A) 06/17/2024 1439   HGBUR LARGE (A) 06/17/2024 1439   BILIRUBINUR NEGATIVE 06/17/2024 1439   KETONESUR NEGATIVE 06/17/2024 1439   PROTEINUR 100 (A) 06/17/2024 1439   NITRITE POSITIVE (A) 06/17/2024 1439   LEUKOCYTESUR MODERATE (A) 06/17/2024 1439   Sepsis Labs: @LABRCNTIP (procalcitonin:4,lacticidven:4) Microbiology: Recent Results (from the past 240 hours)  Resp panel by RT-PCR (RSV, Flu A&B, Covid) Anterior Nasal Swab     Status: None   Collection Time: 06/17/24  2:05 PM   Specimen: Anterior Nasal Swab  Result Value Ref Range Status   SARS Coronavirus 2 by RT PCR NEGATIVE NEGATIVE Final    Comment: (NOTE) SARS-CoV-2 target nucleic acids are NOT DETECTED.  The SARS-CoV-2 RNA is generally detectable in upper respiratory specimens during the acute phase of infection. The lowest concentration of SARS-CoV-2 viral copies this assay can detect is 138 copies/mL. A negative result does not preclude SARS-Cov-2 infection and should not be used as the sole basis for treatment or other patient management decisions. A negative result may occur with  improper specimen collection/handling, submission of specimen other than nasopharyngeal swab, presence of viral mutation(s) within the areas targeted by this assay, and inadequate number  of viral copies(<138 copies/mL). A negative result must be combined with clinical observations, patient history, and epidemiological information. The expected result is Negative.  Fact Sheet for Patients:  bloggercourse.com  Fact Sheet for Healthcare Providers:  seriousbroker.it  This test is no t yet approved or cleared by the [place]  FDA and  has been authorized for detection and/or diagnosis of SARS-CoV-2 by FDA under an Emergency Use Authorization (EUA). This EUA will remain  in effect (meaning this test can be used) for the duration of the COVID-19 declaration under Section 564(b)(1) of the  Act, 21 U.S.C.section 360bbb-3(b)(1), unless the authorization is terminated  or revoked sooner.       Influenza A by PCR NEGATIVE NEGATIVE Final   Influenza B by PCR NEGATIVE NEGATIVE Final    Comment: (NOTE) The Xpert Xpress SARS-CoV-2/FLU/RSV plus assay is intended as an aid in the diagnosis of influenza from Nasopharyngeal swab specimens and should not be used as a sole basis for treatment. Nasal washings and aspirates are unacceptable for Xpert Xpress SARS-CoV-2/FLU/RSV testing.  Fact Sheet for Patients: bloggercourse.com  Fact Sheet for Healthcare Providers: seriousbroker.it  This test is not yet approved or cleared by the [place]  FDA and has been authorized for detection and/or diagnosis of SARS-CoV-2 by FDA under an Emergency Use Authorization (EUA). This EUA will remain in effect (meaning this test can be used) for the duration of the COVID-19 declaration under Section 564(b)(1) of the Act, 21 U.S.C. section 360bbb-3(b)(1), unless the authorization is terminated or revoked.     Resp Syncytial Virus by PCR NEGATIVE NEGATIVE Final    Comment: (NOTE) Fact Sheet for Patients: bloggercourse.com  Fact Sheet for Healthcare Providers: seriousbroker.it  This test is not yet approved or cleared by the [place]  FDA and has been authorized for detection and/or diagnosis of SARS-CoV-2 by FDA under an Emergency Use Authorization (EUA). This EUA will remain in effect (meaning this test can be used) for the duration of the COVID-19 declaration under Section 564(b)(1) of the Act, 21 U.S.C. section 360bbb-3(b)(1), unless the authorization is terminated or revoked.  Performed at [facility], [address], [place], [place] [address]   Urine Culture     Status: Abnormal   Collection Time: 06/17/24  2:39 PM   Specimen: Urine, Catheterized  Result Value Ref  Range Status   Specimen Description   Final    URINE, CATHETERIZED Performed at [facility], [address] [place], [place] [address]    Special Requests   Final    NONE Performed at [facility], [address] [place], [place] [address]    Culture >=100,000 COLONIES/mL ESCHERICHIA COLI (A)  Final   Report Status 06/19/2024 FINAL  Final   Organism ID, Bacteria ESCHERICHIA COLI (A)  Final      Susceptibility   Escherichia coli - MIC*    AMPICILLIN >=32 RESISTANT Resistant     CEFAZOLIN  (URINE) Value in next row Resistant      >=32 RESISTANTThis is a modified FDA-approved test that has been validated and its performance characteristics determined by the reporting laboratory.  This laboratory is certified under the Clinical Laboratory Improvement Amendments CLIA as qualified to perform high complexity clinical laboratory testing.    CEFEPIME  Value in next row Sensitive      >=32 RESISTANTThis is a modified FDA-approved test that has been validated and its performance characteristics determined by the reporting laboratory.  This laboratory is certified under the Clinical Laboratory Improvement Amendments CLIA  as qualified to perform high complexity clinical laboratory testing.    ERTAPENEM Value in next row Sensitive      >=32 RESISTANTThis is a modified FDA-approved test that has been validated and its performance characteristics determined by the reporting laboratory.  This laboratory is certified under the Clinical Laboratory Improvement Amendments CLIA as qualified to perform high complexity clinical laboratory testing.    CEFTRIAXONE  Value in next row Sensitive      >=32 RESISTANTThis is a modified FDA-approved test that has been validated and its performance characteristics determined by the reporting laboratory.  This laboratory is certified under the Clinical Laboratory Improvement Amendments CLIA as qualified to perform high complexity clinical laboratory testing.     CIPROFLOXACIN Value in next row Intermediate      >=32 RESISTANTThis is a modified FDA-approved test that has been validated and its performance characteristics determined by the reporting laboratory.  This laboratory is certified under the Clinical Laboratory Improvement Amendments CLIA as qualified to perform high complexity clinical laboratory testing.    GENTAMICIN Value in next row Sensitive      >=32 RESISTANTThis is a modified FDA-approved test that has been validated and its performance characteristics determined by the reporting laboratory.  This laboratory is certified under the Clinical Laboratory Improvement Amendments CLIA as qualified to perform high complexity clinical laboratory testing.    NITROFURANTOIN Value in next row Resistant      >=32 RESISTANTThis is a modified FDA-approved test that has been validated and its performance characteristics determined by the reporting laboratory.  This laboratory is certified under the Clinical Laboratory Improvement Amendments CLIA as qualified to perform high complexity clinical laboratory testing.    TRIMETH/SULFA Value in next row Resistant      >=32 RESISTANTThis is a modified FDA-approved test that has been validated and its performance characteristics determined by the reporting laboratory.  This laboratory is certified under the Clinical Laboratory Improvement Amendments CLIA as qualified to perform high complexity clinical laboratory testing.    AMPICILLIN/SULBACTAM Value in next row Resistant      >=32 RESISTANTThis is a modified FDA-approved test that has been validated and its performance characteristics determined by the reporting laboratory.  This laboratory is certified under the Clinical Laboratory Improvement Amendments CLIA as qualified to perform high complexity clinical laboratory testing.    PIP/TAZO Value in next row Sensitive      8 SENSITIVEThis is a modified FDA-approved test that has been validated and its performance  characteristics determined by the reporting laboratory.  This laboratory is certified under the Clinical Laboratory Improvement Amendments CLIA as qualified to perform high complexity clinical laboratory testing.    MEROPENEM Value in next row Sensitive      8 SENSITIVEThis is a modified FDA-approved test that has been validated and its performance characteristics determined by the reporting laboratory.  This laboratory is certified under the Clinical Laboratory Improvement Amendments CLIA as qualified to perform high complexity clinical laboratory testing.    * >=100,000 COLONIES/mL ESCHERICHIA COLI  C Difficile Quick Screen w PCR reflex     Status: None   Collection Time: 06/17/24  2:39 PM   Specimen: STOOL  Result Value Ref Range Status   C Diff antigen NEGATIVE NEGATIVE Final   C Diff toxin NEGATIVE NEGATIVE Final   C Diff interpretation No C. difficile detected.  Final    Comment: Performed at [facility], [address], [place], [place] [address]  Gastrointestinal Panel by PCR , Stool  Status: None   Collection Time: 06/17/24  2:39 PM   Specimen: Stool  Result Value Ref Range Status   Campylobacter species NOT DETECTED NOT DETECTED Final   Plesimonas shigelloides NOT DETECTED NOT DETECTED Final   Salmonella species NOT DETECTED NOT DETECTED Final   Yersinia enterocolitica NOT DETECTED NOT DETECTED Final   Vibrio species NOT DETECTED NOT DETECTED Final   Vibrio cholerae NOT DETECTED NOT DETECTED Final   Enteroaggregative E coli (EAEC) NOT DETECTED NOT DETECTED Final   Enteropathogenic E coli (EPEC) NOT DETECTED NOT DETECTED Final   Enterotoxigenic E coli (ETEC) NOT DETECTED NOT DETECTED Final   Shiga like toxin producing E coli (STEC) NOT DETECTED NOT DETECTED Final   Shigella/Enteroinvasive E coli (EIEC) NOT DETECTED NOT DETECTED Final   Cryptosporidium NOT DETECTED NOT DETECTED Final   Cyclospora cayetanensis NOT DETECTED NOT DETECTED Final   Entamoeba histolytica  NOT DETECTED NOT DETECTED Final   Giardia lamblia NOT DETECTED NOT DETECTED Final   Adenovirus F40/41 NOT DETECTED NOT DETECTED Final   Astrovirus NOT DETECTED NOT DETECTED Final   Norovirus GI/GII NOT DETECTED NOT DETECTED Final   Rotavirus A NOT DETECTED NOT DETECTED Final   Sapovirus (I, II, IV, and V) NOT DETECTED NOT DETECTED Final    Comment: Performed at [facility], [address], [place], [place] [address]      Radiology Studies last 3 days: No results found.      Time spent: 50 min     Seona Clemenson, DO [facility] 06/23/2024, 1:16 PM    Dictation software may have been used to generate the above note. Typos may occur and escape review in typed/dictated notes. Please contact Dr Marsa directly for clarity if needed.  Staff may message me via secure chat in Epic  but this may not receive an immediate response,  please page me for urgent matters!  If 7PM-7AM, please contact night coverage [email or web address]       "

## 2024-06-23 NOTE — Progress Notes (Signed)
 Occupational Therapy Treatment Patient Details Name: Troy Morse MRN: [id number] DOB: 1961-03-14 Today's Date: 06/23/2024   History of present illness Pt is a 64 y/o M admitted on 06/17/24 after presenting with c/o increasing weakness & somnolence. Pt is being treated for complicated UTI. PMH: HTN, HLD, DM2, CVA, PE, chronic renal disease   OT comments  Troy Morse was seen for OT/PT co-treatment on this date. Upon arrival to room pt in bed, agreeable to tx. Pt requires TTOAL A x2 sup<>sit and lateral scoot along EOB. MOD A face washing in sitting using non-dominant LUE, assist for balance only. MAX A for dynamic sitting balance L/R lateral leans and anterior/posterior weight shifts, tolerated ~10 min sitting activities. Unable to progress to standing 2/2 poor sitting balance. Pt making fair progress toward goals, will continue to follow POC. Discharge recommendation remains appropriate.       If plan is discharge home, recommend the following:  Two people to help with walking and/or transfers;Two people to help with bathing/dressing/bathroom;Assist for transportation   Equipment Recommendations  Hospital bed;Hoyer lift    Recommendations for Other Services      Precautions / Restrictions Precautions Precautions: Fall Recall of Precautions/Restrictions: Impaired Restrictions Weight Bearing Restrictions Per Provider Order: No       Mobility Bed Mobility Overal bed mobility: Needs Assistance Bed Mobility: Supine to Sit, Sit to Supine     Supine to sit: Total assist, +2 for physical assistance Sit to supine: Total assist, +2 for physical assistance        Transfers Overall transfer level: Needs assistance   Transfers: Bed to chair/wheelchair/BSC            Lateral/Scoot Transfers: Total assist General transfer comment: lateral scoot along EOB     Balance Overall balance assessment: Needs assistance Sitting-balance support: Feet supported, Bilateral upper extremity  supported Sitting balance-Leahy Scale: Poor   Postural control: Posterior lean, Right lateral lean                                 ADL either performed or assessed with clinical judgement   ADL Overall ADL's : Needs assistance/impaired                                       General ADL Comments: MOD A face washing in sitting using non-dominant LUE, assist for balance. TOTAL A don/doff socks at bed level    Extremity/Trunk Assessment              Vision       Perception     Praxis     Communication Communication Communication: Impaired Factors Affecting Communication: Difficulty expressing self;Reduced clarity of speech   Cognition Arousal: Alert Behavior During Therapy: Flat affect, WFL for tasks assessed/performed Cognition: Difficult to assess Difficult to assess due to: Impaired communication           OT - Cognition Comments: aphasia                 Following commands: Impaired        Cueing   Cueing Techniques: Verbal cues, Tactile cues, Visual cues             Pertinent Vitals/ Pain       Pain Assessment Pain Assessment: PAINAD Breathing: normal Negative Vocalization: occasional moan/groan, low speech, negative/disapproving quality  Facial Expression: sad, frightened, frown Body Language: relaxed Consolability: distracted or reassured by voice/touch PAINAD Score: 3 Pain Intervention(s): Repositioned, Monitored during session  Home Living                                          Prior Functioning/Environment              Frequency  Min 2X/week        Progress Toward Goals  OT Goals(current goals can now be found in the care plan section)  Progress towards OT goals: Progressing toward goals     Plan      Co-evaluation    PT/OT/SLP Co-Evaluation/Treatment: Yes Reason for Co-Treatment: To address functional/ADL transfers;Complexity of the patient's impairments  (multi-system involvement);For patient/therapist safety PT goals addressed during session: Mobility/safety with mobility;Balance;Proper use of DME;Strengthening/ROM OT goals addressed during session: ADL's and self-care      AM-PAC OT 6 Clicks Daily Activity     Outcome Measure   Help from another person eating meals?: A Lot Help from another person taking care of personal grooming?: A Lot Help from another person toileting, which includes using toliet, bedpan, or urinal?: Total Help from another person bathing (including washing, rinsing, drying)?: Total Help from another person to put on and taking off regular upper body clothing?: A Lot Help from another person to put on and taking off regular lower body clothing?: Total 6 Click Score: 9    End of Session    OT Visit Diagnosis: Other abnormalities of gait and mobility (R26.89)   Activity Tolerance Patient tolerated treatment well   Patient Left in bed;with call bell/phone within reach   Nurse Communication          Time: 1020-1048 OT Time Calculation (min): 28 min  Charges: OT General Charges $OT Visit: 1 Visit OT Treatments $Self Care/Home Management : 8-22 mins  Elston Slot, M.S. OTR/L  06/23/2024, 11:29 AM  ascom [phone]

## 2024-06-23 NOTE — Progress Notes (Signed)
 Foley removed at 0730. Intact.

## 2024-06-23 NOTE — Plan of Care (Signed)
   Problem: Clinical Measurements: Goal: Ability to maintain clinical measurements within normal limits will improve Outcome: Progressing   Problem: Clinical Measurements: Goal: Will remain free from infection Outcome: Progressing   Problem: Clinical Measurements: Goal: Diagnostic test results will improve Outcome: Progressing

## 2024-06-23 NOTE — Progress Notes (Signed)
 Urology Inpatient Progress Note  Subjective: No acute events overnight. He is afebrile, VSS. White count WNL, 8.8.  Creatinine stable, 1.17.  On culture appropriate Rocephin  as below for MDR E. coli. Foley catheter removed by nursing this morning at 7:30 AM.  He was able to void with PVR .  Anti-infectives: Anti-infectives (From admission, onward)    Start     Dose/Rate Route Frequency Ordered Stop   06/19/24 1400  cefTRIAXone  (ROCEPHIN ) 2 g in sodium chloride  0.9 % 100 mL IVPB        2 g 200 mL/hr over 30 Minutes Intravenous Every 24 hours 06/19/24 0950     06/18/24 1300  cefTRIAXone  (ROCEPHIN ) 1 g in sodium chloride  0.9 % 100 mL IVPB  Status:  Discontinued        1 g 200 mL/hr over 30 Minutes Intravenous Every 24 hours 06/18/24 1154 06/19/24 0950   06/17/24 1515  cefTRIAXone  (ROCEPHIN ) 1 g in sodium chloride  0.9 % 100 mL IVPB        1 g 200 mL/hr over 30 Minutes Intravenous  Once 06/17/24 1508 06/17/24 1757       Current Facility-Administered Medications  Medication Dose Route Frequency Provider Last Rate Last Admin   acetaminophen  (TYLENOL ) tablet 650 mg  650 mg Oral Q6H PRN Khan, Ghalib, MD   650 mg at 06/20/24 1515   Or   acetaminophen  (TYLENOL ) suppository 650 mg  650 mg Rectal Q6H PRN Fernand Prost, MD       apixaban  (ELIQUIS ) tablet 5 mg  5 mg Oral BID Khan, Ghalib, MD   5 mg at 06/22/24 2313   aspirin  EC tablet 81 mg  81 mg Oral Daily Fernand Prost, MD   81 mg at 06/22/24 1021   atorvastatin  (LIPITOR) tablet 40 mg  40 mg Oral Daily Khan, Ghalib, MD   40 mg at 06/22/24 2313   bisacodyl  (DULCOLAX) suppository 10 mg  10 mg Rectal Daily PRN Alexander, Natalie, DO   10 mg at 06/23/24 9381   brimonidine  (ALPHAGAN ) 0.2 % ophthalmic solution 1 drop  1 drop Both Eyes Q8H Alexander, Natalie, DO   1 drop at 06/23/24 0550   calcium -vitamin D (OSCAL WITH D) 500-5 MG-MCG per tablet 1 tablet  1 tablet Oral Q breakfast Fernand Prost, MD   1 tablet at 06/23/24 0839   cefTRIAXone   (ROCEPHIN ) 2 g in sodium chloride  0.9 % 100 mL IVPB  2 g Intravenous Q24H Zeigler, Dustin G, RPH 200 mL/hr at 06/22/24 1604 2 g at 06/22/24 1604   Chlorhexidine  Gluconate Cloth 2 % PADS 6 each  6 each Topical Daily Alexander, Natalie, DO   6 each at 06/22/24 1320   clonazePAM  (KLONOPIN ) tablet 0.5 mg  0.5 mg Oral BID Alexander, Natalie, DO   0.5 mg at 06/22/24 2314   cyanocobalamin  (VITAMIN B12) tablet 1,000 mcg  1,000 mcg Oral Daily Khan, Ghalib, MD   1,000 mcg at 06/22/24 1020   dextrose  50 % solution 50 mL  1 ampule Intravenous PRN Marsa Edelman, DO       feeding supplement (GLUCERNA SHAKE) (GLUCERNA SHAKE) liquid 237 mL  237 mL Oral TID BM Alexander, Natalie, DO   237 mL at 06/22/24 1600   finasteride  (PROSCAR ) tablet 5 mg  5 mg Oral Daily Khan, Ghalib, MD   5 mg at 06/22/24 1021   gabapentin  (NEURONTIN ) capsule 300 mg  300 mg Oral BID Alexander, Natalie, DO   300 mg at 06/22/24 2313   hydrALAZINE  (APRESOLINE ) injection  10 mg  10 mg Intravenous Q6H PRN Alexander, Natalie, DO       insulin  aspart (novoLOG ) injection 0-20 Units  0-20 Units Subcutaneous TID WC Alexander, Natalie, DO   11 Units at 06/23/24 9161   insulin  aspart (novoLOG ) injection 0-5 Units  0-5 Units Subcutaneous QHS Alexander, Natalie, DO   2 Units at 06/22/24 2306   insulin  aspart (novoLOG ) injection 5 Units  5 Units Subcutaneous TID WC Alexander, Natalie, DO   5 Units at 06/23/24 9160   insulin  glargine (LANTUS ) injection 30 Units  30 Units Subcutaneous Daily Marsa Edelman, DO   30 Units at 06/22/24 2312   labetalol  (NORMODYNE ) injection 10 mg  10 mg Intravenous Q2H PRN Fernand Prost, MD       latanoprost  (XALATAN ) 0.005 % ophthalmic solution 1 drop  1 drop Both Eyes QHS Fernand Prost, MD   1 drop at 06/22/24 2316   metoprolol  tartrate (LOPRESSOR ) tablet 25 mg  25 mg Oral BID Alexander, Natalie, DO   25 mg at 06/22/24 2313   multivitamin with minerals tablet 1 tablet  1 tablet Oral Daily Alexander, Natalie, DO   1 tablet  at 06/22/24 1021   ondansetron  (ZOFRAN ) tablet 4 mg  4 mg Oral Q6H PRN Fernand Prost, MD       Or   ondansetron  (ZOFRAN ) injection 4 mg  4 mg Intravenous Q6H PRN Khan, Ghalib, MD   4 mg at 06/18/24 9092   pantoprazole  (PROTONIX ) EC tablet 40 mg  40 mg Oral Daily Khan, Ghalib, MD   40 mg at 06/22/24 1020   promethazine  (PHENERGAN ) 12.5 mg in sodium chloride  0.9 % 50 mL IVPB  12.5 mg Intravenous Q6H PRN Alexander, Natalie, DO   Stopped at 06/18/24 1056   sertraline  (ZOLOFT ) tablet 150 mg  150 mg Oral Daily Fernand Prost, MD   150 mg at 06/22/24 1020   sodium chloride  flush (NS) 0.9 % injection 3 mL  3 mL Intravenous Q12H Fernand Prost, MD   3 mL at 06/22/24 2318     Objective: Vital signs in last 24 hours: Temp:  [97.8 F (36.6 C)-98.4 F (36.9 C)] 97.8 F (36.6 C) (01/12 0809) Pulse Rate:  [62-71] 62 (01/12 0809) Resp:  [16-18] 18 (01/12 0809) BP: (127-162)/(79-85) 127/79 (01/12 0809) SpO2:  [95 %-99 %] 99 % (01/12 0809) Weight:  [92.1 kg-92.5 kg] 92.5 kg (01/12 0500)  Intake/Output from previous day: 01/11 0701 - 01/12 0700 In: -  Out: 1600 [Urine:1600] Intake/Output this shift: No intake/output data recorded.  Physical Exam Vitals and nursing note reviewed.  Constitutional:      General: He is not in acute distress.    Appearance: He is not ill-appearing, toxic-appearing or diaphoretic.  HENT:     Head: Normocephalic and atraumatic.  Pulmonary:     Effort: Pulmonary effort is normal. No respiratory distress.  Skin:    General: Skin is warm and dry.  Neurological:     Mental Status: He is alert.  Psychiatric:        Mood and Affect: Mood normal.        Behavior: Behavior normal.    Lab Results:  Recent Labs    06/23/24 0605  WBC 8.8  HGB 12.4*  HCT 36.6*  PLT 195   BMET Recent Labs    06/23/24 0605  NA 136  K 4.5  CL 98  CO2 29  GLUCOSE 232*  BUN 21  CREATININE 1.17  CALCIUM  10.1   Assessment & Plan:  64 y.o. male with PMH multiple CVA, DM2, PE on  Eliquis , chronic urinary retention who has failed multiple voiding trials, possible BPH, and recurrent UTI now admitted for management of complicated UTI.  Voiding trial passed.  I am comfortable with discharge without Foley catheter.  I would ideally like him to follow-up in clinic in 2 to 4 weeks for repeat PVR, though understand transportation to clinic may be challenging.  I will continue to work with his primary team to schedule this pending dispo planning.  Gavan Nordby, PA-C 06/23/2024

## 2024-06-23 NOTE — TOC Progression Note (Signed)
 Transition of Care [facility]) - Progression Note    Patient Details  Name: ISSIAH Morse MRN: [id number] Date of Birth: [birth year]  [facility] of [facility] CM/SW Contact  Alvaro Louder, [place] Phone Number: 06/23/2024, 1:10 PM  Clinical Narrative:     Auth Pending for patient to admit to SNF [facility].   Pending [id number]   TOC to follow for discharge    Expected Discharge Plan: Home w Home Health Services Barriers to Discharge: Continued Medical Work up               Expected Discharge Plan and Services   Discharge Planning Services: CM Consult Post Acute Care Choice: Durable Medical Equipment, Home Health Living arrangements for the past 2 months: Single Family Home                 DME Arranged: Hospital bed, Other see comment [facility] Lift) DME Agency: [employer]                   Social Drivers of Health (SDOH) Interventions SDOH Screenings   Food Insecurity: No Food Insecurity (06/17/2024)  Housing: Low Risk (06/17/2024)  Transportation Needs: No Transportation Needs (06/17/2024)  Utilities: Not At Risk (06/17/2024)  Financial Resource Strain: Low Risk  (04/29/2024)   [facility] from [facility]  Tobacco Use: Low Risk (06/17/2024)  Health Literacy: Adequate Health Literacy (05/15/2024)   Received from [facility]    Readmission Risk Interventions     No data to display

## 2024-06-23 NOTE — Plan of Care (Signed)
  Problem: Clinical Measurements: Goal: Will remain free from infection Outcome: Progressing   Problem: Clinical Measurements: Goal: Diagnostic test results will improve Outcome: Progressing   Problem: Nutrition: Goal: Adequate nutrition will be maintained Outcome: Progressing   

## 2024-06-24 DIAGNOSIS — N390 Urinary tract infection, site not specified: Secondary | ICD-10-CM | POA: Diagnosis not present

## 2024-06-24 LAB — GLUCOSE, CAPILLARY
Glucose-Capillary: 189 mg/dL — ABNORMAL HIGH (ref 70–99)
Glucose-Capillary: 182 mg/dL — ABNORMAL HIGH (ref 70–99)
Glucose-Capillary: 254 mg/dL — ABNORMAL HIGH (ref 70–99)
Glucose-Capillary: 272 mg/dL — ABNORMAL HIGH (ref 70–99)

## 2024-06-24 MED ORDER — INSULIN GLARGINE 100 UNIT/ML ~~LOC~~ SOLN
35.0000 [IU] | Freq: Every day | SUBCUTANEOUS | Status: DC
  Administered 2024-06-24 – 2024-06-26 (×3): 35 [IU] via SUBCUTANEOUS
  Filled 2024-06-24 (×4): qty 0.35

## 2024-06-24 NOTE — TOC Progression Note (Signed)
 Transition of Care [facility]) - Progression Note    Patient Details  Name: Troy Morse MRN: [id number] Date of Birth: 1961-04-13  Transition of Care [facility]) CM/SW Contact  Alvaro Louder, [place] Phone Number: 06/24/2024, 1:27 PM  Clinical Narrative:   Insurance auth for SNF has been denied. LCSWA informed spouse over the phone. She indicated that she does not agree with this decision and is going to appeal the decision. LCSWA gave her the fast appeal number [phone]). She indicated that she is going to call them today to start the process.  LCSWA arranged DME Hospital bed and [facility] lift for the patient at DC if patient is denied again. [employer] Rep informed LCSWA that spouse canceled hospital bed and opted to only take the [facility] lift.   TOC to follow for discharge    Expected Discharge Plan: Home w Home Health Services Barriers to Discharge: Continued Medical Work up               Expected Discharge Plan and Services   Discharge Planning Services: CM Consult Post Acute Care Choice: Durable Medical Equipment, Home Health Living arrangements for the past 2 months: Single Family Home                 DME Arranged: Hospital bed, Other see comment Lajean Lift) DME Agency: [employer]                   Social Drivers of Health (SDOH) Interventions SDOH Screenings   Food Insecurity: No Food Insecurity (06/17/2024)  Housing: Low Risk (06/17/2024)  Transportation Needs: No Transportation Needs (06/17/2024)  Utilities: Not At Risk (06/17/2024)  Financial Resource Strain: Low Risk  (04/29/2024)   [facility]  Tobacco Use: Low Risk (06/17/2024)  Health Literacy: Adequate Health Literacy (05/15/2024)   Received [facility]    Readmission Risk Interventions     No data to display

## 2024-06-24 NOTE — Progress Notes (Signed)
 Physical Therapy Treatment Patient Details Name: Troy Morse MRN: [id number] DOB: 07-14-1960 Today's Date: 06/24/2024   History of Present Illness Pt is a 64 y/o M admitted on 06/17/24 after presenting with c/o increasing weakness & somnolence. Pt is being treated for complicated UTI. PMH: HTN, HLD, DM2, CVA, PE, chronic renal disease    PT Comments  Patient received in bed, on left side. He is alert, slow to respond. Agrees to PT session. He requires total +2 assist to perform supine to sit. Demonstrates poor initial sitting balance, however is able to progress to min A and briefly supervision. He does require assistance positioning LEs properly on floor to promote improved balance. Requires cues for trunk positioning/activation. Patient noted to have increased tone/stiffness in R UE and B LEs, reporting pain with passive movement. He will continue to benefit from skilled PT to improve strength, balance and functional independence to reduce caregiver burden.       If plan is discharge home, recommend the following: Two people to help with walking and/or transfers;Two people to help with bathing/dressing/bathroom   Can travel by private vehicle     No  Equipment Recommendations  Hospital bed;Hoyer lift    Recommendations for Other Services       Precautions / Restrictions Precautions Precautions: Fall Recall of Precautions/Restrictions: Impaired Restrictions Weight Bearing Restrictions Per Provider Order: No     Mobility  Bed Mobility Overal bed mobility: Needs Assistance Bed Mobility: Supine to Sit, Sit to Supine     Supine to sit: Total assist, +2 for physical assistance, HOB elevated Sit to supine: Max assist, +2 for physical assistance   General bed mobility comments: Patient requires +2 for bed mobility.  Stiffness noted in B LEs and RUE. Minimal use of L UE. Patient with heavy posterior leaning and right leaning with initial sitting on edge of bed. Requires assistance to  position LEs on floor for improved balance.    Transfers                   General transfer comment: not attempted this session due to poor sitting balance and fatigue    Ambulation/Gait               General Gait Details: unable at baseline   Stairs             Wheelchair Mobility     Tilt Bed    Modified Rankin (Stroke Patients Only)       Balance Overall balance assessment: Needs assistance Sitting-balance support: Feet supported Sitting balance-Leahy Scale: Poor Sitting balance - Comments: patient varying assistance from mod/max to supervision for brief time sitting edge of bed. Requires positioning assistance to maximize balance. Postural control: Posterior lean, Right lateral lean                                  Communication Communication Communication: Impaired Factors Affecting Communication: Difficulty expressing self;Reduced clarity of speech  Cognition Arousal: Alert Behavior During Therapy: Flat affect, WFL for tasks assessed/performed   PT - Cognitive impairments: History of cognitive impairments                       PT - Cognition Comments: Answers single word responses to questions appropriately, increased time needed to respond Following commands: Impaired Following commands impaired: Follows one step commands inconsistently, Follows one step commands with increased time  Cueing Cueing Techniques: Verbal cues, Gestural cues, Tactile cues  Exercises Other Exercises Other Exercises: Passive ROM of L UE in flexion, abd/add. Passive ROM of B LEs in knee/hip flexion and hip abd/add due to increased tightness/tone and pain with movement.    General Comments        Pertinent Vitals/Pain Pain Assessment Pain Assessment: Faces Faces Pain Scale: Hurts little more Pain Location: R UE and R LE with movement/positioning Pain Descriptors / Indicators: Discomfort, Grimacing, Moaning Pain Intervention(s):  Monitored during session, Repositioned    Home Living                          Prior Function            PT Goals (current goals can now be found in the care plan section) Acute Rehab PT Goals Patient Stated Goal: get better PT Goal Formulation: With patient/family Time For Goal Achievement: 07/02/24 Potential to Achieve Goals: Fair Progress towards PT goals: Progressing toward goals    Frequency    Min 2X/week      PT Plan      Co-evaluation              AM-PAC PT 6 Clicks Mobility   Outcome Measure  Help needed turning from your back to your side while in a flat bed without using bedrails?: Total Help needed moving from lying on your back to sitting on the side of a flat bed without using bedrails?: Total Help needed moving to and from a bed to a chair (including a wheelchair)?: Total Help needed standing up from a chair using your arms (e.g., wheelchair or bedside chair)?: Total Help needed to walk in hospital room?: Total Help needed climbing 3-5 steps with a railing? : Total 6 Click Score: 6    End of Session   Activity Tolerance: Patient tolerated treatment well;Patient limited by fatigue Patient left: in bed;with call bell/phone within reach;with nursing/sitter in room Nurse Communication: Mobility status PT Visit Diagnosis: Other abnormalities of gait and mobility (R26.89);Muscle weakness (generalized) (M62.81);Pain Pain - part of body:  (extremities)     Time: 1430-1453 PT Time Calculation (min) (ACUTE ONLY): 23 min  Charges:    $Therapeutic Exercise: 8-22 mins $Therapeutic Activity: 8-22 mins PT General Charges $$ ACUTE PT VISIT: 1 Visit                     Mikalia Fessel, PT, GCS 06/24/2024,3:12 PM

## 2024-06-24 NOTE — Progress Notes (Signed)
 Nutrition Follow-up  DOCUMENTATION CODES:   Not applicable  INTERVENTION:   -Continue Glucerna Shake po TID, each supplement provides 220 kcal and 10 grams of protein  -Continue MVI with minerals daily -Continue feeding assistance with meals  -Continue Magic cup TID with meals, each supplement provides 290 kcal and 9 grams of protein   NUTRITION DIAGNOSIS:   Increased nutrient needs related to acute illness as evidenced by estimated needs.  Ongoing  GOAL:   Patient will meet greater than or equal to 90% of their needs  Progressing   MONITOR:   PO intake, Supplement acceptance  REASON FOR ASSESSMENT:   Consult Assessment of nutrition requirement/status, Calorie Count, Poor PO  ASSESSMENT:   64 y.o. year old male with medical history of hypertension, hyperlipidemia, type 2 diabetes, history of CVA, history of PE on Eliquis  who presented with increasing weakness and somnolence.  Patient with poor functional status who has chronic foley catheter and later treatment for UTI.  His last catheter was placed 6 weeks ago. Per wife, he was supposed to have urology appointment to remove catheter but cancelled due to weakness. He denies any fevers or chills.  1/7- s/p BSE- regular diet with thin liquids   Reviewed I/O's: +160 ml x 24 hours and -3 L since admission    Patient lying in bed at time of visit. No family at bedside. Patient did not arouse to voice or touch.   Patient remains on a regular diet. Wife has been calling ahead for meal selections. Noted meal completions 50-100%. Breakfast tray at bedside, just delivered.   Reviewed weights; weight has ranged from 83.1-93.9 kg since admission. No weight loss noted.   Per TOC notes, plan to discharge to SNF ([facility]) once insurance authorization has been obtained.   Medications reviewed and include calcium  with vitamin D, klonopin , vitamin B-12, neurontin , lopressor , and protonix .  Labs reviewed: CBGS: 126-270 (inpatient  orders for glycemic control are 0-5 units insulin  aspart daily at bedtime, 0-20 units insulin  aspart TID with meals, 5 units insulin  aspart TID with meals, and 30 units insulin  glargine daily). DM coordinator following and recommending increasing Lantus  to 35 units daily.   Diet Order:   Diet Order             Diet regular Room service appropriate? Yes with Assist; Fluid consistency: Thin  Diet effective now                   EDUCATION NEEDS:   Not appropriate for education at this time  Skin:  Skin Assessment: Reviewed RN Assessment  Last BM:  06/23/24 (type 5)  Height:   Ht Readings from Last 1 Encounters:  06/17/24 5' 10 (1.778 m)    Weight:   Wt Readings from Last 1 Encounters:  06/24/24 93.9 kg    Ideal Body Weight:  75.5 kg  BMI:  Body mass index is 29.7 kg/m.  Estimated Nutritional Needs:   Kcal:  1950-2150  Protein:  100-115 grams  Fluid:  1.9-2.1 L    Margery ORN, RD, LDN, CDCES Registered Dietitian III Certified Diabetes Care and Education Specialist If unable to reach this RD, please use RD Inpatient group chat on secure chat between hours of 8am-4 pm daily

## 2024-06-24 NOTE — TOC Progression Note (Signed)
 Transition of Care [facility]) - Progression Note    Patient Details  Name: Troy Morse MRN: [id number] Date of Birth: [birth year]  Transition of Care [facility]) CM/SW Contact  Alvaro Louder, [place] Phone Number: 06/24/2024, 4:27 PM  Clinical Narrative:   LCSWA met with the patient and spouse at the bedside. While at the bedside the spouse called and spoke with [employer] and requested a expedited appeal for patient to be admitted to Skilled nursing. LCSWA confirmed with insurance that the expedited appeal takes up to 72 hours to receive documentation. LCSWA requested that documents be faxed to [facility] office.   TOC to follow for discharge.    Expected Discharge Plan: Home w Home Health Services Barriers to Discharge: Continued Medical Work up               Expected Discharge Plan and Services   Discharge Planning Services: CM Consult Post Acute Care Choice: Durable Medical Equipment, Home Health Living arrangements for the past 2 months: Single Family Home                 DME Arranged: Hospital bed, Other see comment [facility]ift) DME Agency: [employer]                   Social Drivers of Health (SDOH) Interventions SDOH Screenings   Food Insecurity: No Food Insecurity (06/17/2024)  Housing: Low Risk (06/17/2024)  Transportation Needs: No Transportation Needs (06/17/2024)  Utilities: Not At Risk (06/17/2024)  Financial Resource Strain: Low Risk  (04/29/2024)   Received from [facility]  Tobacco Use: Low Risk (06/17/2024)  Health Literacy: Adequate Health Literacy (05/15/2024)   Received from [facility]    Readmission Risk Interventions     No data to display

## 2024-06-24 NOTE — Plan of Care (Signed)

## 2024-06-24 NOTE — Inpatient Diabetes Management (Signed)
 Inpatient Diabetes Program Recommendations  AACE/ADA: New Consensus Statement on Inpatient Glycemic Control   Target Ranges:  Prepandial:   less than 140 mg/dL      Peak postprandial:   less than 180 mg/dL (1-2 hours)      Critically ill patients:  140 - 180 mg/dL   Review of Glycemic Control  Latest Reference Range & Units 06/23/24 08:02 06/23/24 11:36 06/23/24 16:14 06/23/24 21:31 06/23/24 21:56 06/24/24 07:45  Glucose-Capillary 70 - 99 mg/dL 746 (H) 729 (H) 793 (H) 145 (H) 126 (H) 189 (H)   Diabetes history: DM2 Outpatient Diabetes medications: Lantus  22-24 units daily, Humalog 5-20 units TID with meals Current orders for Inpatient glycemic control:  Lantus  30 units daily, Novolog  0-20 units TID with meals, Novolog  0-5 units at bedtime, Novolog  5 units TID with meals  Glucerna tid between meals  Inpatient Diabetes Program Recommendations:     Insulin : Please consider increasing Lantus  to 35 units daily   Thanks, Clotilda Bull RN, MSN, BC-ADM Inpatient Diabetes Coordinator Team Pager [phone] (8a-5p)

## 2024-06-24 NOTE — Progress Notes (Addendum)
 " PROGRESS NOTE    Troy Morse   FMW:[id number] DOB: September 17, 1960  DOA: 06/17/2024 Date of Service: 06/24/2024 which is hospital day 5  PCP: [name], MD    Hospital course / significant events:   HPI: Troy Morse is a 64 y.o. year old male with medical history of hypertension, hyperlipidemia, type 2 diabetes, history of CVA, history of PE on Eliquis  who presented to the ED with increasing weakness and somnolence.  Patient with poor functional status who has chronic foley catheter and later treatment for UTI.  His last catheter was placed 6 weeks ago. Per wife, he was supposed to have urology appointment to remove catheter but cancelled due to weakness. He denies any fevers or chills.   01/06: On arrival to the ED patient was noted to be HDS. CBC without leukocytosis, CMP with moderate hyperglycemia but.  Respiratory panel negative for COVID, flu, RSV.  UA consistent with urinary tract infection.  Urine culture pending.  Patient given 1 dose of ceftriaxone , appears last culture showed E. coli.  Admitted to hospitalist.  01/07: ongoing confusion/encephalopathic. UCx again (+)Ecoli, pending susceptibilities. Aspiration event this morning, question vomiting. NPO pending SLP eval, continue NS fluids. Foley exchanged today  01/08: much more alert / baseline. UCx (+)EColi multiple resistances, d/w ID pharmacist - plan for gentamicin dose and DC home however pt's wife requests he stays here for void trial, she would like to establish him w/ [facility] urologist instead of [facility]. D/w urology - will see pt, for now can continue w/ ceftriaxone  01/09: d/w urology, recommendation is for outpatient void trial but there is concern re transportation and mobility issues - d/w urology, will plan for void trial here Monday, will have to be Monday given limited availability of urology coverage through the weekend in case of adverse outcome. I attempted to call Troy Morse to relay this plan this morning at 09:50  but no answer, will attempt again later. PT/OT also to see, given concern for pt weaker than when he was DC from rehab at [facility] at which point he was ambulating.  01/10-01/11: await void trial Mon 01/12 01/12: successful void trial today. Awaiting SNF rehab - needs peer-to-peer 01/13: peer-to-peer completed this morning, I spoke w/ a Dr. Segel. Rehab was denied - stated reason being that the pt appears to be at his baseline now bed-bound which is same as his level of function prior to developing the UTI / prior to the reason for this hospitalization. Pt's wife is appealing the denial. PT/OT to reassess today / continue work w/ patient as able.    Consultants:  Urology  Procedures/Surgeries: none      ASSESSMENT & PLAN:   Urinary Tract Infection UCX (+)Ecoli multiple resistances Altered mental status / metabolic encephalopathy d/t UTI - resolved  UTI POA associated w/ catheter which was also POA  IV Ceftriaxone , option for IV gentamicin x1 but since he will be in the hospital anyway will continue ceftriaxone  x7 days - finish 01/12   Urinary retention - improved/resolved  Urology following Given pt's urinary retention after previous hospitalization, he has had foley catheter. Plan was to follow up with urology on 06/16/24 for voiding trial but did not go d/t weakness. Foley exchanged here 01/07 beginning of this hospital stay. Void trial 06/23/2024 successful, per urology ok to discharge without Foley  Continue finasteride     Type 2 diabetes w/ hyperglycemia  Uncontrolled diabetes may be leading to recurrent UTI.  Eating better, resumed achs  and basal insulin  Holding home metformin .   Continue home gabapentin  for neuropathy.   Aspiration event witnessed by RN 06/18/24 Potential aspiration pneumonitis but no symptoms Aspiration d/t encephalopathy, which has resolved Given CVA remains aspiration risk especially w/ other causes for weakness (currently, infection) Issue has resolved  now that pt is more alert, he is tolerating diet  SLP eval --> regular diet  RUE swelling w/o tenderness or pitting edema - improving w/ elevation Consistent w/ Eliquis  - unlikely clot Suspect dependent edema pt keeps this arm tight against his side, also IV infiltrate - prop up in a slightly winged out abduction using pillows on eliquis  unlikely DVT and edema is slowly improving  Hx CVA w/ residual deficits  See [facility] admission 10/2023 Of note, PFO on echo, per neurology this was NOT contributory to CVA, see consult note 11/02/2023 Secondary prevention: Statin, eliquis , Glc control, BP control   Hx PE 11/2023 eliquis   Essential Hypertension   Not on any pharmacotherapy  History of hypotension.   continue to monitor given active infection.   Holding home midodrine and BP has been WNL   Hyperlipidemia/CAD:  Continue home statin.   CKD 3A: At baseline.   Renally dose medications as needed, avoid nephrotoxic agents. Monitor BMP   GAD Continue home clonazepam , SSRI    GERD:  Continue home PPI   Hx of PE: on elquis,  continue home eliquis .    Debility: 2/2 to strokes since July.  Bed bound prior to admission here.  need continued PT/OT  he gets home PT/OT Insurance has denied rehab - see hospital course  Of note, he was ambulatory w. [facility] on discharge from [facility] rehab 04/2024   Diarrhea: Appears to be chronic.  C. Diff negative.  GI panel also negative Monitor      Overweight based on BMI: Body mass index is 27.39 kg/m.SABRA Significantly low or high BMI is associated with higher medical risk.  Underweight - under 18  overweight - 25 to 29 obese - 30 or more Class 1 obesity: BMI of 30.0 to 34 Class 2 obesity: BMI of 35.0 to 39 Class 3 obesity: BMI of 40.0 to 49 Super Morbid Obesity: BMI 50-59 Super-super Morbid Obesity: BMI 60+ Healthy nutrition and physical activity advised as adjunct to other disease management and risk reduction treatments    DVT  prophylaxis: eliquis  IV fluids: none Nutrition: regular (S/p SLP eval) Central lines / other devices: none  Code Status: DNR ACP documentation reviewed:  none on file in [facility]  TOC needs:  SNF rehab placement / home health  Medical barriers to dispo: none             Subjective / Brief ROS:  Pt alert He denies pain Asked if he needs anything - shakes head no Asked if any pain - shaked head no    Family Communication: call to wife [name] 06/24/2024 5:01 PM     Objective Findings:  Vitals:   06/23/24 2035 06/24/24 0345 06/24/24 0500 06/24/24 0852  BP: 121/81 120/68  122/69  Pulse: (!) 59 60  (!) 56  Resp: 16 18  18   Temp: 97.8 F (36.6 C) 98.9 F (37.2 C)  98.9 F (37.2 C)  TempSrc: Oral     SpO2: 98% 95%  95%  Weight:   93.9 kg   Height:       No intake or output data in the 24 hours ending 06/24/24 1408  Filed Weights   06/23/24 0354 06/23/24 0500  06/24/24 0500  Weight: 92.1 kg 92.5 kg 93.9 kg    Examination:  Physical Exam Constitutional:      General: He is not in acute distress. Cardiovascular:     Rate and Rhythm: Normal rate and regular rhythm.  Pulmonary:     Effort: Pulmonary effort is normal.     Breath sounds: Normal breath sounds.  Musculoskeletal:        General: Swelling (RUE nonpitting, no erythema, no tenderness - appears some better today but not resolved - edema is limited to more distal today) present.     Right lower leg: No edema.     Left lower leg: No edema.  Neurological:     Mental Status: He is alert. Mental status is at baseline.  Psychiatric:        Behavior: Behavior normal.          Scheduled Medications:   apixaban   5 mg Oral BID   aspirin  EC  81 mg Oral Daily   atorvastatin   40 mg Oral Daily   brimonidine   1 drop Both Eyes Q8H   calcium -vitamin D  1 tablet Oral Q breakfast   Chlorhexidine  Gluconate Cloth  6 each Topical Daily   clonazePAM   0.5 mg Oral BID   cyanocobalamin   1,000 mcg Oral  Daily   feeding supplement (GLUCERNA SHAKE)  237 mL Oral TID BM   finasteride   5 mg Oral Daily   gabapentin   300 mg Oral BID   insulin  aspart  0-20 Units Subcutaneous TID WC   insulin  aspart  0-5 Units Subcutaneous QHS   insulin  aspart  5 Units Subcutaneous TID WC   insulin  glargine  30 Units Subcutaneous Daily   latanoprost   1 drop Both Eyes QHS   metoprolol  tartrate  25 mg Oral BID   multivitamin with minerals  1 tablet Oral Daily   pantoprazole   40 mg Oral Daily   sertraline   150 mg Oral Daily   sodium chloride  flush  3 mL Intravenous Q12H    Continuous Infusions:  promethazine  (PHENERGAN ) injection (IM or IVPB) Stopped (06/18/24 1056)    PRN Medications:  acetaminophen  **OR** acetaminophen , bisacodyl , dextrose , hydrALAZINE , labetalol , ondansetron  **OR** ondansetron  (ZOFRAN ) IV, promethazine  (PHENERGAN ) injection (IM or IVPB)  Antimicrobials from admission:  Anti-infectives (From admission, onward)    Start     Dose/Rate Route Frequency Ordered Stop   06/19/24 1400  cefTRIAXone  (ROCEPHIN ) 2 g in sodium chloride  0.9 % 100 mL IVPB  Status:  Discontinued        2 g 200 mL/hr over 30 Minutes Intravenous Every 24 hours 06/19/24 0950 06/24/24 1112   06/18/24 1300  cefTRIAXone  (ROCEPHIN ) 1 g in sodium chloride  0.9 % 100 mL IVPB  Status:  Discontinued        1 g 200 mL/hr over 30 Minutes Intravenous Every 24 hours 06/18/24 1154 06/19/24 0950   06/17/24 1515  cefTRIAXone  (ROCEPHIN ) 1 g in sodium chloride  0.9 % 100 mL IVPB        1 g 200 mL/hr over 30 Minutes Intravenous  Once 06/17/24 1508 06/17/24 1757           Data Reviewed:  I have personally reviewed the following...  CBC: Recent Labs  Lab 06/18/24 0455 06/18/24 1529 06/19/24 0505 06/20/24 0545 06/23/24 0605  WBC 9.2 8.6 7.8 6.9 8.8  HGB 12.9* 13.0 12.4* 11.9* 12.4*  HCT 37.4* 38.4* 36.3* 35.7* 36.6*  MCV 93.3 94.6 94.0 94.9 93.8  PLT 153 145* 152 143* 195  Basic Metabolic Panel: Recent Labs  Lab  06/17/24 1455 06/18/24 0455 06/18/24 1529 06/19/24 0505 06/20/24 0545 06/23/24 0605  NA  --  135 135 141 140 136  K  --  4.1 4.5 3.5 3.6 4.5  CL  --  99 99 103 101 98  CO2  --  24 25 27 27 29   GLUCOSE  --  286* 405* 84 209* 232*  BUN  --  18 17 15 12 21   CREATININE  --  1.12 1.17 1.06 1.08 1.17  CALCIUM   --  9.8 9.8 9.8 9.4 10.1  MG 1.6*  --   --   --   --   --    GFR: Estimated Creatinine Clearance: 73.4 mL/min (by C-G formula based on SCr of 1.17 mg/dL). Liver Function Tests: No results for input(s): AST, ALT, ALKPHOS, BILITOT, PROT, ALBUMIN in the last 168 hours.  No results for input(s): LIPASE, AMYLASE in the last 168 hours. No results for input(s): AMMONIA in the last 168 hours. Coagulation Profile: No results for input(s): INR, PROTIME in the last 168 hours. Cardiac Enzymes: No results for input(s): CKTOTAL, CKMB, CKMBINDEX, TROPONINI in the last 168 hours. BNP (last 3 results) No results for input(s): PROBNP in the last 8760 hours. HbA1C: No results for input(s): HGBA1C in the last 72 hours.  CBG: Recent Labs  Lab 06/23/24 1614 06/23/24 2131 06/23/24 2156 06/24/24 0745 06/24/24 1157  GLUCAP 206* 145* 126* 189* 182*   Lipid Profile: No results for input(s): CHOL, HDL, LDLCALC, TRIG, CHOLHDL, LDLDIRECT in the last 72 hours. Thyroid Function Tests: No results for input(s): TSH, T4TOTAL, FREET4, T3FREE, THYROIDAB in the last 72 hours. Anemia Panel: No results for input(s): VITAMINB12, FOLATE, FERRITIN, TIBC, IRON, RETICCTPCT in the last 72 hours. Most Recent Urinalysis On File:     Component Value Date/Time   COLORURINE YELLOW (A) 06/17/2024 1439   APPEARANCEUR CLOUDY (A) 06/17/2024 1439   LABSPEC 1.015 06/17/2024 1439   PHURINE 5.0 06/17/2024 1439   GLUCOSEU >=500 (A) 06/17/2024 1439   HGBUR LARGE (A) 06/17/2024 1439   BILIRUBINUR NEGATIVE 06/17/2024 1439   KETONESUR NEGATIVE 06/17/2024  1439   PROTEINUR 100 (A) 06/17/2024 1439   NITRITE POSITIVE (A) 06/17/2024 1439   LEUKOCYTESUR MODERATE (A) 06/17/2024 1439   Sepsis Labs: @LABRCNTIP (procalcitonin:4,lacticidven:4) Microbiology: Recent Results (from the past 240 hours)  Resp panel by RT-PCR (RSV, Flu A&B, Covid) Anterior Nasal Swab     Status: None   Collection Time: 06/17/24  2:05 PM   Specimen: Anterior Nasal Swab  Result Value Ref Range Status   SARS Coronavirus 2 by RT PCR NEGATIVE NEGATIVE Final    Comment: (NOTE) SARS-CoV-2 target nucleic acids are NOT DETECTED.  The SARS-CoV-2 RNA is generally detectable in upper respiratory specimens during the acute phase of infection. The lowest concentration of SARS-CoV-2 viral copies this assay can detect is 138 copies/mL. A negative result does not preclude SARS-Cov-2 infection and should not be used as the sole basis for treatment or other patient management decisions. A negative result may occur with  improper specimen collection/handling, submission of specimen other than nasopharyngeal swab, presence of viral mutation(s) within the areas targeted by this assay, and inadequate number of viral copies(<138 copies/mL). A negative result must be combined with clinical observations, patient history, and epidemiological information. The expected result is Negative.  Fact Sheet for Patients:  bloggercourse.com  Fact Sheet for Healthcare Providers:  seriousbroker.it  This test is no t yet approved or cleared by the  [place]  FDA and  has been authorized for detection and/or diagnosis of SARS-CoV-2 by FDA under an Emergency Use Authorization (EUA). This EUA will remain  in effect (meaning this test can be used) for the duration of the COVID-19 declaration under Section 564(b)(1) of the Act, 21 U.S.C.section 360bbb-3(b)(1), unless the authorization is terminated  or revoked sooner.       Influenza A by PCR  NEGATIVE NEGATIVE Final   Influenza B by PCR NEGATIVE NEGATIVE Final    Comment: (NOTE) The Xpert Xpress SARS-CoV-2/FLU/RSV plus assay is intended as an aid in the diagnosis of influenza from Nasopharyngeal swab specimens and should not be used as a sole basis for treatment. Nasal washings and aspirates are unacceptable for Xpert Xpress SARS-CoV-2/FLU/RSV testing.  Fact Sheet for Patients: bloggercourse.com  Fact Sheet for Healthcare Providers: seriousbroker.it  This test is not yet approved or cleared by the [place]  FDA and has been authorized for detection and/or diagnosis of SARS-CoV-2 by FDA under an Emergency Use Authorization (EUA). This EUA will remain in effect (meaning this test can be used) for the duration of the COVID-19 declaration under Section 564(b)(1) of the Act, 21 U.S.C. section 360bbb-3(b)(1), unless the authorization is terminated or revoked.     Resp Syncytial Virus by PCR NEGATIVE NEGATIVE Final    Comment: (NOTE) Fact Sheet for Patients: bloggercourse.com  Fact Sheet for Healthcare Providers: seriousbroker.it  This test is not yet approved or cleared by the [place]  FDA and has been authorized for detection and/or diagnosis of SARS-CoV-2 by FDA under an Emergency Use Authorization (EUA). This EUA will remain in effect (meaning this test can be used) for the duration of the COVID-19 declaration under Section 564(b)(1) of the Act, 21 U.S.C. section 360bbb-3(b)(1), unless the authorization is terminated or revoked.  Performed at [facility], [address], [place], [place] [address]   Urine Culture     Status: Abnormal   Collection Time: 06/17/24  2:39 PM   Specimen: Urine, Catheterized  Result Value Ref Range Status   Specimen Description   Final    URINE, CATHETERIZED Performed at [facility], [address]  [place], [place] [address]    Special Requests   Final    NONE Performed at [facility], [address] [place], [place] [address]    Culture >=100,000 COLONIES/mL ESCHERICHIA COLI (A)  Final   Report Status 06/19/2024 FINAL  Final   Organism ID, Bacteria ESCHERICHIA COLI (A)  Final      Susceptibility   Escherichia coli - MIC*    AMPICILLIN >=32 RESISTANT Resistant     CEFAZOLIN  (URINE) Value in next row Resistant      >=32 RESISTANTThis is a modified FDA-approved test that has been validated and its performance characteristics determined by the reporting laboratory.  This laboratory is certified under the Clinical Laboratory Improvement Amendments CLIA as qualified to perform high complexity clinical laboratory testing.    CEFEPIME  Value in next row Sensitive      >=32 RESISTANTThis is a modified FDA-approved test that has been validated and its performance characteristics determined by the reporting laboratory.  This laboratory is certified under the Clinical Laboratory Improvement Amendments CLIA as qualified to perform high complexity clinical laboratory testing.    ERTAPENEM Value in next row Sensitive      >=32 RESISTANTThis is a modified FDA-approved test that has been validated and its performance characteristics determined by the reporting laboratory.  This laboratory is certified under the  Clinical Laboratory Improvement Amendments CLIA as qualified to perform high complexity clinical laboratory testing.    CEFTRIAXONE  Value in next row Sensitive      >=32 RESISTANTThis is a modified FDA-approved test that has been validated and its performance characteristics determined by the reporting laboratory.  This laboratory is certified under the Clinical Laboratory Improvement Amendments CLIA as qualified to perform high complexity clinical laboratory testing.    CIPROFLOXACIN Value in next row Intermediate      >=32 RESISTANTThis is a modified FDA-approved test that has been validated  and its performance characteristics determined by the reporting laboratory.  This laboratory is certified under the Clinical Laboratory Improvement Amendments CLIA as qualified to perform high complexity clinical laboratory testing.    GENTAMICIN Value in next row Sensitive      >=32 RESISTANTThis is a modified FDA-approved test that has been validated and its performance characteristics determined by the reporting laboratory.  This laboratory is certified under the Clinical Laboratory Improvement Amendments CLIA as qualified to perform high complexity clinical laboratory testing.    NITROFURANTOIN Value in next row Resistant      >=32 RESISTANTThis is a modified FDA-approved test that has been validated and its performance characteristics determined by the reporting laboratory.  This laboratory is certified under the Clinical Laboratory Improvement Amendments CLIA as qualified to perform high complexity clinical laboratory testing.    TRIMETH/SULFA Value in next row Resistant      >=32 RESISTANTThis is a modified FDA-approved test that has been validated and its performance characteristics determined by the reporting laboratory.  This laboratory is certified under the Clinical Laboratory Improvement Amendments CLIA as qualified to perform high complexity clinical laboratory testing.    AMPICILLIN/SULBACTAM Value in next row Resistant      >=32 RESISTANTThis is a modified FDA-approved test that has been validated and its performance characteristics determined by the reporting laboratory.  This laboratory is certified under the Clinical Laboratory Improvement Amendments CLIA as qualified to perform high complexity clinical laboratory testing.    PIP/TAZO Value in next row Sensitive      8 SENSITIVEThis is a modified FDA-approved test that has been validated and its performance characteristics determined by the reporting laboratory.  This laboratory is certified under the Clinical Laboratory Improvement  Amendments CLIA as qualified to perform high complexity clinical laboratory testing.    MEROPENEM Value in next row Sensitive      8 SENSITIVEThis is a modified FDA-approved test that has been validated and its performance characteristics determined by the reporting laboratory.  This laboratory is certified under the Clinical Laboratory Improvement Amendments CLIA as qualified to perform high complexity clinical laboratory testing.    * >=100,000 COLONIES/mL ESCHERICHIA COLI  C Difficile Quick Screen w PCR reflex     Status: None   Collection Time: 06/17/24  2:39 PM   Specimen: STOOL  Result Value Ref Range Status   C Diff antigen NEGATIVE NEGATIVE Final   C Diff toxin NEGATIVE NEGATIVE Final   C Diff interpretation No C. difficile detected.  Final    Comment: Performed at [facility], [address] [place], [place] [address]  Gastrointestinal Panel by PCR , Stool     Status: None   Collection Time: 06/17/24  2:39 PM   Specimen: Stool  Result Value Ref Range Status   Campylobacter species NOT DETECTED NOT DETECTED Final   Plesimonas shigelloides NOT DETECTED NOT DETECTED Final   Salmonella species NOT DETECTED NOT DETECTED Final   Yersinia  enterocolitica NOT DETECTED NOT DETECTED Final   Vibrio species NOT DETECTED NOT DETECTED Final   Vibrio cholerae NOT DETECTED NOT DETECTED Final   Enteroaggregative E coli (EAEC) NOT DETECTED NOT DETECTED Final   Enteropathogenic E coli (EPEC) NOT DETECTED NOT DETECTED Final   Enterotoxigenic E coli (ETEC) NOT DETECTED NOT DETECTED Final   Shiga like toxin producing E coli (STEC) NOT DETECTED NOT DETECTED Final   Shigella/Enteroinvasive E coli (EIEC) NOT DETECTED NOT DETECTED Final   Cryptosporidium NOT DETECTED NOT DETECTED Final   Cyclospora cayetanensis NOT DETECTED NOT DETECTED Final   Entamoeba histolytica NOT DETECTED NOT DETECTED Final   Giardia lamblia NOT DETECTED NOT DETECTED Final   Adenovirus F40/41 NOT DETECTED NOT DETECTED  Final   Astrovirus NOT DETECTED NOT DETECTED Final   Norovirus GI/GII NOT DETECTED NOT DETECTED Final   Rotavirus A NOT DETECTED NOT DETECTED Final   Sapovirus (I, II, IV, and V) NOT DETECTED NOT DETECTED Final    Comment: Performed at [facility], [address], [place], [place] [address]      Radiology Studies last 3 days: No results found.      Time spent: 50 min     Dakotah Orrego, DO [facility] 06/24/2024, 2:08 PM    Dictation software may have been used to generate the above note. Typos may occur and escape review in typed/dictated notes. Please contact Dr Marsa directly for clarity if needed.  Staff may message me via secure chat in Epic  but this may not receive an immediate response,  please page me for urgent matters!  If 7PM-7AM, please contact night coverage [email or web address]       "

## 2024-06-24 NOTE — Plan of Care (Signed)
  Problem: Clinical Measurements: Goal: Will remain free from infection Outcome: Progressing   Problem: Nutrition: Goal: Adequate nutrition will be maintained Outcome: Progressing   Problem: Elimination: Goal: Will not experience complications related to bowel motility Outcome: Progressing

## 2024-06-25 ENCOUNTER — Ambulatory Visit: Admitting: Urology

## 2024-06-25 LAB — GLUCOSE, CAPILLARY
Glucose-Capillary: 175 mg/dL — ABNORMAL HIGH (ref 70–99)
Glucose-Capillary: 178 mg/dL — ABNORMAL HIGH (ref 70–99)
Glucose-Capillary: 225 mg/dL — ABNORMAL HIGH (ref 70–99)
Glucose-Capillary: 173 mg/dL — ABNORMAL HIGH (ref 70–99)

## 2024-06-25 MED ORDER — INSULIN ASPART 100 UNIT/ML IJ SOLN
7.0000 [IU] | Freq: Three times a day (TID) | INTRAMUSCULAR | Status: DC
  Administered 2024-06-26 – 2024-06-27 (×6): 7 [IU] via SUBCUTANEOUS
  Filled 2024-06-25 (×6): qty 7

## 2024-06-25 NOTE — Progress Notes (Signed)
 " PROGRESS NOTE    Troy Morse  FMW:[id number] DOB: 1961/04/11 DOA: 06/17/2024 PCP: [name], MD  145A/[id number]-AA  LOS: 6 days   Brief hospital course:   Assessment & Plan: Troy Morse is a 64 y.o. year old male with medical history of hypertension, hyperlipidemia, type 2 diabetes, history of CVA, history of PE on Eliquis  who presented to the ED with increasing weakness and somnolence.  Patient with poor functional status who has chronic foley catheter and later treatment for UTI.  His last catheter was placed 6 weeks ago. Per wife, he was supposed to have urology appointment to remove catheter but cancelled due to weakness. He denies any fevers or chills.    01/06: On arrival to the ED patient was noted to be HDS. CBC without leukocytosis, CMP with moderate hyperglycemia but.  Respiratory panel negative for COVID, flu, RSV.  UA consistent with urinary tract infection.  Urine culture pending.  Patient given 1 dose of ceftriaxone , appears last culture showed E. coli.  Admitted to hospitalist.  01/07: ongoing confusion/encephalopathic. UCx again (+)Ecoli, pending susceptibilities. Aspiration event this morning, question vomiting. NPO pending SLP eval, continue NS fluids. Foley exchanged today  01/08: much more alert / baseline. UCx (+)EColi multiple resistances, d/w ID pharmacist - plan for gentamicin dose and DC home however pt's wife requests he stays here for void trial, she would like to establish him w/ [facility] urologist instead of [facility]. D/w urology - will see pt, for now can continue w/ ceftriaxone  01/09: d/w urology, recommendation is for outpatient void trial but there is concern re transportation and mobility issues - d/w urology, will plan for void trial here Monday, will have to be Monday given limited availability of urology coverage through the weekend in case of adverse outcome. I attempted to call Troy Morse to relay this plan this morning at 09:50 but no answer, will attempt  again later. PT/OT also to see, given concern for pt weaker than when he was DC from rehab at [facility] at which point he was ambulating.  01/10-01/11: await void trial Mon 01/12 01/12: successful void trial today. Awaiting SNF rehab - needs peer-to-peer 01/13: peer-to-peer completed this morning, I spoke w/ a Dr. Segel. Rehab was denied - stated reason being that the pt appears to be at his baseline now bed-bound which is same as his level of function prior to developing the UTI / prior to the reason for this hospitalization. Pt's wife is appealing the denial. PT/OT to reassess today / continue work w/ patient as able.    Urinary Tract Infection UCX (+)Ecoli multiple resistances Altered mental status / metabolic encephalopathy d/t UTI - resolved  UTI POA associated w/ catheter which was also POA  -- continue ceftriaxone  x7 days - finish 01/12    Urinary retention - improved/resolved  Urology following Given pt's urinary retention after previous hospitalization, he has had foley catheter. Plan was to follow up with urology on 06/16/24 for voiding trial but did not go d/t weakness. Foley exchanged here 01/07 beginning of this hospital stay. Void trial 06/23/2024 successful, per urology ok to discharge without Foley  Continue finasteride     Type 2 diabetes w/ hyperglycemia  Uncontrolled diabetes may be leading to recurrent UTI.  --cont glargine 35u daily --increase mealtime to 7u TID --ACHS and SSI   Aspiration event witnessed by RN 06/18/24 Potential aspiration pneumonitis but no symptoms Aspiration d/t encephalopathy, which has resolved Given CVA remains aspiration risk especially w/ other causes  for weakness (currently, infection) Issue has resolved now that pt is more alert, he is tolerating diet  SLP eval --> regular diet   RUE swelling w/o tenderness or pitting edema - improving w/ elevation  Hx CVA w/ residual deficits  See [facility] admission 10/2023 Of note, PFO on echo, per neurology  this was NOT contributory to CVA, see consult note 11/02/2023 --on eliquis  --cont Asa and statin   Hx PE 11/2023 --cont Eliquis    Essential Hypertension   History of hypotension. Not on any pharmacotherapy  --monitor   Hyperlipidemia/CAD:  Continue home statin.   CKD 3A: At baseline.   Renally dose medications as needed, avoid nephrotoxic agents. Monitor BMP   GAD Continue home clonazepam , zoloft     GERD:  Continue home PPI   Debility: 2/2 to strokes since July.  Bed bound prior to admission here.  he gets home PT/OT Insurance has denied rehab  Of note, he was ambulatory w. [facility] on discharge from [facility] rehab 04/2024 Family is appealing SNF denial     Diarrhea: Appears to be chronic.  C. Diff negative.  GI panel also negative   Overweight based on BMI: Body mass index is 27.39 kg/m.   DVT prophylaxis: On:Eliquis  Code Status: DNR  Family Communication:  Level of care: Med-Surg Dispo:   The patient is from: home Anticipated d/c is to: to be determined Anticipated d/c date is: whenever disposition determined   Subjective and Interval History:  Pt denied pain.  Said he was able to urinate.   Objective: Vitals:   06/25/24 0404 06/25/24 0723 06/25/24 1704 06/25/24 1928  BP: (!) 152/80 132/67 134/74 134/70  Pulse: 61 (!) 57 (!) 59 64  Resp: 17 16 16 17   Temp: 98.4 F (36.9 C) 99 F (37.2 C) 98.5 F (36.9 C) 98.3 F (36.8 C)  TempSrc:      SpO2: 95% 91% 92% 92%  Weight: 93.4 kg     Height:        Intake/Output Summary (Last 24 hours) at 06/25/2024 2245 Last data filed at 06/25/2024 1900 Gross per 24 hour  Intake 120 ml  Output 1450 ml  Net -1330 ml   Filed Weights   06/23/24 0500 06/24/24 0500 06/25/24 0404  Weight: 92.5 kg 93.9 kg 93.4 kg    Examination:   Constitutional: NAD, alert, oriented to self and hospital HEENT: conjunctivae and lids normal, EOMI CV: No cyanosis.   RESP: normal respiratory effort, on RA   Data Reviewed: I have  personally reviewed labs and imaging studies  Time spent: 35 minutes  Troy Haber, MD [facility] If 7PM-7AM, please contact night-coverage 06/25/2024, 10:45 PM   "

## 2024-06-25 NOTE — Inpatient Diabetes Management (Signed)
 Inpatient Diabetes Program Recommendations  AACE/ADA: New Consensus Statement on Inpatient Glycemic Control   Target Ranges:  Prepandial:   less than 140 mg/dL      Peak postprandial:   less than 180 mg/dL (1-2 hours)      Critically ill patients:  140 - 180 mg/dL    Latest Reference Range & Units 06/24/24 07:45 06/24/24 11:57 06/24/24 17:00 06/24/24 20:13  Glucose-Capillary 70 - 99 mg/dL 810 (H) 817 (H) 745 (H) 272 (H)   Review of Glycemic Control  Diabetes history: DM2 Outpatient Diabetes medications: Lantus  22-24 units daily, Humalog 5-20 units TID with meals Current orders for Inpatient glycemic control: Lantus  35 units daily, Novolog  0-20 units TID with meals, Novolog  0-5 units at bedtime, Novolog  5 units TID with meals   Inpatient Diabetes Program Recommendations:     Insulin : If postprandial glucose remains consistently over 180 mg/dl, may want to consider increasing meal coverage to Novolog  7 units TID with meals.  Thanks, Earnie Gainer, RN, MSN, CDCES Diabetes Coordinator Inpatient Diabetes Program [phone] (Team Pager from 8am to 5pm)

## 2024-06-25 NOTE — Progress Notes (Signed)
 Physical Therapy Treatment Patient Details Name: Troy Morse MRN: [id number] DOB: May 09, 1961 Today's Date: 06/25/2024   History of Present Illness Pt is a 64 y/o M admitted on 06/17/24 after presenting with c/o increasing weakness & somnolence. Pt is being treated for complicated UTI. PMH: HTN, HLD, DM2, CVA, PE, chronic renal disease    PT Comments  Patient received in bed, he is agreeable to PT/OT session. Patient requires max to total A +2 for bed mobility. Requires assistance positioning of extremities to assist with sitting balance while on edge of bed and requires assist to prevent posterior leaning and left leaning when sitting. Cues needed to correct posture, which he is able to do mildly then reverts back. Patient was able to partially stand in stedy with max +2 assist. He will continue to benefit from skilled PT to improve strength and functional independence.        If plan is discharge home, recommend the following: Two people to help with walking and/or transfers;Two people to help with bathing/dressing/bathroom   Can travel by private vehicle     No  Equipment Recommendations  Hoyer lift;Hospital bed    Recommendations for Other Services       Precautions / Restrictions Precautions Precautions: Fall Recall of Precautions/Restrictions: Impaired Restrictions Weight Bearing Restrictions Per Provider Order: No     Mobility  Bed Mobility Overal bed mobility: Needs Assistance Bed Mobility: Supine to Sit     Supine to sit: Max assist, +2 for physical assistance, HOB elevated, Total assist     General bed mobility comments: Patient requires +2 for bed mobility.  Stiffness noted in B LEs and RUE. Minimal use of L UE. Patient with heavy posterior leaning and right leaning with initial sitting on edge of bed. Requires assistance to position LEs on floor for improved balance.    Transfers Overall transfer level: Needs assistance Equipment used: Ambulation equipment  used Transfers: Sit to/from Stand Sit to Stand: Via lift equipment, Max assist, +2 physical assistance           General transfer comment: Patient is able to partially stand using stedy with +2 max A. Unable to get fully upright with assistance.    Ambulation/Gait               General Gait Details: unable at baseline   Stairs             Wheelchair Mobility     Tilt Bed    Modified Rankin (Stroke Patients Only)       Balance Overall balance assessment: Needs assistance Sitting-balance support: Feet supported Sitting balance-Leahy Scale: Poor Sitting balance - Comments: patient varying assistance from mod/max to supervision for brief time sitting edge of bed. Requires positioning assistance to maximize balance. Postural control: Posterior lean, Left lateral lean Standing balance support: Bilateral upper extremity supported, During functional activity, Reliant on assistive device for balance Standing balance-Leahy Scale: Zero                              Communication Communication Communication: Impaired Factors Affecting Communication: Difficulty expressing self;Reduced clarity of speech  Cognition Arousal: Alert Behavior During Therapy: Flat affect   PT - Cognitive impairments: Difficult to assess                       PT - Cognition Comments: Answers single word responses to questions appropriately, increased time needed to respond  Following commands: Impaired Following commands impaired: Follows one step commands with increased time    Cueing Cueing Techniques: Verbal cues, Gestural cues, Tactile cues  Exercises      General Comments        Pertinent Vitals/Pain Pain Assessment Pain Location: R UE and R LE with movement/positioning Pain Descriptors / Indicators: Discomfort, Grimacing, Moaning Pain Intervention(s): Monitored during session, Repositioned    Home Living                          Prior  Function            PT Goals (current goals can now be found in the care plan section) Acute Rehab PT Goals Patient Stated Goal: get better PT Goal Formulation: With patient Time For Goal Achievement: 07/02/24 Potential to Achieve Goals: Fair Progress towards PT goals: Progressing toward goals    Frequency    Min 2X/week      PT Plan      Co-evaluation PT/OT/SLP Co-Evaluation/Treatment: Yes Reason for Co-Treatment: For patient/therapist safety;To address functional/ADL transfers;Complexity of the patient's impairments (multi-system involvement) PT goals addressed during session: Mobility/safety with mobility;Balance;Proper use of DME        AM-PAC PT 6 Clicks Mobility   Outcome Measure  Help needed turning from your back to your side while in a flat bed without using bedrails?: Total Help needed moving from lying on your back to sitting on the side of a flat bed without using bedrails?: Total Help needed moving to and from a bed to a chair (including a wheelchair)?: Total Help needed standing up from a chair using your arms (e.g., wheelchair or bedside chair)?: Total Help needed to walk in hospital room?: Total Help needed climbing 3-5 steps with a railing? : Total 6 Click Score: 6    End of Session   Activity Tolerance: Patient limited by fatigue Patient left: in bed;Other (comment) (left with OT in room) Nurse Communication: Mobility status PT Visit Diagnosis: Other abnormalities of gait and mobility (R26.89);Muscle weakness (generalized) (M62.81);Pain;Unsteadiness on feet (R26.81) Pain - Right/Left: Right Pain - part of body: Shoulder     Time: 1320-1330 PT Time Calculation (min) (ACUTE ONLY): 10 min  Charges:    $Therapeutic Activity: 8-22 mins PT General Charges $$ ACUTE PT VISIT: 1 Visit                     Devynne Sturdivant, PT, GCS 06/25/2024,1:49 PM

## 2024-06-25 NOTE — Plan of Care (Signed)

## 2024-06-25 NOTE — Progress Notes (Signed)
 Occupational Therapy Treatment Patient Details Name: MAGGIE DWORKIN MRN: [id number] DOB: [birth year] Today's Date: 06/25/2024   History of present illness Pt is a 64 y/o M admitted on 06/17/24 after presenting with c/o increasing weakness & somnolence. Pt is being treated for complicated UTI. PMH: HTN, HLD, DM2, CVA, PE, chronic renal disease   OT comments  Pt seen for OT treatment on this date. Upon arrival to room pt in bed, agreeable to tx. Pt requires Total A don/doff B socks at bed level. Setup + supervision self drinking at bed level, non dominant LUE, decreasing to MAX A self drinking sitting EOB, assist for balance. Simulated bed <>BSC t/f using SARA STEDY with MAX A x2.  Pt making fair progress toward goals, will continue to follow POC. Discharge recommendation remains appropriate.        If plan is discharge home, recommend the following:  Two people to help with walking and/or transfers;Two people to help with bathing/dressing/bathroom;Assist for transportation   Equipment Recommendations  Hospital bed;Hoyer lift    Recommendations for Other Services      Precautions / Restrictions Precautions Precautions: Fall Recall of Precautions/Restrictions: Impaired Restrictions Weight Bearing Restrictions Per Provider Order: No       Mobility Bed Mobility Overal bed mobility: Needs Assistance Bed Mobility: Supine to Sit     Supine to sit: Max assist, +2 for physical assistance, HOB elevated, Total assist Sit to supine: Max assist, +2 for physical assistance        Transfers Overall transfer level: Needs assistance Equipment used: Ambulation equipment used Transfers: Sit to/from Stand Sit to Stand: Via lift equipment, Max assist, +2 physical assistance           General transfer comment: clears rear Transfer via Lift Equipment: Stedy   Balance Overall balance assessment: Needs assistance Sitting-balance support: Feet supported Sitting balance-Leahy Scale:  Poor Sitting balance - Comments: fluctuates Max A- CGA Postural control: Posterior lean, Left lateral lean Standing balance support: Bilateral upper extremity supported, During functional activity, Reliant on assistive device for balance Standing balance-Leahy Scale: Zero                             ADL either performed or assessed with clinical judgement   ADL Overall ADL's : Needs assistance/impaired                                       General ADL Comments: Total A don/doff B socks at bed level. Setup + supervision self drinking at bed level, non dominant LUE, decreasing to MAX A self drinking sitting EOB, assist for balance. Simulated bed <>BSC t/f using SARA STEDY with MAX A x2.    Extremity/Trunk Assessment              Diplomatic Services Operational Officer Communication Communication: Impaired Factors Affecting Communication: Difficulty expressing self;Reduced clarity of speech   Cognition Arousal: Alert Behavior During Therapy: Flat affect Cognition: Difficult to assess Difficult to assess due to: Impaired communication           OT - Cognition Comments: aphasia                 Following commands: Impaired Following commands impaired: Follows one step commands with increased time  Cueing   Cueing Techniques: Verbal cues, Gestural cues, Tactile cues  Exercises      Shoulder Instructions       General Comments      Pertinent Vitals/ Pain       Pain Assessment Pain Assessment: PAINAD Breathing: normal Negative Vocalization: occasional moan/groan, low speech, negative/disapproving quality Facial Expression: sad, frightened, frown Body Language: tense, distressed pacing, fidgeting Consolability: distracted or reassured by voice/touch PAINAD Score: 4 Pain Location: R UE and R LE with movement/positioning Pain Descriptors / Indicators: Discomfort, Grimacing, Moaning Pain Intervention(s):  Repositioned  Home Living                                          Prior Functioning/Environment              Frequency  Min 2X/week        Progress Toward Goals  OT Goals(current goals can now be found in the care plan section)  Progress towards OT goals: Progressing toward goals     Plan      Co-evaluation    PT/OT/SLP Co-Evaluation/Treatment: Yes Reason for Co-Treatment: For patient/therapist safety;To address functional/ADL transfers;Complexity of the patient's impairments (multi-system involvement) PT goals addressed during session: Mobility/safety with mobility;Balance;Proper use of DME OT goals addressed during session: ADL's and self-care      AM-PAC OT 6 Clicks Daily Activity     Outcome Measure   Help from another person eating meals?: A Lot Help from another person taking care of personal grooming?: A Lot Help from another person toileting, which includes using toliet, bedpan, or urinal?: A Lot Help from another person bathing (including washing, rinsing, drying)?: A Lot Help from another person to put on and taking off regular upper body clothing?: A Lot Help from another person to put on and taking off regular lower body clothing?: A Lot 6 Click Score: 12    End of Session    OT Visit Diagnosis: Other abnormalities of gait and mobility (R26.89)   Activity Tolerance Patient tolerated treatment well   Patient Left in bed;with call bell/phone within reach;with bed alarm set   Nurse Communication          Time: 8687-8658 OT Time Calculation (min): 29 min  Charges: OT General Charges $OT Visit: 1 Visit OT Treatments $Self Care/Home Management : 8-22 mins  Donice Alperin OTS   Ronita Sauers 06/25/2024, 2:11 PM

## 2024-06-26 LAB — GLUCOSE, CAPILLARY
Glucose-Capillary: 206 mg/dL — ABNORMAL HIGH (ref 70–99)
Glucose-Capillary: 203 mg/dL — ABNORMAL HIGH (ref 70–99)
Glucose-Capillary: 152 mg/dL — ABNORMAL HIGH (ref 70–99)
Glucose-Capillary: 107 mg/dL — ABNORMAL HIGH (ref 70–99)

## 2024-06-26 NOTE — Progress Notes (Signed)
 " PROGRESS NOTE    Troy Morse  FMW:[id number] DOB: 1960-10-27 DOA: 06/17/2024 PCP: [name], MD  145A/145A-AA  LOS: 7 days   Brief hospital course:   Assessment & Plan: Troy Morse is a 64 y.o. year old male with medical history of hypertension, hyperlipidemia, type 2 diabetes, history of CVA, history of PE on Eliquis  who presented to the ED with increasing weakness and somnolence.  Patient with poor functional status who has chronic foley catheter and later treatment for UTI.  His last catheter was placed 6 weeks ago. Per wife, he was supposed to have urology appointment to remove catheter but cancelled due to weakness. He denies any fevers or chills.    01/06: On arrival to the ED patient was noted to be HDS. CBC without leukocytosis, CMP with moderate hyperglycemia but.  Respiratory panel negative for COVID, flu, RSV.  UA consistent with urinary tract infection.  Urine culture pending.  Patient given 1 dose of ceftriaxone , appears last culture showed E. coli.  Admitted to hospitalist.  01/07: ongoing confusion/encephalopathic. UCx again (+)Ecoli, pending susceptibilities. Aspiration event this morning, question vomiting. NPO pending SLP eval, continue NS fluids. Foley exchanged today  01/08: much more alert / baseline. UCx (+)EColi multiple resistances, d/w ID pharmacist - plan for gentamicin dose and DC home however pt's wife requests he stays here for void trial, she would like to establish him w/ [facility] urologist instead of [facility]. D/w urology - will see pt, for now can continue w/ ceftriaxone  01/09: d/w urology, recommendation is for outpatient void trial but there is concern re transportation and mobility issues - d/w urology, will plan for void trial here Monday, will have to be Monday given limited availability of urology coverage through the weekend in case of adverse outcome. I attempted to call Mrs. Hail to relay this plan this morning at 09:50 but no answer, will attempt  again later. PT/OT also to see, given concern for pt weaker than when he was DC from rehab at [facility] at which point he was ambulating.  01/10-01/11: await void trial Mon 01/12 01/12: successful void trial today. Awaiting SNF rehab - needs peer-to-peer 01/13: peer-to-peer completed this morning, I spoke w/ a Dr. Segel. Rehab was denied - stated reason being that the pt appears to be at his baseline now bed-bound which is same as his level of function prior to developing the UTI / prior to the reason for this hospitalization. Pt's wife is appealing the denial. PT/OT to reassess today / continue work w/ patient as able.    Urinary Tract Infection UCX (+)Ecoli multiple resistances Altered mental status / metabolic encephalopathy d/t UTI - resolved  UTI POA associated w/ catheter which was also POA  -- continue ceftriaxone  x7 days - finish 01/12    Urinary retention - improved/resolved  Urology following Given pt's urinary retention after previous hospitalization, he has had foley catheter. Plan was to follow up with urology on 06/16/24 for voiding trial but did not go d/t weakness. Foley exchanged here 01/07 beginning of this hospital stay. Void trial 06/23/2024 successful, per urology ok to discharge without Foley  Continue finasteride     Type 2 diabetes w/ hyperglycemia  Uncontrolled diabetes may be leading to recurrent UTI.  --cont glargine 35u daily --mealtime to 7u TID --ACHS and SSI   Aspiration event witnessed by RN 06/18/24 Potential aspiration pneumonitis but no symptoms Aspiration d/t encephalopathy, which has resolved Given CVA remains aspiration risk especially w/ other causes for  weakness (currently, infection) Issue has resolved now that pt is more alert, he is tolerating diet  SLP eval --> regular diet   RUE swelling w/o tenderness or pitting edema - improving w/ elevation  Hx CVA w/ residual deficits  See [facility] admission 10/2023 Of note, PFO on echo, per neurology this was  NOT contributory to CVA, see consult note 11/02/2023 --on eliquis  --cont Asa and statin   Hx PE 11/2023 --cont Eliquis    Essential Hypertension   History of hypotension. Not on any pharmacotherapy  --monitor   Hyperlipidemia/CAD:  Continue home statin.   CKD 3A: At baseline.   Renally dose medications as needed, avoid nephrotoxic agents. Monitor BMP   GAD Continue home clonazepam , zoloft     GERD:  Continue home PPI   Debility: 2/2 to strokes since July.  Bed bound prior to admission here.  he gets home PT/OT Insurance has denied rehab  Of note, he was ambulatory w. [facility] on discharge from [facility] rehab 04/2024 Family is appealing SNF denial     Diarrhea: Appears to be chronic.  C. Diff negative.  GI panel also negative   Overweight based on BMI: Body mass index is 27.39 kg/m.   DVT prophylaxis: On:Eliquis  Code Status: DNR  Family Communication:  Level of care: Med-Surg Dispo:   The patient is from: home Anticipated d/c is to: to be determined Anticipated d/c date is: whenever disposition determined   Subjective and Interval History:  Wife was concerned that pt's eyes were red, which did not appear to be red to RN and me.   Objective: Vitals:   06/26/24 0500 06/26/24 0735 06/26/24 1535 06/26/24 1932  BP:  138/63 109/61 (!) 135/92  Pulse:  (!) 52 (!) 57 67  Resp:  17 17 18   Temp:  97.8 F (36.6 C) 98.5 F (36.9 C) 98.3 F (36.8 C)  TempSrc:    Oral  SpO2:  95% 96% 95%  Weight: 88 kg     Height:        Intake/Output Summary (Last 24 hours) at 06/26/2024 2231 Last data filed at 06/26/2024 1916 Gross per 24 hour  Intake 240 ml  Output 1250 ml  Net -1010 ml   Filed Weights   06/24/24 0500 06/25/24 0404 06/26/24 0500  Weight: 93.9 kg 93.4 kg 88 kg    Examination:   Constitutional: NAD, alert, slow to respond HEENT: conjunctivae and lids normal, EOMI CV: No cyanosis.   RESP: normal respiratory effort   Data Reviewed: I have personally  reviewed labs and imaging studies  Time spent: 25 minutes  Ellouise Haber, MD [facility] If 7PM-7AM, please contact night-coverage 06/26/2024, 10:31 PM   "

## 2024-06-26 NOTE — Plan of Care (Signed)

## 2024-06-27 LAB — GLUCOSE, CAPILLARY
Glucose-Capillary: 146 mg/dL — ABNORMAL HIGH (ref 70–99)
Glucose-Capillary: 161 mg/dL — ABNORMAL HIGH (ref 70–99)
Glucose-Capillary: 131 mg/dL — ABNORMAL HIGH (ref 70–99)

## 2024-06-27 MED ORDER — CLONAZEPAM 0.5 MG PO TABS: 0.5000 mg | ORAL_TABLET | Freq: Every day | ORAL | 0 refills | Status: AC

## 2024-06-27 MED ORDER — INSULIN GLARGINE 100 UNIT/ML ~~LOC~~ SOLN: 30.0000 [IU] | Freq: Every day | SUBCUTANEOUS | 11 refills | Status: AC

## 2024-06-27 MED ORDER — SODIUM CHLORIDE 0.9 % IV SOLN: INTRAVENOUS | Status: DC

## 2024-06-27 MED ORDER — INSULIN ASPART 100 UNIT/ML IJ SOLN: 7.0000 [IU] | Freq: Three times a day (TID) | INTRAMUSCULAR | Status: AC

## 2024-06-27 MED ORDER — NOVOLOG FLEXPEN 100 UNIT/ML ~~LOC~~ SOPN: 2.0000 [IU] | PEN_INJECTOR | Freq: Three times a day (TID) | SUBCUTANEOUS | Status: AC

## 2024-06-27 NOTE — TOC Transition Note (Signed)
 Transition of Care [facility]) - Discharge Note   Patient Details  Name: Troy Morse MRN: [id number] Date of Birth: September 08, 1960  Transition of Care [facility]) CM/SW Contact:  Daved JONETTA Hamilton, RN Phone Number: 06/27/2024, 6:36 PM   Clinical Narrative:     Patient will DC to: [facility] in [place], [place] Anticipated DC date: 06/27/2024 Family notified: [name] Croak Transport by: Zona  Per MD patient ready for DC to [facility]. RN, patient's family, and facility notified of DC. Discharge Summary sent to facility. RN given number for report. DC packet on chart. Ambulance transport requested for patient.   TOC signing off.   Final next level of care: Skilled Nursing Facility Barriers to Discharge: Barriers Resolved   Patient Goals and CMS Choice            Discharge Placement              Patient chooses bed at: Other - please specify in the comment section below: ([facility] in [place], [place]) Patient to be transferred to facility by: [facility] Name of family member notified: Nancy Manuele Patient and family notified of of transfer: 06/27/24  Discharge Plan and Services Additional resources added to the After Visit Summary for     Discharge Planning Services: CM Consult Post Acute Care Choice: Durable Medical Equipment, Home Health          DME Arranged: Hospital bed, Other see comment Lajean Lift) DME Agency: [employer]                  Social Drivers of Health (SDOH) Interventions SDOH Screenings   Food Insecurity: No Food Insecurity (06/17/2024)  Housing: Low Risk (06/17/2024)  Transportation Needs: No Transportation Needs (06/17/2024)  Utilities: Not At Risk (06/17/2024)  Financial Resource Strain: Low Risk  (04/29/2024)   Received from [facility]  Tobacco Use: Low Risk (06/17/2024)  Health Literacy: Adequate Health Literacy (05/15/2024)   Received from [facility]     Readmission Risk Interventions     No data to display

## 2024-06-27 NOTE — Progress Notes (Signed)
 Report given to [facility] [employer]

## 2024-06-27 NOTE — Progress Notes (Addendum)
 " PROGRESS NOTE    Troy Morse  FMW:[id number] DOB: Mar 10, 1961 DOA: 06/17/2024 PCP: [name], MD  145A/145A-AA  LOS: 8 days   Brief hospital course:   Assessment & Plan: Troy Morse is a 64 y.o. year old male with medical history of hypertension, hyperlipidemia, type 2 diabetes, history of CVA, history of PE on Eliquis  who presented to the ED with increasing weakness and somnolence.  Patient with poor functional status who has chronic foley catheter and later treatment for UTI.  His last catheter was placed 6 weeks ago. Per wife, he was supposed to have urology appointment to remove catheter but cancelled due to weakness.    01/06: On arrival to the ED patient was noted to be HDS. CBC without leukocytosis, CMP with moderate hyperglycemia but.  Respiratory panel negative for COVID, flu, RSV.  UA consistent with urinary tract infection.  Urine culture pending.  Patient given 1 dose of ceftriaxone , appears last culture showed E. coli.  Admitted to hospitalist.  01/07: ongoing confusion/encephalopathic. UCx again (+)Ecoli, pending susceptibilities. Aspiration event this morning, question vomiting. NPO pending SLP eval, continue NS fluids. Foley exchanged today  01/08: much more alert / baseline. UCx (+)EColi multiple resistances, d/w ID pharmacist - plan for gentamicin dose and DC home however pt's wife requests he stays here for void trial, she would like to establish him w/ [facility] urologist instead of [facility]. D/w urology - will see pt, for now can continue w/ ceftriaxone  01/09: d/w urology, recommendation is for outpatient void trial but there is concern re transportation and mobility issues - d/w urology, will plan for void trial here Monday, will have to be Monday given limited availability of urology coverage through the weekend in case of adverse outcome. I attempted to call Mrs. Odriscoll to relay this plan this morning at 09:50 but no answer, will attempt again later. PT/OT also to see,  given concern for pt weaker than when he was DC from rehab at [facility] at which point he was ambulating.  01/10-01/11: await void trial Mon 01/12 01/12: successful void trial today. Awaiting SNF rehab - needs peer-to-peer 01/13: peer-to-peer completed this morning, I spoke w/ a Dr. Segel. Rehab was denied - stated reason being that the pt appears to be at his baseline now bed-bound which is same as his level of function prior to developing the UTI / prior to the reason for this hospitalization. Pt's wife is appealing the denial. 01/16 He remained stable, able to eat 100% of the meals, participated in PT/OT. Still recommending STR. Insurance approved STR and got discharged to rehab in stable condition.    Urinary Tract Infection UCX (+)Ecoli multiple resistances Altered mental status / metabolic encephalopathy d/t UTI - resolved  UTI POA associated w/ catheter which was also POA  -- continue ceftriaxone  x7 days - finish 01/12    Urinary retention - improved/resolved  Urology following Given pt's urinary retention after previous hospitalization, he has had foley catheter. Plan was to follow up with urology on 06/16/24 for voiding trial but did not go d/t weakness. Foley exchanged here 01/07 beginning of this hospital stay. Void trial 06/23/2024 successful, per urology ok to discharge without Foley  Continue finasteride     Type 2 diabetes w/ hyperglycemia  Uncontrolled diabetes may be leading to recurrent UTI.  --cont glargine 30u daily --mealtime to 7u TID, resume home metformin .  --ACHS and SSI   Aspiration event witnessed by RN 06/18/24 Potential aspiration pneumonitis but no symptoms Aspiration  d/t encephalopathy, which has resolved Given CVA remains aspiration risk especially w/ other causes for weakness (currently, infection) Issue has resolved now that pt is more alert, he is tolerating diet  SLP eval --> regular diet   RUE swelling w/o tenderness or pitting edema - improving w/  elevation  Hx CVA w/ residual deficits  See [facility] admission 10/2023 Of note, PFO on echo, per neurology this was NOT contributory to CVA, see consult note 11/02/2023 --cont Asa and statin   Hx PE 11/2023 --cont Eliquis    Essential Hypertension   History of hypotension Hyperlipidemia/CAD:  Continue home aspirin , statin.   CKD 3A: At baseline.   Renally dose medications as needed, avoid nephrotoxic agents. Monitor BMP   GAD Continue home clonazepam , zoloft     GERD:  Continue home PPI   Debility: 2/2 to strokes since July.  Bed bound prior to admission here.  he gets home PT/OT Insurance has denied rehab  Of note, he was ambulatory w. [facility] on discharge from [facility] rehab 04/2024 Family is appealing SNF denial. Later insurance approved.     Overweight based on BMI: Body mass index is 27.39 kg/m.   DVT prophylaxis: On:Eliquis  Code Status: DNR  Family Communication:  Level of care: Med-Surg Dispo:   The patient is from: home Anticipated d/c is to: to be determined. SNF vs home with home care.  Anticipated d/c date is: whenever disposition determined   Subjective and Interval History:   Patient seen and examined.   Alert and orientated, answering questions appropriately Denies any symptoms as such but feels weak in him self.   D/w RN- he had ate 100% of his meals, No issues reported.   Wife at the bedside--she states he can not manage him at home. He was able to feed him self, walks with with walker after [facility] rehab He appealed the discharge waiting to hear back decision from [place].   She reports his eyes are red in the nasolabial junction medially. Then stated he deteriorated since he has been hospitalized though he improved And tolerating diet as documented. Then she was worried about him having another stroke. He did not had any focal signs though he is weak generally.  Clinically do not appear to have another stroke but told her we can do repeat  imaging. Later she said she do not want any tests now. I informed his gen weakness is due to recent hospitalization with UTI and pNA and its going to take time for him to recover. He will need good nutrition and ongoing therapies.   At the end of the meeting she did not had any further questions. I informed her that CM will further discuss with them regarding dc planning.    Objective: Vitals:   06/26/24 1932 06/27/24 0330 06/27/24 0444 06/27/24 0934  BP: (!) 135/92 128/72  (!) 143/71  Pulse: 67 60  (!) 59  Resp: 18 18  17   Temp: 98.3 F (36.8 C) 98.6 F (37 C)  98.3 F (36.8 C)  TempSrc: Oral Oral    SpO2: 95% 94%  91%  Weight:   88.4 kg   Height:        Intake/Output Summary (Last 24 hours) at 06/27/2024 1222 Last data filed at 06/27/2024 0332 Gross per 24 hour  Intake 480 ml  Output 1150 ml  Net -670 ml   Filed Weights   06/25/24 0404 06/26/24 0500 06/27/24 0444  Weight: 93.4 kg 88 kg 88.4 kg    Examination:  Constitutional: NAD, alert, awake, following commands HEENT: conjunctivae and lids normal, EOMI CV: No cyanosis.   RESP: normal respiratory effort Neuro: Moving all extremities, gen weakness, Alert and orientated times 3   Data Reviewed: I have personally reviewed labs and imaging studies  Time spent: 35 minutes  Troy JONELLE Overcast, MD [facility] If 7PM-7AM, please contact night-coverage 06/27/2024, 12:22 PM   "

## 2024-06-27 NOTE — Progress Notes (Signed)
 Occupational Therapy Treatment Patient Details Name: Troy Morse MRN: [id number] DOB: 16-May-1961 Today's Date: 06/27/2024   History of present illness Pt is a 64 y/o M admitted on 06/17/24 after presenting with c/o increasing weakness & somnolence. Pt is being treated for complicated UTI. PMH: HTN, HLD, DM2, CVA, PE, chronic renal disease   OT comments  Pt received in bed, agreeable to OT/PT session. Requires +2 MAX-TOTAL for bed mobility. Able to participate in sitting balance tasks at EOB for majority of session, fluctuates between MIN-TOTAL A for static balance with heavy posterior and L lateral lean. Pt able to correct briefly to midline with multiple trials and max multimodal cuing. OT will continue POC.        If plan is discharge home, recommend the following:  Two people to help with walking and/or transfers;Two people to help with bathing/dressing/bathroom;Assist for transportation   Equipment Recommendations  Hospital bed;Hoyer lift       Precautions / Restrictions Precautions Precautions: Fall Recall of Precautions/Restrictions: Impaired Restrictions Weight Bearing Restrictions Per Provider Order: No       Mobility Bed Mobility Overal bed mobility: Needs Assistance Bed Mobility: Supine to Sit     Supine to sit: Total assist, +2 for physical assistance, +2 for safety/equipment, HOB elevated, Used rails Sit to supine: Total assist, +2 for physical assistance, +2 for safety/equipment, HOB elevated   General bed mobility comments: Requires significant +2 TOTAL assist for all bed mobility. Stiffness in all extremeties, heavy posterior push and L lateral lean.    Transfers                   General transfer comment: NT. Pt requires extensive support sitting EOB (MAX-TOTAL).     Balance Overall balance assessment: Needs assistance Sitting-balance support: Feet supported Sitting balance-Leahy Scale: Zero Sitting balance - Comments: flucuates MIN-TOTAL A. Pt  requires OT to sit behind in tall kneeling for trunk support, heavy posterior push                                   ADL either performed or assessed with clinical judgement   ADL Overall ADL's : Needs assistance/impaired                                       General ADL Comments: TOTAL A for ADL bed level    Extremity/Trunk Assessment Upper Extremity Assessment Upper Extremity Assessment: RUE deficits/detail RUE Deficits / Details: continued edema, left elevated on pillows with slight abduction per MD note                     Communication Communication Communication: Impaired Factors Affecting Communication: Difficulty expressing self;Reduced clarity of speech   Cognition Arousal: Lethargic Behavior During Therapy: Flat affect Cognition: Difficult to assess Difficult to assess due to: Impaired communication           OT - Cognition Comments: aphasia                 Following commands: Impaired Following commands impaired: Follows one step commands with increased time      Cueing   Cueing Techniques: Verbal cues, Gestural cues, Tactile cues  Exercises Exercises: Other exercises Other Exercises Other Exercises: pt sat EOB for session, focusing on midline orientation, dynamic reaching, postural control and lateral leaning.  requires max-total a for all upright positioning. narrow BOS requiring MAX A to reposition LE for balance. +3 to laterally scoot with use of bed pads       General Comments RUE edema; left positioned and elevated on pillows. Spouse voicing multiple complaints and concerns re: hospital stay and requesting to speak to MD. Charge RN notified, secure chat to MD.    Pertinent Vitals/ Pain       Pain Assessment Pain Assessment: No/denies pain   Frequency  Min 2X/week        Progress Toward Goals  OT Goals(current goals can now be found in the care plan section)  Progress towards OT goals: Progressing  toward goals     Plan      Co-evaluation    PT/OT/SLP Co-Evaluation/Treatment: Yes Reason for Co-Treatment: For patient/therapist safety;To address functional/ADL transfers;Complexity of the patient's impairments (multi-system involvement) PT goals addressed during session: Mobility/safety with mobility;Balance;Proper use of DME OT goals addressed during session: ADL's and self-care      AM-PAC OT 6 Clicks Daily Activity     Outcome Measure   Help from another person eating meals?: A Lot Help from another person taking care of personal grooming?: A Lot Help from another person toileting, which includes using toliet, bedpan, or urinal?: A Lot Help from another person bathing (including washing, rinsing, drying)?: A Lot Help from another person to put on and taking off regular upper body clothing?: A Lot Help from another person to put on and taking off regular lower body clothing?: A Lot 6 Click Score: 12    End of Session    OT Visit Diagnosis: Other abnormalities of gait and mobility (R26.89)   Activity Tolerance Patient limited by lethargy   Patient Left in bed;with call bell/phone within reach;with bed alarm set;with nursing/sitter in room;with family/visitor present   Nurse Communication Mobility status        Time: 8856-8787 OT Time Calculation (min): 29 min  Charges: OT General Charges $OT Visit: 1 Visit OT Treatments $Therapeutic Activity: 8-22 mins  [name], OTR/L  06/27/24, 12:53 PM

## 2024-06-27 NOTE — TOC Progression Note (Incomplete)
 Transition of Care [facility]) - Progression Note    Patient Details  Name: HAADI SANTELLAN MRN: [id number] Date of Birth: 1961-03-11  [facility] CM/SW Contact  Daved JONETTA Hamilton, RN Phone Number: 06/27/2024, 3:44 PM  Clinical Narrative:       Expected Discharge Plan: Home w Home Health Services Barriers to Discharge: Continued Medical Work up               Expected Discharge Plan and Services   Discharge Planning Services: CM Consult Post Acute Care Choice: Durable Medical Equipment, Home Health Living arrangements for the past 2 months: Single Family Home                 DME Arranged: Hospital bed, Other see comment Lajean Lift) DME Agency: [employer]                   Social Drivers of Health (SDOH) Interventions SDOH Screenings   Food Insecurity: No Food Insecurity (06/17/2024)  Housing: Low Risk (06/17/2024)  Transportation Needs: No Transportation Needs (06/17/2024)  Utilities: Not At Risk (06/17/2024)  Financial Resource Strain: Low Risk  (04/29/2024)   Received from [facility]  Tobacco Use: Low Risk (06/17/2024)  Health Literacy: Adequate Health Literacy (05/15/2024)   Received from [facility]    Readmission Risk Interventions     No data to display

## 2024-06-27 NOTE — Plan of Care (Signed)
   Problem: Coping: Goal: Level of anxiety will decrease Outcome: Progressing

## 2024-06-27 NOTE — TOC Progression Note (Incomplete)
 Transition of Care [facility]) - Progression Note    Patient Details  Name: Troy Morse MRN: [id number] Date of Birth: 1960/08/02  Transition of [facility]) CM/SW Contact  Daved JONETTA Hamilton, RN Phone Number: 06/27/2024, 2:12 PM  Clinical Narrative:     This CM reached to patient's insurance [employer] Medicare/BCBS Medicare at [phone] regarding the status of patient's urgent appeal filed on 06/24/2024.     Expected Discharge Plan: Home w Home Health Services Barriers to Discharge: Continued Medical Work up               Expected Discharge Plan and Services   Discharge Planning Services: CM Consult Post Acute Care Choice: Durable Medical Equipment, Home Health Living arrangements for the past 2 months: Single Family Home                 DME Arranged: Hospital bed, Other see comment Lajean Lift) DME Agency: [employer]                   Social Drivers of Health (SDOH) Interventions SDOH Screenings   Food Insecurity: No Food Insecurity (06/17/2024)  Housing: Low Risk (06/17/2024)  Transportation Needs: No Transportation Needs (06/17/2024)  Utilities: Not At Risk (06/17/2024)  Financial Resource Strain: Low Risk  (04/29/2024)   Received from [facility]  Tobacco Use: Low Risk (06/17/2024)  Health Literacy: Adequate Health Literacy (05/15/2024)   Received from [facility]    Readmission Risk Interventions     No data to display

## 2024-06-27 NOTE — Discharge Summary (Addendum)
 " Physician Discharge Summary   Patient: Troy Morse MRN: [id number] DOB: 02-Nov-1960  Admit date:     06/17/2024  Discharge date: 06/27/24  Discharge Physician: Genell SAUNDERS Azyiah Bo   PCP: [name], MD   Recommendations at discharge:    Follow up with his urologist, Neurology and PCP as outpatient.   Discharge Diagnoses: Principal Problem:   Complicated UTI (urinary tract infection) Active Problems:   Type 2 diabetes mellitus with peripheral neuropathy (HCC)   Essential hypertension   CAD (coronary artery disease)   CKD (chronic kidney disease) stage 3, GFR 30-59 ml/min (HCC)   H/O: stroke  Resolved Problems:   * No resolved hospital problems. [facility] Course: Hospital course / significant events:   HPI: Troy Morse is a 64 y.o. year old male with medical history of hypertension, hyperlipidemia, type 2 diabetes, history of CVA, history of PE on Eliquis  who presented to the ED with increasing weakness and somnolence.  Patient with poor functional status who has chronic foley catheter and later treatment for UTI.  His last catheter was placed 6 weeks ago. Per wife, he was supposed to have urology appointment to remove catheter but cancelled due to weakness. He denies any fevers or chills.   01/06: On arrival to the ED patient was noted to be HDS. CBC without leukocytosis, CMP with moderate hyperglycemia but.  Respiratory panel negative for COVID, flu, RSV.  UA consistent with urinary tract infection.  Urine culture pending.  Patient given 1 dose of ceftriaxone , appears last culture showed E. coli.  Admitted to hospitalist.  01/07: ongoing confusion/encephalopathic. UCx again (+)Ecoli, pending susceptibilities. Aspiration event this morning, question vomiting. NPO pending SLP eval, continue NS fluids. Foley exchanged today  01/08: much more alert / baseline. UCx (+)EColi multiple resistances, d/w ID pharmacist - plan for gentamicin dose and DC home however pt's wife requests  he stays here for void trial, she would like to establish him w/ [facility] urologist instead of [facility]. D/w urology - will see pt, for now can continue w/ ceftriaxone  01/09: d/w urology, recommendation is for outpatient void trial but there is concern re transportation and mobility issues - d/w urology, will plan for void trial here Monday, will have to be Monday given limited availability of urology coverage through the weekend in case of adverse outcome. I attempted to call Mrs. Apostol to relay this plan this morning at 09:50 but no answer, will attempt again later. PT/OT also to see, given concern for pt weaker than when he was DC from rehab at [facility] at which point he was ambulating.  01/10-01/11: await void trial Mon 01/12 01/12: successful void trial today. Awaiting SNF rehab - needs peer-to-peer 01/13: peer-to-peer completed this morning, I spoke w/ a Dr. Segel. Rehab was denied - stated reason being that the pt appears to be at his baseline now bed-bound which is same as his level of function prior to developing the UTI / prior to the reason for this hospitalization. Pt's wife is appealing the denial. PT/OT to reassess today / continue work w/ patient as able.    Consultants:  Urology  Procedures/Surgeries: none      ASSESSMENT & PLAN:   Urinary Tract Infection UCX (+)Ecoli multiple resistances Altered mental status / metabolic encephalopathy d/t UTI - resolved  UTI POA associated w/ catheter which was also POA  IV Ceftriaxone , option for IV gentamicin x1 but since he will be in the hospital anyway will continue ceftriaxone  x7 days -  finish 01/12   Urinary retention - improved/resolved  Urology following Given pt's urinary retention after previous hospitalization, he has had foley catheter. Plan was to follow up with urology on 06/16/24 for voiding trial but did not go d/t weakness. Foley exchanged here 01/07 beginning of this hospital stay. Void trial 06/23/2024 successful, per urology ok  to discharge without Foley  Continue finasteride     Type 2 diabetes w/ hyperglycemia  Uncontrolled diabetes may be leading to recurrent UTI.  Eating better, resumed achs and basal insulin  Holding home metformin .   Continue home gabapentin  for neuropathy.   Aspiration event witnessed by RN 06/18/24 Potential aspiration pneumonitis but no symptoms Aspiration d/t encephalopathy, which has resolved Given CVA remains aspiration risk especially w/ other causes for weakness (currently, infection) Issue has resolved now that pt is more alert, he is tolerating diet  SLP eval --> regular diet  RUE swelling w/o tenderness or pitting edema - improving w/ elevation Consistent w/ Eliquis  - unlikely clot Suspect dependent edema pt keeps this arm tight against his side, also IV infiltrate - prop up in a slightly winged out abduction using pillows on eliquis  unlikely DVT and edema is slowly improving  Hx CVA w/ residual deficits  See [facility] admission 10/2023 Of note, PFO on echo, per neurology this was NOT contributory to CVA, see consult note 11/02/2023 Secondary prevention: Statin, eliquis , Glc control, BP control   Hx PE 11/2023 eliquis   Essential Hypertension   Not on any pharmacotherapy  History of hypotension.   continue to monitor given active infection.   Holding home midodrine and BP has been WNL   Hyperlipidemia/CAD:  Continue home statin.   CKD 3A: At baseline.   Renally dose medications as needed, avoid nephrotoxic agents. Monitor BMP   GAD Continue home clonazepam , SSRI    GERD:  Continue home PPI   Hx of PE: on elquis,  continue home eliquis .    Debility: 2/2 to strokes since July.  Bed bound prior to admission here.  need continued PT/OT  he gets home PT/OT Insurance has denied rehab - see hospital course  Of note, he was ambulatory w. [facility] on discharge from [facility] rehab 04/2024   Diarrhea: Appears to be chronic.  C. Diff negative.  GI panel also  negative Monitor      Overweight based on BMI: Body mass index is 27.39 kg/m.SABRA Significantly low or high BMI is associated with higher medical risk.  Underweight - under 18  overweight - 25 to 29 obese - 30 or more Class 1 obesity: BMI of 30.0 to 34 Class 2 obesity: BMI of 35.0 to 39 Class 3 obesity: BMI of 40.0 to 49 Super Morbid Obesity: BMI 50-59 Super-super Morbid Obesity: BMI 60+ Healthy nutrition and physical activity advised as adjunct to other disease management and risk reduction treatments    DVT prophylaxis: eliquis  IV fluids: none Nutrition: regular (S/p SLP eval) Central lines / other devices: none  Code Status: DNR ACP documentation reviewed:  none on file in [facility]  [facility] needs:  SNF rehab placement / home health  Medical barriers to dispo: none      Assessment and Plan: No notes have been filed under this hospital service. Service: Hospitalist        Consultants: Urology  Procedures performed: Foley   Disposition: Skilled nursing facility Diet recommendation:  Regular diet DISCHARGE MEDICATION: Allergies as of 06/27/2024   No Active Allergies      Medication List     STOP  taking these medications    Jardiance  10 MG Tabs tablet Generic drug: empagliflozin    midodrine 2.5 MG tablet Commonly known as: PROAMATINE   Tresiba FlexTouch 200 UNIT/ML FlexTouch Pen Generic drug: insulin  degludec       TAKE these medications    acetaminophen  500 MG tablet Commonly known as: TYLENOL  Take 1,000 mg by mouth every 8 (eight) hours as needed for mild pain (pain score 1-3).   aspirin  EC 81 MG tablet Take 81 mg by mouth daily.   atorvastatin  40 MG tablet Commonly known as: LIPITOR Take 1 tablet (40 mg total) by mouth daily.   brimonidine  0.2 % ophthalmic solution Commonly known as: ALPHAGAN  Place 1 drop into both eyes 2 (two) times a day.   calcium -vitamin D 500-5 MG-MCG tablet Commonly known as: OSCAL WITH D Take 1 tablet by mouth  daily with breakfast.   clonazePAM  0.5 MG tablet Commonly known as: KLONOPIN  Take 1 tablet (0.5 mg total) by mouth daily.   cyanocobalamin  500 MCG tablet Commonly known as: VITAMIN B12 Take 1,000 mcg by mouth daily.   Eliquis  5 MG Tabs tablet Generic drug: apixaban  Take 5 mg by mouth 2 (two) times daily.   finasteride  5 MG tablet Commonly known as: PROSCAR  Take 5 mg by mouth daily.   gabapentin  300 MG capsule Commonly known as: NEURONTIN  Take 300 mg by mouth every evening.   icosapent  Ethyl 1 g capsule Commonly known as: VASCEPA  Take 2 g by mouth daily.   insulin  aspart 100 UNIT/ML injection Commonly known as: novoLOG  Inject 7 Units into the skin 3 (three) times daily with meals.   NovoLOG  FlexPen 100 UNIT/ML FlexPen Generic drug: insulin  aspart Inject 2-10 Units into the skin 3 (three) times daily with meals. No coverage if BS less than 250. 250-300- give 2 units, 301-350-4 units, 351-400-6 units, >401 give 6 units and call MD.   insulin  glargine 100 UNIT/ML injection Commonly known as: LANTUS  Inject 0.3 mLs (30 Units total) into the skin daily. 22-24 What changed: how much to take   latanoprost  0.005 % ophthalmic solution Commonly known as: XALATAN  Place 1 drop into both eyes at bedtime.   metFORMIN  500 MG tablet Commonly known as: GLUCOPHAGE  Take 1,000 mg by mouth at bedtime.   metoprolol  tartrate 25 MG tablet Commonly known as: LOPRESSOR  Take 1 tablet (25 mg total) by mouth 2 (two) times daily.   NONFORMULARY OR COMPOUNDED ITEM Trimix (30/1/10)-(Pap/Phent/PGE)  Dosage: Inject 1ml per injection  Prefilled Syringe # Test Dose 3ml vial Vial 5ml   Qty 5ml Refills 3  [facility] [phone] Fax [phone]   pantoprazole  40 MG tablet Commonly known as: PROTONIX  Take 40 mg by mouth daily.   sertraline  100 MG tablet Commonly known as: ZOLOFT  Take 150 mg by mouth daily.               Durable Medical Equipment  (From admission,  onward)           Start     Ordered   06/18/24 1541  For home use only DME Hospital bed  Once       Question Answer Comment  Length of Need Lifetime   Patient has (list medical condition): stroke   The above medical condition requires: Patient requires the ability to reposition frequently   Head must be elevated greater than: 30 degrees   Bed type Semi-electric   Hoyer Lift Yes      06/18/24 1542  Contact information for after-discharge care     Destination     [facility] .   Service: Skilled Nursing Contact information: [address] [place]  [address] [phone]             Home Medical Care     [facility]) .   Service: Home Health Services Contact information: [address] [place]  [address] [phone]                    Discharge Exam: Fredricka Weights   06/25/24 0404 06/26/24 0500 06/27/24 0444  Weight: 93.4 kg 88 kg 88.4 kg    Constitutional: NAD, alert and awake, following commands HEENT: conjunctivae and lids normal, EOMI CV: No cyanosis.   RESP: normal respiratory effort Neuro: Moving all extremities, gen weakness, Alert and orientated times 3  Condition at discharge: stable  The results of significant diagnostics from this hospitalization (including imaging, microbiology, ancillary and laboratory) are listed below for reference.   Imaging Studies: DG Chest Port 1 View Result Date: 06/18/2024 CLINICAL DATA:  Aspiration into the airway. EXAM: PORTABLE CHEST 1 VIEW COMPARISON:  06/17/2024 FINDINGS: Patchy and linear density in the left lower lobe. The remainder of the lungs are clear with normal vascularity. Normal-sized heart. Aortic arch atheromatous calcifications. Unremarkable bones. IMPRESSION: Left lower lobe atelectasis and possible aspiration pneumonitis. Electronically Signed   By: Elspeth Bathe M.D.   On: 06/18/2024 15:17    DG Chest Portable 1 View Result Date: 06/17/2024 CLINICAL DATA:  Weakness. EXAM: PORTABLE CHEST 1 VIEW COMPARISON:  01/06/2018. FINDINGS: The heart size and mediastinal contours are within normal limits. Low lung volumes with bronchovascular crowding. No focal consolidation, sizeable pleural effusion, or pneumothorax. No acute osseous abnormality. IMPRESSION: Low lung volumes.  No acute cardiopulmonary findings. Electronically Signed   By: Harrietta Sherry M.D.   On: 06/17/2024 13:56    Microbiology: Results for orders placed or performed during the hospital encounter of 06/17/24  Resp panel by RT-PCR (RSV, Flu A&B, Covid) Anterior Nasal Swab     Status: None   Collection Time: 06/17/24  2:05 PM   Specimen: Anterior Nasal Swab  Result Value Ref Range Status   SARS Coronavirus 2 by RT PCR NEGATIVE NEGATIVE Final    Comment: (NOTE) SARS-CoV-2 target nucleic acids are NOT DETECTED.  The SARS-CoV-2 RNA is generally detectable in upper respiratory specimens during the acute phase of infection. The lowest concentration of SARS-CoV-2 viral copies this assay can detect is 138 copies/mL. A negative result does not preclude SARS-Cov-2 infection and should not be used as the sole basis for treatment or other patient management decisions. A negative result may occur with  improper specimen collection/handling, submission of specimen other than nasopharyngeal swab, presence of viral mutation(s) within the areas targeted by this assay, and inadequate number of viral copies(<138 copies/mL). A negative result must be combined with clinical observations, patient history, and epidemiological information. The expected result is Negative.  Fact Sheet for Patients:  bloggercourse.com  Fact Sheet for Healthcare Providers:  seriousbroker.it  This test is no t yet approved or cleared by the [place]  FDA and  has been authorized for detection and/or  diagnosis of SARS-CoV-2 by FDA under an Emergency Use Authorization (EUA). This EUA will remain  in effect (meaning this test can be used) for the duration of the COVID-19 declaration under Section 564(b)(1) of the Act, 21 U.S.C.section 360bbb-3(b)(1), unless the  authorization is terminated  or revoked sooner.       Influenza A by PCR NEGATIVE NEGATIVE Final   Influenza B by PCR NEGATIVE NEGATIVE Final    Comment: (NOTE) The Xpert Xpress SARS-CoV-2/FLU/RSV plus assay is intended as an aid in the diagnosis of influenza from Nasopharyngeal swab specimens and should not be used as a sole basis for treatment. Nasal washings and aspirates are unacceptable for Xpert Xpress SARS-CoV-2/FLU/RSV testing.  Fact Sheet for Patients: bloggercourse.com  Fact Sheet for Healthcare Providers: seriousbroker.it  This test is not yet approved or cleared by the [place]  FDA and has been authorized for detection and/or diagnosis of SARS-CoV-2 by FDA under an Emergency Use Authorization (EUA). This EUA will remain in effect (meaning this test can be used) for the duration of the COVID-19 declaration under Section 564(b)(1) of the Act, 21 U.S.C. section 360bbb-3(b)(1), unless the authorization is terminated or revoked.     Resp Syncytial Virus by PCR NEGATIVE NEGATIVE Final    Comment: (NOTE) Fact Sheet for Patients: bloggercourse.com  Fact Sheet for Healthcare Providers: seriousbroker.it  This test is not yet approved or cleared by the [place]  FDA and has been authorized for detection and/or diagnosis of SARS-CoV-2 by FDA under an Emergency Use Authorization (EUA). This EUA will remain in effect (meaning this test can be used) for the duration of the COVID-19 declaration under Section 564(b)(1) of the Act, 21 U.S.C. section 360bbb-3(b)(1), unless the authorization is terminated  or revoked.  Performed at [facility], [address], [place], [place] [address]   Urine Culture     Status: Abnormal   Collection Time: 06/17/24  2:39 PM   Specimen: Urine, Catheterized  Result Value Ref Range Status   Specimen Description   Final    URINE, CATHETERIZED Performed at [facility], [address] [place], [place] [address]    Special Requests   Final    NONE Performed at [facility], [address] [place], [place] [address]    Culture >=100,000 COLONIES/mL ESCHERICHIA COLI (A)  Final   Report Status 06/19/2024 FINAL  Final   Organism ID, Bacteria ESCHERICHIA COLI (A)  Final      Susceptibility   Escherichia coli - MIC*    AMPICILLIN >=32 RESISTANT Resistant     CEFAZOLIN  (URINE) Value in next row Resistant      >=32 RESISTANTThis is a modified FDA-approved test that has been validated and its performance characteristics determined by the reporting laboratory.  This laboratory is certified under the Clinical Laboratory Improvement Amendments CLIA as qualified to perform high complexity clinical laboratory testing.    CEFEPIME  Value in next row Sensitive      >=32 RESISTANTThis is a modified FDA-approved test that has been validated and its performance characteristics determined by the reporting laboratory.  This laboratory is certified under the Clinical Laboratory Improvement Amendments CLIA as qualified to perform high complexity clinical laboratory testing.    ERTAPENEM Value in next row Sensitive      >=32 RESISTANTThis is a modified FDA-approved test that has been validated and its performance characteristics determined by the reporting laboratory.  This laboratory is certified under the Clinical Laboratory Improvement Amendments CLIA as qualified to perform high complexity clinical laboratory testing.    CEFTRIAXONE  Value in next row Sensitive      >=32 RESISTANTThis is a modified FDA-approved test that has been validated and its  performance characteristics determined by the reporting laboratory.  This laboratory is certified  under the Clinical Laboratory Improvement Amendments CLIA as qualified to perform high complexity clinical laboratory testing.    CIPROFLOXACIN Value in next row Intermediate      >=32 RESISTANTThis is a modified FDA-approved test that has been validated and its performance characteristics determined by the reporting laboratory.  This laboratory is certified under the Clinical Laboratory Improvement Amendments CLIA as qualified to perform high complexity clinical laboratory testing.    GENTAMICIN Value in next row Sensitive      >=32 RESISTANTThis is a modified FDA-approved test that has been validated and its performance characteristics determined by the reporting laboratory.  This laboratory is certified under the Clinical Laboratory Improvement Amendments CLIA as qualified to perform high complexity clinical laboratory testing.    NITROFURANTOIN Value in next row Resistant      >=32 RESISTANTThis is a modified FDA-approved test that has been validated and its performance characteristics determined by the reporting laboratory.  This laboratory is certified under the Clinical Laboratory Improvement Amendments CLIA as qualified to perform high complexity clinical laboratory testing.    TRIMETH/SULFA Value in next row Resistant      >=32 RESISTANTThis is a modified FDA-approved test that has been validated and its performance characteristics determined by the reporting laboratory.  This laboratory is certified under the Clinical Laboratory Improvement Amendments CLIA as qualified to perform high complexity clinical laboratory testing.    AMPICILLIN/SULBACTAM Value in next row Resistant      >=32 RESISTANTThis is a modified FDA-approved test that has been validated and its performance characteristics determined by the reporting laboratory.  This laboratory is certified under the Clinical Laboratory Improvement  Amendments CLIA as qualified to perform high complexity clinical laboratory testing.    PIP/TAZO Value in next row Sensitive      8 SENSITIVEThis is a modified FDA-approved test that has been validated and its performance characteristics determined by the reporting laboratory.  This laboratory is certified under the Clinical Laboratory Improvement Amendments CLIA as qualified to perform high complexity clinical laboratory testing.    MEROPENEM Value in next row Sensitive      8 SENSITIVEThis is a modified FDA-approved test that has been validated and its performance characteristics determined by the reporting laboratory.  This laboratory is certified under the Clinical Laboratory Improvement Amendments CLIA as qualified to perform high complexity clinical laboratory testing.    * >=100,000 COLONIES/mL ESCHERICHIA COLI  C Difficile Quick Screen w PCR reflex     Status: None   Collection Time: 06/17/24  2:39 PM   Specimen: STOOL  Result Value Ref Range Status   C Diff antigen NEGATIVE NEGATIVE Final   C Diff toxin NEGATIVE NEGATIVE Final   C Diff interpretation No C. difficile detected.  Final    Comment: Performed at [facility], [address] [place], [place] [address]  Gastrointestinal Panel by PCR , Stool     Status: None   Collection Time: 06/17/24  2:39 PM   Specimen: Stool  Result Value Ref Range Status   Campylobacter species NOT DETECTED NOT DETECTED Final   Plesimonas shigelloides NOT DETECTED NOT DETECTED Final   Salmonella species NOT DETECTED NOT DETECTED Final   Yersinia enterocolitica NOT DETECTED NOT DETECTED Final   Vibrio species NOT DETECTED NOT DETECTED Final   Vibrio cholerae NOT DETECTED NOT DETECTED Final   Enteroaggregative E coli (EAEC) NOT DETECTED NOT DETECTED Final   Enteropathogenic E coli (EPEC) NOT DETECTED NOT DETECTED Final   Enterotoxigenic E coli (ETEC) NOT DETECTED  NOT DETECTED Final   Shiga like toxin producing E coli (STEC) NOT DETECTED NOT  DETECTED Final   Shigella/Enteroinvasive E coli (EIEC) NOT DETECTED NOT DETECTED Final   Cryptosporidium NOT DETECTED NOT DETECTED Final   Cyclospora cayetanensis NOT DETECTED NOT DETECTED Final   Entamoeba histolytica NOT DETECTED NOT DETECTED Final   Giardia lamblia NOT DETECTED NOT DETECTED Final   Adenovirus F40/41 NOT DETECTED NOT DETECTED Final   Astrovirus NOT DETECTED NOT DETECTED Final   Norovirus GI/GII NOT DETECTED NOT DETECTED Final   Rotavirus A NOT DETECTED NOT DETECTED Final   Sapovirus (I, II, IV, and V) NOT DETECTED NOT DETECTED Final    Comment: Performed at [facility], [address] [place], [place] [address]    Labs: CBC: Recent Labs  Lab 06/23/24 0605  WBC 8.8  HGB 12.4*  HCT 36.6*  MCV 93.8  PLT 195   Basic Metabolic Panel: Recent Labs  Lab 06/23/24 0605  NA 136  K 4.5  CL 98  CO2 29  GLUCOSE 232*  BUN 21  CREATININE 1.17  CALCIUM  10.1   Liver Function Tests: No results for input(s): AST, ALT, ALKPHOS, BILITOT, PROT, ALBUMIN in the last 168 hours. CBG: Recent Labs  Lab 06/26/24 1213 06/26/24 1658 06/26/24 2112 06/27/24 0856 06/27/24 1158  GLUCAP 203* 152* 107* 146* 161*    Discharge recommendations: Continue to monitor and titrate insulin  sliding scale, Will need ongoing follow up and encourage oral intake including fluids to prevent dehydration.   Discharge time spent: greater than 30 minutes.  Signed: Genell JONELLE Overcast, MD [facility] 06/27/2024 "

## 2024-06-27 NOTE — Progress Notes (Signed)
 Physical Therapy Treatment Patient Details Name: Troy Morse MRN: [id number] DOB: 1960/08/26 Today's Date: 06/27/2024   History of Present Illness Pt is a 64 y/o M admitted on 06/17/24 after presenting with c/o increasing weakness & somnolence. Pt is being treated for complicated UTI. PMH: HTN, HLD, DM2, CVA, PE, chronic renal disease    PT Comments  Patient received sleeping in bed, more lethargic this session than prior sessions. Patient requires total +2 assist for bed mobility. He is requiring max A for sitting balance with heavy posterior leaning and requires UE and LE placement to improve seated balance. Patient remained lethargic throughout session, with decreased ability to participate with trunk control/exercises. Patient will continue to benefit from skilled PT to improve functional independence.      If plan is discharge home, recommend the following: Two people to help with walking and/or transfers;Two people to help with bathing/dressing/bathroom   Can travel by private vehicle     No  Equipment Recommendations  Hoyer lift;Hospital bed    Recommendations for Other Services       Precautions / Restrictions Precautions Precautions: Fall Recall of Precautions/Restrictions: Impaired Restrictions Weight Bearing Restrictions Per Provider Order: No     Mobility  Bed Mobility Overal bed mobility: Needs Assistance Bed Mobility: Rolling, Supine to Sit, Sit to Supine Rolling: Max assist   Supine to sit: Max assist, +2 for physical assistance Sit to supine: Max assist, +2 for physical assistance   General bed mobility comments: Requires significant +2 TOTAL assist for all bed mobility. Stiffness in all extremeties, heavy posterior push and L lateral lean.    Transfers                   General transfer comment: did not attempt this date. Poor sitting balance and poor level of alertness    Ambulation/Gait                   Stairs              Wheelchair Mobility     Tilt Bed    Modified Rankin (Stroke Patients Only)       Balance Overall balance assessment: Needs assistance Sitting-balance support: Feet supported, Single extremity supported Sitting balance-Leahy Scale: Poor Sitting balance - Comments: flucuates MIN-TOTAL A. Pt requires OT to sit behind in tall kneeling for trunk support, heavy posterior push Postural control: Left lateral lean, Posterior lean                                  Communication Communication Communication: Impaired Factors Affecting Communication: Difficulty expressing self;Reduced clarity of speech  Cognition Arousal: Lethargic Behavior During Therapy: Flat affect   PT - Cognitive impairments: No apparent impairments                       PT - Cognition Comments: Answers single word responses to questions appropriately, increased time needed to respond Following commands: Impaired Following commands impaired: Follows one step commands with increased time    Cueing Cueing Techniques: Verbal cues, Gestural cues, Tactile cues  Exercises      General Comments General comments (skin integrity, edema, etc.): RUE edema; left positioned and elevated on pillows. Spouse voicing multiple complaints and concerns re: hospital stay and requesting to speak to MD. Charge RN notified, secure chat to MD.      Pertinent Vitals/Pain Pain Assessment Pain Assessment:  No/denies pain Pain Intervention(s): Monitored during session, Repositioned    Home Living                          Prior Function            PT Goals (current goals can now be found in the care plan section) Acute Rehab PT Goals Patient Stated Goal: get better PT Goal Formulation: With patient/family Time For Goal Achievement: 07/02/24 Potential to Achieve Goals: Fair Progress towards PT goals: Not progressing toward goals - comment (continues to require +2 max to total A for bed  mobility)    Frequency    Min 2X/week      PT Plan      Co-evaluation PT/OT/SLP Co-Evaluation/Treatment: Yes Reason for Co-Treatment: For patient/therapist safety;Necessary to address cognition/behavior during functional activity;To address functional/ADL transfers PT goals addressed during session: Mobility/safety with mobility;Balance OT goals addressed during session: ADL's and self-care      AM-PAC PT 6 Clicks Mobility   Outcome Measure  Help needed turning from your back to your side while in a flat bed without using bedrails?: Total Help needed moving from lying on your back to sitting on the side of a flat bed without using bedrails?: Total Help needed moving to and from a bed to a chair (including a wheelchair)?: Total Help needed standing up from a chair using your arms (e.g., wheelchair or bedside chair)?: Total Help needed to walk in hospital room?: Total Help needed climbing 3-5 steps with a railing? : Total 6 Click Score: 6    End of Session   Activity Tolerance: Patient limited by fatigue Patient left: in bed;with call bell/phone within reach;with family/visitor present;with bed alarm set Nurse Communication: Mobility status PT Visit Diagnosis: Other abnormalities of gait and mobility (R26.89);Muscle weakness (generalized) (M62.81);Hemiplegia and hemiparesis Hemiplegia - Right/Left: Right Hemiplegia - caused by: Cerebral infarction     Time: 8856-8784 PT Time Calculation (min) (ACUTE ONLY): 32 min  Charges:    $Therapeutic Activity: 8-22 mins PT General Charges $$ ACUTE PT VISIT: 1 Visit                     Nikolas Casher, PT, GCS 06/27/24,1:18 PM

## 2024-06-27 NOTE — Plan of Care (Signed)
